# Patient Record
Sex: Male | Born: 1940 | Race: White | Hispanic: No | Marital: Married | Smoking: Never smoker
Health system: Southern US, Community
[De-identification: ages and names within clinical notes are randomized; demographics above are authoritative.]

## PROBLEM LIST (undated history)

## (undated) DIAGNOSIS — C4490 Unspecified malignant neoplasm of skin, unspecified: Secondary | ICD-10-CM

## (undated) DIAGNOSIS — E7800 Pure hypercholesterolemia, unspecified: Secondary | ICD-10-CM

## (undated) DIAGNOSIS — C189 Malignant neoplasm of colon, unspecified: Secondary | ICD-10-CM

## (undated) DIAGNOSIS — R195 Other fecal abnormalities: Secondary | ICD-10-CM

## (undated) DIAGNOSIS — I10 Essential (primary) hypertension: Secondary | ICD-10-CM

## (undated) DIAGNOSIS — E119 Type 2 diabetes mellitus without complications: Secondary | ICD-10-CM

## (undated) DIAGNOSIS — K219 Gastro-esophageal reflux disease without esophagitis: Secondary | ICD-10-CM

## (undated) HISTORY — PX: CARDIAC SURGERY: SHX584

## (undated) HISTORY — DX: Type 2 diabetes mellitus without complications: E11.9

## (undated) HISTORY — PX: CATARACT EXTRACTION: SUR2

## (undated) HISTORY — PX: TONSILLECTOMY: SUR1361

## (undated) HISTORY — PX: OTHER SURGICAL HISTORY: SHX169

## (undated) HISTORY — PX: SKIN CANCER EXCISION: SHX779

---

## 2000-11-06 HISTORY — PX: CORONARY ARTERY BYPASS GRAFT: SHX141

## 2011-05-28 ENCOUNTER — Emergency Department (HOSPITAL_COMMUNITY)
Admission: EM | Admit: 2011-05-28 | Discharge: 2011-05-28 | Disposition: A | Discharge status: Home / Self Care | Payer: Medicare Other | Attending: Emergency Medicine | Admitting: Emergency Medicine

## 2011-05-28 ENCOUNTER — Encounter: Payer: Self-pay | Admitting: *Deleted

## 2011-05-28 DIAGNOSIS — K219 Gastro-esophageal reflux disease without esophagitis: Secondary | ICD-10-CM | POA: Insufficient documentation

## 2011-05-28 DIAGNOSIS — I10 Essential (primary) hypertension: Secondary | ICD-10-CM | POA: Insufficient documentation

## 2011-05-28 DIAGNOSIS — N390 Urinary tract infection, site not specified: Secondary | ICD-10-CM | POA: Insufficient documentation

## 2011-05-28 DIAGNOSIS — Z85828 Personal history of other malignant neoplasm of skin: Secondary | ICD-10-CM | POA: Insufficient documentation

## 2011-05-28 DIAGNOSIS — Z794 Long term (current) use of insulin: Secondary | ICD-10-CM | POA: Insufficient documentation

## 2011-05-28 DIAGNOSIS — Z7982 Long term (current) use of aspirin: Secondary | ICD-10-CM | POA: Insufficient documentation

## 2011-05-28 DIAGNOSIS — C4490 Unspecified malignant neoplasm of skin, unspecified: Secondary | ICD-10-CM | POA: Insufficient documentation

## 2011-05-28 DIAGNOSIS — E7800 Pure hypercholesterolemia, unspecified: Secondary | ICD-10-CM | POA: Insufficient documentation

## 2011-05-28 DIAGNOSIS — E119 Type 2 diabetes mellitus without complications: Secondary | ICD-10-CM | POA: Insufficient documentation

## 2011-05-28 HISTORY — DX: Essential (primary) hypertension: I10

## 2011-05-28 HISTORY — DX: Pure hypercholesterolemia, unspecified: E78.00

## 2011-05-28 HISTORY — DX: Gastro-esophageal reflux disease without esophagitis: K21.9

## 2011-05-28 HISTORY — DX: Unspecified malignant neoplasm of skin, unspecified: C44.90

## 2011-05-28 LAB — URINALYSIS, ROUTINE W REFLEX MICROSCOPIC
Glucose, UA: 250 mg/dL — AB
Nitrite: POSITIVE — AB
Protein, ur: 100 mg/dL — AB
Urobilinogen, UA: 4 mg/dL — ABNORMAL HIGH (ref 0.0–1.0)

## 2011-05-28 LAB — GLUCOSE, CAPILLARY: Glucose-Capillary: 228 mg/dL — ABNORMAL HIGH (ref 70–99)

## 2011-05-28 LAB — URINE MICROSCOPIC-ADD ON

## 2011-05-28 MED ORDER — CIPROFLOXACIN HCL 250 MG PO TABS
500.0000 mg | ORAL_TABLET | Freq: Once | ORAL | Status: AC
  Administered 2011-05-28: 500 mg via ORAL
  Filled 2011-05-28: qty 2

## 2011-05-28 MED ORDER — CIPROFLOXACIN HCL 500 MG PO TABS: 500.0000 mg | ORAL_TABLET | Freq: Two times a day (BID) | ORAL | Status: AC

## 2011-05-28 NOTE — ED Notes (Signed)
Urinary frequency with lower abd pain that started yesterday afternoon.  Denies burning with urination/v/n.

## 2011-05-28 NOTE — ED Provider Notes (Signed)
History     Chief Complaint  Patient presents with  . Urinary Frequency   HPI Comments: Vision presents with gradual onset of urinary frequency over 3 days. This has been consistent over 3 days, gradually worsening, associated with mild suprapubic tenderness yesterday. Nothing makes this better or worse. He has no associated flank pain, back pain, nausea, fevers. He does have history of prior urine infections in the past. He has no symptoms at this time.  Patient is a 70 y.o. male presenting with frequency. The history is provided by the patient and the spouse.  Urinary Frequency    Past Medical History  Diagnosis Date  . Hypertension   . High cholesterol   . Diabetes mellitus   . Acid reflux   . Skin cancer     to nose    Past Surgical History  Procedure Date  . Cardiac surgery   . Tonsillectomy     No family history on file.  History  Substance Use Topics  . Smoking status: Never Smoker   . Smokeless tobacco: Not on file  . Alcohol Use: No      Review of Systems  Constitutional: Negative for fever and chills.  Genitourinary: Positive for frequency. Negative for dysuria, hematuria, flank pain, discharge, penile swelling, scrotal swelling, penile pain and testicular pain.    Physical Exam  BP 136/52  Pulse 73  Temp(Src) 99 F (37.2 C) (Oral)  Resp 20  Ht 5\' 10"  (1.778 m)  Wt 198 lb (89.812 kg)  BMI 28.41 kg/m2  SpO2 99%  Physical Exam  Constitutional: He appears well-developed and well-nourished. No distress.  HENT:  Head: Normocephalic and atraumatic.  Mouth/Throat: Oropharynx is clear and moist.  Eyes: Conjunctivae are normal. No scleral icterus.  Cardiovascular: Normal rate, regular rhythm and normal heart sounds.   Pulmonary/Chest: Effort normal and breath sounds normal. No respiratory distress. He has no wheezes. He has no rales.  Abdominal: Soft. There is no tenderness. There is no CVA tenderness.  Musculoskeletal: He exhibits no edema and no  tenderness.  Skin: No rash noted. He is not diaphoretic.    ED Course  Procedures  MDM Urinalysis reviewed and shows signs of urinary infection with hematuria, white blood cells, many bacteria. Antibiotics given in the emergency department. Patient appears nontoxic, taking oral medications without vomiting, normal vital signs.  Results for orders placed during the hospital encounter of 05/28/11  GLUCOSE, CAPILLARY      Component Value Range   Glucose-Capillary 228 (*) 70 - 99 (mg/dL)  URINALYSIS, ROUTINE W REFLEX MICROSCOPIC      Component Value Range   Color, Urine ORANGE (*) YELLOW    Appearance HAZY (*) CLEAR    Specific Gravity, Urine >1.030 (*) 1.005 - 1.030    pH 5.0  5.0 - 8.0    Glucose, UA 250 (*) NEGATIVE (mg/dL)   Hgb urine dipstick LARGE (*) NEGATIVE    Bilirubin Urine SMALL (*) NEGATIVE    Ketones, ur TRACE (*) NEGATIVE (mg/dL)   Protein, ur 956 (*) NEGATIVE (mg/dL)   Urobilinogen, UA 4.0 (*) 0.0 - 1.0 (mg/dL)   Nitrite POSITIVE (*) NEGATIVE    Leukocytes, UA SMALL (*) NEGATIVE   URINE MICROSCOPIC-ADD ON      Component Value Range   WBC, UA 21-50  <3 (WBC/hpf)   RBC / HPF TOO NUMEROUS TO COUNT  <3 (RBC/hpf)   Bacteria, UA MANY (*) RARE           Oris Drone  Hyacinth Meeker, MD 05/28/11 [phone]

## 2011-05-29 NOTE — ED Notes (Signed)
Misty Stanley, Pharmacist at [facility] in [place] called to question cipro prescription, states that there was not signature on prescription, spoke with emergency room edp  Dr. Estell Harpin who advised to fill prescription for cipro as written

## 2011-06-03 ENCOUNTER — Encounter (HOSPITAL_COMMUNITY): Payer: Self-pay | Admitting: Emergency Medicine

## 2011-06-03 ENCOUNTER — Emergency Department (HOSPITAL_COMMUNITY): Payer: Medicare Other

## 2011-06-03 ENCOUNTER — Emergency Department (HOSPITAL_COMMUNITY)
Admission: EM | Admit: 2011-06-03 | Discharge: 2011-06-03 | Disposition: A | Discharge status: Home / Self Care | Payer: Medicare Other | Attending: Emergency Medicine | Admitting: Emergency Medicine

## 2011-06-03 DIAGNOSIS — R7309 Other abnormal glucose: Secondary | ICD-10-CM | POA: Insufficient documentation

## 2011-06-03 DIAGNOSIS — N390 Urinary tract infection, site not specified: Secondary | ICD-10-CM

## 2011-06-03 DIAGNOSIS — R1909 Other intra-abdominal and pelvic swelling, mass and lump: Secondary | ICD-10-CM | POA: Insufficient documentation

## 2011-06-03 LAB — URINALYSIS, ROUTINE W REFLEX MICROSCOPIC
Glucose, UA: >1000 mg/dL — AB
Leukocytes, UA: NEGATIVE
Specific Gravity, Urine: 1.025 (ref 1.005–1.030)
pH: 5.5 (ref 5.0–8.0)

## 2011-06-03 LAB — URINE MICROSCOPIC-ADD ON

## 2011-06-03 MED ORDER — HYDROCODONE-ACETAMINOPHEN 5-325 MG PO TABS: 1.0000 | ORAL_TABLET | Freq: Four times a day (QID) | ORAL | Status: AC | PRN

## 2011-06-03 MED ORDER — HYDROCODONE-ACETAMINOPHEN 5-325 MG PO TABS
20.0000 | ORAL_TABLET | Freq: Four times a day (QID) | ORAL | Status: AC | PRN
Start: 2011-06-03 — End: 2011-06-13

## 2011-06-03 MED ORDER — CEFTRIAXONE SODIUM 1 G IJ SOLR
1.0000 g | Freq: Once | INTRAMUSCULAR | Status: AC
  Administered 2011-06-03: 1 g via INTRAMUSCULAR
  Filled 2011-06-03: qty 1

## 2011-06-03 NOTE — ED Provider Notes (Signed)
Scribed for Dr. Estell Harpin, the patient was seen in room 17. This chart was scribed by Hillery Hunter. This patient's care was started at 18:46 (in room with patient).    History     Chief Complaint  Patient presents with  . Groin Swelling  . Urinary Tract Infection  . Hyperglycemia   Patient is a 70 y.o. male presenting with urinary tract infection. The history is provided by the patient.  Urinary Tract Infection Chronicity: patient was seen for these symptoms six days aog and given Cipro at that time without improvement in symptoms. The current episode started more than 1 week ago. The problem occurs constantly. The problem has not changed since onset.Associated symptoms include abdominal pain (right groin pain). Pertinent negatives include no chest pain, no headaches and no shortness of breath.   Patient complains of symptoms he associates with a UTI for which he was seen and diagnosed here six days ago. He states that the symptoms are unchanged and which include right groin pain that is intermittent and associated with certain body positions. He denies dysuria and has been taking the Cipro for which he was prescribed as directed. Patient states he saw his PCP Dr. Catalina Pizza yesterday but that his symptoms have worsened today so he decided to come back in to be seen today at the ER.  Past Medical History  Diagnosis Date  . Hypertension   . High cholesterol   . Diabetes mellitus   . Acid reflux   . Skin cancer     to nose    Past Surgical History  Procedure Date  . Cardiac surgery   . Tonsillectomy   . Skin cancer excision     Family History  Problem Relation Age of Onset  . Stroke Mother   . Heart failure Father   . Heart failure Brother     History  Substance Use Topics  . Smoking status: Never Smoker   . Smokeless tobacco: Never Used  . Alcohol Use: No      Review of Systems  Constitutional: Negative for fatigue.  HENT: Negative for congestion, sinus  pressure and ear discharge.   Eyes: Negative for discharge.  Respiratory: Negative for cough and shortness of breath.   Cardiovascular: Negative for chest pain.  Gastrointestinal: Positive for abdominal pain (right groin pain). Negative for diarrhea.  Genitourinary: Positive for testicular pain (right groin). Negative for dysuria (denies pain with urination), frequency, hematuria, penile swelling, difficulty urinating and genital sores.  Musculoskeletal: Negative for back pain.  Skin: Negative for rash.  Neurological: Negative for seizures and headaches.  Hematological: Negative.   Psychiatric/Behavioral: Negative for hallucinations.  All other systems reviewed and are negative.    Physical Exam  BP 130/60  Pulse 96  Temp(Src) 99.1 F (37.3 C) (Oral)  Resp 18  Ht 5\' 10"  (1.778 m)  Wt 193 lb (87.544 kg)  BMI 27.69 kg/m2  SpO2 96%  Physical Exam  Constitutional: He is oriented to person, place, and time. He appears well-developed. No distress.  HENT:  Head: Normocephalic and atraumatic.  Eyes: Conjunctivae and EOM are normal. No scleral icterus.  Neck: Neck supple. No thyromegaly present.  Cardiovascular: Normal rate and regular rhythm.  Exam reveals no gallop and no friction rub.   No murmur heard. Pulmonary/Chest: No stridor. He has no wheezes. He has no rales. He exhibits no tenderness.  Abdominal: He exhibits no distension. There is tenderness (described in GU). There is no rebound.  Genitourinary: Penis normal. Right  testis shows swelling (possible hernia appreciated) and tenderness (right groin). Right testis shows no mass. Uncircumcised.  Musculoskeletal: Normal range of motion. He exhibits no edema.  Lymphadenopathy:    He has no cervical adenopathy.  Neurological: He is oriented to person, place, and time. Coordination normal.  Skin: No rash noted. No erythema.  Psychiatric: He has a normal mood and affect. His behavior is normal.    OTHER DATA REVIEWED: Nursing  notes, vital signs, and past medical records reviewed including prior visit 6 days ago.   DIAGNOSTIC STUDIES: Oxygen Saturation is 99% on RA, normal by my interpretation.     LABS / RADIOLOGY:  Results for orders placed during the hospital encounter of 06/03/11  GLUCOSE, CAPILLARY      Component Value Range   Glucose-Capillary 270 (*) 70 - 99 (mg/dL)   Comment 1 Documented in Chart     Comment 2 Notify RN    URINALYSIS, ROUTINE W REFLEX MICROSCOPIC      Component Value Range   Color, Urine YELLOW  YELLOW    Appearance HAZY (*) CLEAR    Specific Gravity, Urine 1.025  1.005 - 1.030    pH 5.5  5.0 - 8.0    Glucose, UA >1000 (*) NEGATIVE (mg/dL)   Hgb urine dipstick SMALL (*) NEGATIVE    Bilirubin Urine NEGATIVE  NEGATIVE    Ketones, ur NEGATIVE  NEGATIVE (mg/dL)   Protein, ur 30 (*) NEGATIVE (mg/dL)   Urobilinogen, UA 0.2  0.0 - 1.0 (mg/dL)   Nitrite NEGATIVE  NEGATIVE    Leukocytes, UA NEGATIVE  NEGATIVE   URINE MICROSCOPIC-ADD ON      Component Value Range   WBC, UA 21-50  <3 (WBC/hpf)   RBC / HPF 7-10  <3 (RBC/hpf)   Bacteria, UA MANY (*) RARE      ED COURSE / COORDINATION OF CARE: 18:53. Ordered CT abdomen/pelvis without contrast, urinalysis, fingerstick glucose 21:19. Discussed test findings with patient and spouse at bedside at this time. Discussed plan of care including Rx and recommended f/u.   MDM: Differential Diagnosis: Inguinal Hernia, UTI   IMPRESSION: Diagnoses that have been ruled out:  Diagnoses that are still under consideration:  Final diagnoses:  Urinary tract infection     PLAN: Discharge The patient is to return the emergency department if there is any worsening of symptoms. I have reviewed the discharge instructions with the patient and family.   CONDITION ON DISCHARGE: Stable   MEDICATIONS GIVEN IN THE E.D.  Medications  insulin aspart (NOVOLOG FLEXPEN) 100 UNIT/ML injection (not administered)  cefTRIAXone (ROCEPHIN) injection 1 g  (not administered)     DISCHARGE MEDICATIONS: New Prescriptions   No medications on file    Pt with continued groin pain and uti symptoms  Procedures none    I personally performed the services described in this documentation, which was scribed in my presence. The recorded information has been reviewed and considered. Benny Lennert, MD    Benny Lennert, MD 06/03/11 [phone]

## 2011-06-03 NOTE — ED Notes (Signed)
Pt c/o UTI, diagnosed here last Sat. Urinary urgency. No blood in urine as far as pt has noticed. R Testicular swelling. BS at last check 408 ([phone]).

## 2011-06-06 LAB — URINE CULTURE

## 2011-06-08 NOTE — ED Notes (Signed)
Chart sent to EDP office for review of urine culture 

## 2011-06-09 NOTE — ED Notes (Signed)
Keflex 500 mg PO, QID x 7 days, called into [phone] [facility] in [place].

## 2011-06-15 NOTE — ED Notes (Signed)
Pt states doing well and is already taking a red pill called keflex. Instructed to f/u with his MD or return to ED as needed.

## 2011-10-01 ENCOUNTER — Emergency Department (HOSPITAL_COMMUNITY)
Admission: EM | Admit: 2011-10-01 | Discharge: 2011-10-01 | Disposition: A | Discharge status: Home / Self Care | Payer: Medicare Other | Attending: Emergency Medicine | Admitting: Emergency Medicine

## 2011-10-01 ENCOUNTER — Emergency Department (HOSPITAL_COMMUNITY): Payer: Medicare Other

## 2011-10-01 ENCOUNTER — Encounter (HOSPITAL_COMMUNITY): Payer: Self-pay | Admitting: *Deleted

## 2011-10-01 DIAGNOSIS — R350 Frequency of micturition: Secondary | ICD-10-CM | POA: Insufficient documentation

## 2011-10-01 DIAGNOSIS — N5089 Other specified disorders of the male genital organs: Secondary | ICD-10-CM | POA: Insufficient documentation

## 2011-10-01 DIAGNOSIS — Z794 Long term (current) use of insulin: Secondary | ICD-10-CM | POA: Insufficient documentation

## 2011-10-01 DIAGNOSIS — I10 Essential (primary) hypertension: Secondary | ICD-10-CM | POA: Insufficient documentation

## 2011-10-01 DIAGNOSIS — N451 Epididymitis: Secondary | ICD-10-CM

## 2011-10-01 DIAGNOSIS — Z79899 Other long term (current) drug therapy: Secondary | ICD-10-CM | POA: Insufficient documentation

## 2011-10-01 DIAGNOSIS — E7800 Pure hypercholesterolemia, unspecified: Secondary | ICD-10-CM | POA: Insufficient documentation

## 2011-10-01 DIAGNOSIS — E119 Type 2 diabetes mellitus without complications: Secondary | ICD-10-CM | POA: Insufficient documentation

## 2011-10-01 LAB — URINALYSIS, ROUTINE W REFLEX MICROSCOPIC
Bilirubin Urine: NEGATIVE
Glucose, UA: 100 mg/dL — AB
Leukocytes, UA: NEGATIVE
Nitrite: NEGATIVE
Protein, ur: 30 mg/dL — AB
Specific Gravity, Urine: >1.03 — ABNORMAL HIGH (ref 1.005–1.030)
Urobilinogen, UA: 0.2 mg/dL (ref 0.0–1.0)
pH: 5.5 (ref 5.0–8.0)

## 2011-10-01 LAB — KETONES, URINE

## 2011-10-01 LAB — CBC
HCT: 35.6 % — ABNORMAL LOW (ref 39.0–52.0)
MCV: 84.4 fL (ref 78.0–100.0)
Platelets: 115 10*3/uL — ABNORMAL LOW (ref 150–400)
RBC: 4.22 MIL/uL (ref 4.22–5.81)
RDW: 14.3 % (ref 11.5–15.5)
WBC: 6.3 10*3/uL (ref 4.0–10.5)

## 2011-10-01 LAB — BASIC METABOLIC PANEL
CO2: 27 mEq/L (ref 19–32)
Chloride: 97 mEq/L (ref 96–112)
Glucose, Bld: 178 mg/dL — ABNORMAL HIGH (ref 70–99)
Sodium: 132 mEq/L — ABNORMAL LOW (ref 135–145)

## 2011-10-01 LAB — DIFFERENTIAL
Basophils Absolute: 0 10*3/uL (ref 0.0–0.1)
Eosinophils Relative: 0 % (ref 0–5)
Lymphocytes Relative: 11 % — ABNORMAL LOW (ref 12–46)
Lymphs Abs: 0.7 10*3/uL (ref 0.7–4.0)
Neutro Abs: 4.9 10*3/uL (ref 1.7–7.7)

## 2011-10-01 LAB — GLUCOSE, CAPILLARY: Glucose-Capillary: 171 mg/dL — ABNORMAL HIGH (ref 70–99)

## 2011-10-01 LAB — URINE MICROSCOPIC-ADD ON

## 2011-10-01 MED ORDER — LIDOCAINE HCL (PF) 1 % IJ SOLN
2.0000 mL | Freq: Once | INTRAMUSCULAR | Status: AC
  Administered 2011-10-01: 2 mL via INTRADERMAL
  Filled 2011-10-01 (×2): qty 5

## 2011-10-01 MED ORDER — CIPROFLOXACIN HCL 500 MG PO TABS: 500.0000 mg | ORAL_TABLET | Freq: Two times a day (BID) | ORAL | Status: AC

## 2011-10-01 MED ORDER — CEFTRIAXONE SODIUM 1 G IJ SOLR
1.0000 g | Freq: Once | INTRAMUSCULAR | Status: AC
  Administered 2011-10-01: 1 g via INTRAMUSCULAR
  Filled 2011-10-01: qty 10

## 2011-10-01 MED ORDER — CIPROFLOXACIN HCL 250 MG PO TABS
500.0000 mg | ORAL_TABLET | Freq: Once | ORAL | Status: AC
  Administered 2011-10-01: 500 mg via ORAL
  Filled 2011-10-01: qty 2

## 2011-10-01 NOTE — ED Provider Notes (Signed)
I have personally performed and participated in all the services and procedures documented herein. I have reviewed the findings with the patient. pt with testicular pain, mild swelling, no obv infectious process, will get cath and U/S to r/o mass, infection, torsion.  No abd tenderness, no obv hernia.  Afebrile, vitals signs are not worrisome.    Gavin Pound. Oletta Lamas, MD 10/01/11 1217

## 2011-10-01 NOTE — ED Notes (Signed)
Chills, urinary retention, and left testicle swelling that started yesterday.  Generalized abd pain that started this morning.  Denies n/v/d.

## 2011-10-01 NOTE — ED Notes (Signed)
Pt voided 50 cc prior to bladder being scanned . Scanner only showed 11 cc left in bladder. edpa aware

## 2011-10-01 NOTE — ED Provider Notes (Signed)
History     CSN: [id number] Arrival date & time: 10/01/2011 10:04 AM   First MD Initiated Contact with Patient 10/01/11 [phone]      Chief Complaint  Patient presents with  . Groin Swelling  . Urinary Frequency    (Consider location/radiation/quality/duration/timing/severity/associated sxs/prior treatment) HPI Comments: Has had the urge to urinate since yest but only small amounts come out.  No prior history of retention.  Not taking any med prescribed or OTC meds.  No h/o BPH.  Patient is a 70 y.o. male presenting with frequency. The history is provided by the patient. No language interpreter was used.  Urinary Frequency This is a new problem. The current episode started yesterday. The problem occurs constantly. The problem has been unchanged. Pertinent negatives include no abdominal pain, chills, coughing, fatigue, fever, myalgias, nausea, vomiting or weakness. The symptoms are aggravated by nothing. He has tried nothing for the symptoms.    Past Medical History  Diagnosis Date  . Hypertension   . High cholesterol   . Diabetes mellitus   . Acid reflux   . Skin cancer     to nose    Past Surgical History  Procedure Date  . Cardiac surgery   . Tonsillectomy   . Skin cancer excision     Family History  Problem Relation Age of Onset  . Stroke Mother   . Heart failure Father   . Heart failure Brother     History  Substance Use Topics  . Smoking status: Never Smoker   . Smokeless tobacco: Never Used  . Alcohol Use: No      Review of Systems  Constitutional: Negative for fever, chills and fatigue.  Respiratory: Negative for cough.   Gastrointestinal: Negative for nausea, vomiting and abdominal pain.  Genitourinary: Positive for frequency. Negative for dysuria, urgency, hematuria, flank pain, scrotal swelling and testicular pain.  Musculoskeletal: Negative for myalgias.  Neurological: Negative for weakness.    Allergies  Colace; Neosporin; and Percocet  Home  Medications   Current Outpatient Rx  Name Route Sig Dispense Refill  . ASPIRIN 81 MG PO TABS Oral Take 81 mg by mouth daily.      Marland Kitchen DICLOFENAC SODIUM 75 MG PO TBEC Oral Take 75 mg by mouth 2 (two) times daily.      . INSULIN ASPART 100 UNIT/ML [place] SOLN Subcutaneous Inject 5 Units into the skin daily as needed. FOR SUGAR LEVELS ABOVE <250     . INSULIN DETEMIR 100 UNIT/ML [place] SOLN Subcutaneous Inject 36 Units into the skin 2 (two) times daily.      Marland Kitchen METOPROLOL TARTRATE 50 MG PO TABS Oral Take 50 mg by mouth daily.     Marland Kitchen OMEPRAZOLE 20 MG PO CPDR Oral Take 20 mg by mouth at bedtime.     Marland Kitchen PIOGLITAZONE HCL-METFORMIN HCL 15-500 MG PO TABS Oral Take 1 tablet by mouth 2 (two) times daily with a meal.      . SIMVASTATIN 40 MG PO TABS Oral Take 40 mg by mouth at bedtime.       BP 138/58  Pulse 72  Temp(Src) 99.7 F (37.6 C) (Oral)  Resp 18  Ht 5\' 10"  (1.778 m)  Wt 187 lb (84.823 kg)  BMI 26.83 kg/m2  SpO2 99%  Physical Exam  Nursing note and vitals reviewed. Constitutional: He is oriented to person, place, and time. He appears well-developed and well-nourished.  HENT:  Head: Normocephalic and atraumatic.  Eyes: EOM are normal.  Neck: Normal range  of motion.  Cardiovascular: Normal rate, regular rhythm, normal heart sounds and intact distal pulses.   Pulmonary/Chest: Effort normal and breath sounds normal. No respiratory distress.  Abdominal: Soft. He exhibits no distension. There is no tenderness. Hernia confirmed negative in the right inguinal area and confirmed negative in the left inguinal area.  Genitourinary: Rectum normal, prostate normal and penis normal. Rectal exam shows no external hemorrhoid, no fissure, no mass, no tenderness and anal tone normal. Prostate is not enlarged and not tender. Right testis shows no mass, no swelling and no tenderness. Right testis is descended. Left testis shows swelling and tenderness. Left testis shows no mass. Left testis is descended. Uncircumcised.  No phimosis, paraphimosis, penile erythema or penile tenderness. No discharge found.       L scrotum looks and feels fuller than right..  L testicle is tender to palpation.  "it feels like i've sat on it".  Musculoskeletal: Normal range of motion.  Lymphadenopathy:       Right: No inguinal adenopathy present.       Left: No inguinal adenopathy present.  Neurological: He is alert and oriented to person, place, and time.  Skin: Skin is warm and dry.  Psychiatric: He has a normal mood and affect. Judgment normal.    ED Course  Procedures (including critical care time)  Labs Reviewed  GLUCOSE, CAPILLARY - Abnormal; Notable for the following:    Glucose-Capillary 171 (*)    All other components within normal limits  CBC  DIFFERENTIAL  BASIC METABOLIC PANEL  KETONES, URINE   No results found.   No diagnosis found.    MDM          Worthy Rancher, PA 10/01/11 1047

## 2011-10-01 NOTE — ED Notes (Signed)
Pt return from x-ray. To return for another x-ray

## 2011-10-03 LAB — URINE CULTURE: Culture  Setup Time: 201211252044

## 2011-10-04 NOTE — ED Notes (Signed)
+   urine Patient treated with cipro-sensitive to same-chart appended per protocol MD. 

## 2011-10-16 ENCOUNTER — Encounter (HOSPITAL_COMMUNITY): Payer: Self-pay | Admitting: Pharmacy Technician

## 2011-10-18 ENCOUNTER — Encounter (HOSPITAL_COMMUNITY)
Admission: RE | Admit: 2011-10-18 | Discharge: 2011-10-18 | Disposition: A | Discharge status: Home / Self Care | Payer: Medicare Other | Source: Ambulatory Visit | Attending: Urology | Admitting: Urology

## 2011-10-18 ENCOUNTER — Other Ambulatory Visit: Payer: Self-pay

## 2011-10-18 ENCOUNTER — Encounter (HOSPITAL_COMMUNITY): Payer: Self-pay

## 2011-10-18 LAB — BASIC METABOLIC PANEL
BUN: 20 mg/dL (ref 6–23)
CO2: 27 mEq/L (ref 19–32)
Calcium: 9.4 mg/dL (ref 8.4–10.5)
GFR calc non Af Amer: 58 mL/min — ABNORMAL LOW (ref >90)
Glucose, Bld: 188 mg/dL — ABNORMAL HIGH (ref 70–99)
Potassium: 4.2 mEq/L (ref 3.5–5.1)
Sodium: 139 mEq/L (ref 135–145)

## 2011-10-18 LAB — CBC
HCT: 36.5 % — ABNORMAL LOW (ref 39.0–52.0)
Hemoglobin: 12 g/dL — ABNORMAL LOW (ref 13.0–17.0)
MCH: 27.8 pg (ref 26.0–34.0)
MCHC: 32.9 g/dL (ref 30.0–36.0)
RBC: 4.31 MIL/uL (ref 4.22–5.81)

## 2011-10-18 LAB — SURGICAL PCR SCREEN: MRSA, PCR: NEGATIVE

## 2011-10-18 NOTE — Patient Instructions (Addendum)
19 Carlos Wolf  10/18/2011   Your procedure is scheduled on:   10/24/2011  Report to [facility] at   955  AM.  Call this number if you have problems the morning of surgery: [phone]   Remember:   Do not eat food:After Midnight.  May have clear liquids:until Midnight .  Clear liquids include soda, tea, black coffee, apple or grape juice, broth.  Take these medicines the morning of surgery with A SIP OF WATER: metoprolol,omeprazole. Take 1/2 Insulin dosage night before surgery.   Do not wear jewelry, make-up or nail polish.  Do not wear lotions, powders, or perfumes. You may wear deodorant.  Do not shave 48 hours prior to surgery.  Do not bring valuables to the hospital.  Contacts, dentures or bridgework may not be worn into surgery.  Leave suitcase in the car. After surgery it may be brought to your room.  For patients admitted to the hospital, checkout time is 11:00 AM the day of discharge.   Patients discharged the day of surgery will not be allowed to drive home.  Name and phone number of your driver: family  Special Instructions: CHG Shower Use Special Wash: 1/2 bottle night before surgery and 1/2 bottle morning of surgery.   Please read over the following fact sheets that you were given: Pain Booklet, MRSA Information and Surgical Site Infection Prevention Male Circumcision Male circumcision is a surgery to remove the foreskin of the penis or to cut the foreskin so the opening is larger. This is usually an elective procedure. Elective means the surgery is done when it is the best time for you. It may be done more urgently for medical reasons. One reason to have surgery right away is when the foreskin is so tight that it cannot be retracted. This is called phimosis. Other reasons include infection of the area under the foreskin or head of the penis (glans). When only the foreskin is cut, it is called a dorsal (on top of the foreskin) incision. This procedure leaves the entire foreskin  but makes the end of the foreskin looser so it can be pulled back over the head of the penis.  BEFORE THE PROCEDURE   Do not take aspirin or blood thinners for a week prior to surgery, or as your caregiver suggests.   Do not eat or drink anything after midnight the night before surgery, or as instructed.   Let your caregiver know if you develop a cold or other infection before surgery.   You should be present 1 hour before the procedure, or as directed.   Before the procedure, you will be washed and given a local anesthetic to make sure you feel no pain.  LET YOUR CAREGIVERS KNOW ABOUT:  Allergies.   Medications taken including herbs, eye drops, over the counter medications and creams.   Use of steroids (by mouth or creams).   Previous problems with anesthetics or Novocaine.   History of blood clots (thrombophlebitis).   History of bleeding or blood problems.   Previous surgery.   Other health problems.  RISKS AND COMPLICATIONS  The most common complications include:  Bleeding.   Infection.   Pain.   Urethral injury. The urethra is the opening on the end of the penis that carries your urine out.   Breaking open of the surgical wound from an unwanted erection after surgery can occur.  HOME CARE INSTRUCTIONS   After your circumcision, you may have some pain or discomfort for several days.  If pain medications were given by your caregiver, take them as instructed.   Do not take aspirin or nonsteroidal anti-inflammatory medications such as Advil and Motrin as these can increase the risk of bleeding.   Your caregiver may or may not have put a bandage on your penis. This bandage should stay on for 48 hours (2 days) or as instructed. Usually, the bandage falls off after days. If it does not, you can soak it off with warm, soapy water. No bandage is needed following this unless your caregiver tells you otherwise.   Do not get your wound wet for 2 days or as instructed.    After 2 days, you may take a shower or sponge bath. Clean your penis with warm soapy water. After you shower, gently pat your incision dry with a towel. Do not rub it. Clean around the foreskin regularly. For comfort, you may take warm water sitz baths 2-3 times daily for 20 minutes to soothe the affected area.   Antibiotics, anti-fungal, or cortisone creams or ointments may be used as directed.   All sexual activity should be avoided until your caregiver says it is OK.   Your caregiver may provide you with an ampule of amyl nitrate. This can be inhaled (breathed in) as directed to stop an erection that occurs during the recovery period. This can damage the surgical incision.   Serious infections may require medications which kill germs (antibiotics).   Take time off work or school as directed by your caregiver.   If you do heavy lifting or if your job keeps you seated and you are not able to move around freely for long periods, a week off may be necessary.   Avoid fast-moving or contact sports, cycling and swimming until your circumcision has fully healed. This may take 4-6 weeks.   Call your caregiver for problems which are not getting better in 2-3 days or which seem to be getting worse.  SEEK MEDICAL CARE IF YOU:  Have a fever of 102 F (38.9 C) or more.   Have redness or swelling around your wound.   Have yellow or green drainage coming from your wound.   Have any bleeding that does not stop when you put mild pressure on it.  SEEK IMMEDIATE MEDICAL CARE IF:   You are unable to urinate.   You have pain when passing urine.   You have increasing pain not controlled with medications.  Document Released: 11/12/2007 Document Revised: 07/05/2011 Document Reviewed: 11/12/2007 [facility] Patient Information 2012 [place], [place].PATIENT INSTRUCTIONS POST-ANESTHESIA  IMMEDIATELY FOLLOWING SURGERY:  Do not drive or operate machinery for the first twenty four hours after surgery.  Do  not make any important decisions for twenty four hours after surgery or while taking narcotic pain medications or sedatives.  If you develop intractable nausea and vomiting or a severe headache please notify your doctor immediately.  FOLLOW-UP:  Please make an appointment with your surgeon as instructed. You do not need to follow up with anesthesia unless specifically instructed to do so.  WOUND CARE INSTRUCTIONS (if applicable):  Keep a dry clean dressing on the anesthesia/puncture wound site if there is drainage.  Once the wound has quit draining you may leave it open to air.  Generally you should leave the bandage intact for twenty four hours unless there is drainage.  If the epidural site drains for more than 36-48 hours please call the anesthesia department.  QUESTIONS?:  Please feel free to call your physician or  the hospital operator if you have any questions, and they will be happy to assist you.     [facility] Anesthesia Department [address] [place] [phone]

## 2011-10-24 ENCOUNTER — Other Ambulatory Visit: Payer: Self-pay | Admitting: Urology

## 2011-10-24 ENCOUNTER — Ambulatory Visit (HOSPITAL_COMMUNITY)
Admission: RE | Admit: 2011-10-24 | Discharge: 2011-10-24 | Disposition: A | Discharge status: Home / Self Care | Payer: Medicare Other | Source: Ambulatory Visit | Attending: Urology | Admitting: Urology

## 2011-10-24 ENCOUNTER — Encounter (HOSPITAL_COMMUNITY): Payer: Self-pay | Admitting: *Deleted

## 2011-10-24 ENCOUNTER — Encounter (HOSPITAL_COMMUNITY): Payer: Self-pay | Admitting: Anesthesiology

## 2011-10-24 ENCOUNTER — Encounter (HOSPITAL_COMMUNITY)
Admission: RE | Disposition: A | Discharge status: Home / Self Care | Payer: Self-pay | Source: Ambulatory Visit | Attending: Urology

## 2011-10-24 ENCOUNTER — Ambulatory Visit (HOSPITAL_COMMUNITY): Payer: Medicare Other | Admitting: Anesthesiology

## 2011-10-24 DIAGNOSIS — E119 Type 2 diabetes mellitus without complications: Secondary | ICD-10-CM | POA: Insufficient documentation

## 2011-10-24 DIAGNOSIS — Z01810 Encounter for preprocedural cardiovascular examination: Secondary | ICD-10-CM | POA: Insufficient documentation

## 2011-10-24 DIAGNOSIS — Z79899 Other long term (current) drug therapy: Secondary | ICD-10-CM | POA: Insufficient documentation

## 2011-10-24 DIAGNOSIS — I10 Essential (primary) hypertension: Secondary | ICD-10-CM | POA: Insufficient documentation

## 2011-10-24 DIAGNOSIS — Z01812 Encounter for preprocedural laboratory examination: Secondary | ICD-10-CM | POA: Insufficient documentation

## 2011-10-24 DIAGNOSIS — N400 Enlarged prostate without lower urinary tract symptoms: Secondary | ICD-10-CM | POA: Insufficient documentation

## 2011-10-24 DIAGNOSIS — N471 Phimosis: Secondary | ICD-10-CM | POA: Insufficient documentation

## 2011-10-24 DIAGNOSIS — Z794 Long term (current) use of insulin: Secondary | ICD-10-CM | POA: Insufficient documentation

## 2011-10-24 HISTORY — PX: CYSTOSCOPY: SHX5120

## 2011-10-24 HISTORY — PX: CIRCUMCISION: SHX1350

## 2011-10-24 SURGERY — CIRCUMCISION, ADULT
Anesthesia: Spinal | Site: Penis | Wound class: Clean Contaminated

## 2011-10-24 MED ORDER — MIDAZOLAM HCL 5 MG/5ML IJ SOLN
INTRAMUSCULAR | Status: DC | PRN
  Administered 2011-10-24: 2 mg via INTRAVENOUS

## 2011-10-24 MED ORDER — MIDAZOLAM HCL 2 MG/2ML IJ SOLN
INTRAMUSCULAR | Status: AC
  Administered 2011-10-24: 2 mg via INTRAVENOUS
  Filled 2011-10-24: qty 2

## 2011-10-24 MED ORDER — PROPOFOL 10 MG/ML IV EMUL
INTRAVENOUS | Status: DC | PRN
  Administered 2011-10-24: 35 ug/kg/min via INTRAVENOUS

## 2011-10-24 MED ORDER — MIDAZOLAM HCL 2 MG/2ML IJ SOLN
1.0000 mg | INTRAMUSCULAR | Status: DC | PRN
  Administered 2011-10-24: 2 mg via INTRAVENOUS

## 2011-10-24 MED ORDER — FENTANYL CITRATE 0.05 MG/ML IJ SOLN
INTRAMUSCULAR | Status: DC | PRN
  Administered 2011-10-24: 50 ug via INTRAVENOUS
  Administered 2011-10-24: 20 ug via INTRATHECAL

## 2011-10-24 MED ORDER — ONDANSETRON HCL 4 MG/2ML IJ SOLN: 4.0000 mg | Freq: Once | INTRAMUSCULAR | Status: DC | PRN

## 2011-10-24 MED ORDER — FENTANYL CITRATE 0.05 MG/ML IJ SOLN: 25.0000 ug | INTRAMUSCULAR | Status: DC | PRN

## 2011-10-24 MED ORDER — BUPIVACAINE HCL 0.75 % IJ SOLN
INTRAMUSCULAR | Status: DC | PRN
  Administered 2011-10-24: 13 mg via INTRATHECAL

## 2011-10-24 MED ORDER — MIDAZOLAM HCL 2 MG/2ML IJ SOLN
INTRAMUSCULAR | Status: AC
  Filled 2011-10-24: qty 2

## 2011-10-24 MED ORDER — MEPERIDINE HCL 50 MG PO TABS: 50.0000 mg | ORAL_TABLET | ORAL | Status: AC | PRN

## 2011-10-24 MED ORDER — LACTATED RINGERS IV SOLN
INTRAVENOUS | Status: DC
  Administered 2011-10-24: 10:00:00 via INTRAVENOUS

## 2011-10-24 MED ORDER — BUPIVACAINE HCL (PF) 0.5 % IJ SOLN
INTRAMUSCULAR | Status: AC
  Filled 2011-10-24: qty 30

## 2011-10-24 MED ORDER — FENTANYL CITRATE 0.05 MG/ML IJ SOLN
INTRAMUSCULAR | Status: AC
  Filled 2011-10-24: qty 2

## 2011-10-24 MED ORDER — DEXTROSE 50 % IV SOLN: 25.0000 mL | Freq: Once | INTRAVENOUS | Status: DC

## 2011-10-24 MED ORDER — BUPIVACAINE IN DEXTROSE 0.75-8.25 % IT SOLN
INTRATHECAL | Status: AC
  Filled 2011-10-24: qty 2

## 2011-10-24 MED ORDER — PROPOFOL 10 MG/ML IV EMUL
INTRAVENOUS | Status: AC
  Filled 2011-10-24: qty 20

## 2011-10-24 MED ORDER — DEXTROSE 50 % IV SOLN
INTRAVENOUS | Status: AC
  Administered 2011-10-24: 25 mL via INTRAVENOUS
  Filled 2011-10-24: qty 50

## 2011-10-24 MED ORDER — SODIUM CHLORIDE 0.9 % IR SOLN
Status: DC | PRN
  Administered 2011-10-24: 1000 mL
  Administered 2011-10-24: 3000 mL

## 2011-10-24 SURGICAL SUPPLY — 40 items
BAG DRAIN URO TABLE W/ADPT NS (DRAPE) IMPLANT
BAG HAMPER (MISCELLANEOUS) ×4 IMPLANT
BANDAGE CONFORM 2  STR LF (GAUZE/BANDAGES/DRESSINGS) ×4 IMPLANT
BANDAGE CONFORM 2X5YD N/S (GAUZE/BANDAGES/DRESSINGS) ×4 IMPLANT
BLADE SURG 15 STRL LF DISP TIS (BLADE) ×3 IMPLANT
BLADE SURG 15 STRL SS (BLADE) ×1
CATH FOLEY 2WAY SLVR  5CC 18FR (CATHETERS)
CATH FOLEY 2WAY SLVR 5CC 18FR (CATHETERS) IMPLANT
CLOTH BEACON ORANGE TIMEOUT ST (SAFETY) ×4 IMPLANT
COVER LIGHT HANDLE STERIS (MISCELLANEOUS) ×8 IMPLANT
DECANTER SPIKE VIAL GLASS SM (MISCELLANEOUS) ×4 IMPLANT
ELECT NEEDLE TIP 2.8 STRL (NEEDLE) ×4 IMPLANT
FORMALIN 10 PREFIL 120ML (MISCELLANEOUS) ×4 IMPLANT
GAUZE PETROLATUM 1 X8 (GAUZE/BANDAGES/DRESSINGS) ×4 IMPLANT
GAUZE VASELINE 1X8 (GAUZE/BANDAGES/DRESSINGS) ×4 IMPLANT
GLOVE BIO SURGEON STRL SZ7 (GLOVE) ×4 IMPLANT
GLOVE ECLIPSE 6.5 STRL STRAW (GLOVE) ×4 IMPLANT
GLOVE INDICATOR 7.0 STRL GRN (GLOVE) ×4 IMPLANT
GOWN STRL REIN XL XLG (GOWN DISPOSABLE) ×8 IMPLANT
IV NS IRRIG 3000ML ARTHROMATIC (IV SOLUTION) ×4 IMPLANT
KIT ROOM TURNOVER AP CYSTO (KITS) ×4 IMPLANT
MANIFOLD NEPTUNE II (INSTRUMENTS) ×4 IMPLANT
NEEDLE HYPO 25X1 1.5 SAFETY (NEEDLE) ×4 IMPLANT
NS IRRIG 1000ML POUR BTL (IV SOLUTION) ×4 IMPLANT
PACK CYSTO (CUSTOM PROCEDURE TRAY) IMPLANT
PACK MINOR (CUSTOM PROCEDURE TRAY) ×4 IMPLANT
PAD ARMBOARD 7.5X6 YLW CONV (MISCELLANEOUS) ×4 IMPLANT
SET BASIN LINEN APH (SET/KITS/TRAYS/PACK) ×4 IMPLANT
SOL PREP PROV IODINE SCRUB 4OZ (MISCELLANEOUS) IMPLANT
SUT CHROMIC 3 0 SH 27 (SUTURE) ×12 IMPLANT
SUT CHROMIC BN 1/2CIR 4-0 27IN (SUTURE) ×4 IMPLANT
SUT VICRYL AB 3 0 TIES (SUTURE) ×4 IMPLANT
SYR CONTROL 10ML LL (SYRINGE) ×4 IMPLANT
SYRINGE IRR TOOMEY STRL 70CC (SYRINGE) IMPLANT
TAPE SURG TRANSPORE 1 IN (GAUZE/BANDAGES/DRESSINGS) ×3 IMPLANT
TAPE SURGICAL TRANSPORE 1 IN (GAUZE/BANDAGES/DRESSINGS) ×1
TOWEL OR 17X26 4PK STRL BLUE (TOWEL DISPOSABLE) ×4 IMPLANT
TRAY FOLEY CATH 14FR (SET/KITS/TRAYS/PACK) ×4 IMPLANT
VALVE DISPOSABLE (MISCELLANEOUS) ×4 IMPLANT
WATER STERILE IRR 1000ML POUR (IV SOLUTION) ×4 IMPLANT

## 2011-10-24 NOTE — Brief Op Note (Signed)
10/24/2011  12:10 PM  PATIENT:  Carlos Wolf  70 y.o. male  PRE-OPERATIVE DIAGNOSIS:  Phimosis, Benign Prostatic Hypertrophy  POST-OPERATIVE DIAGNOSIS:  Severe Phimosis  PROCEDURE:  Procedure(s): CIRCUMCISION ADULT CYSTOSCOPY FLEXIBLE  SURGEON:  Surgeon(s): Ky Barban  PHYSICIAN ASSISTANT:   ASSISTANTS: none   ANESTHESIA:   spinal  EBL:  Total I/O In: 900 [I.V.:900] Out: 20 [Blood:20]  BLOOD ADMINISTERED:none  DRAINS: Urinary Catheter (Foley)   LOCAL MEDICATIONS USED:  NONE  SPECIMEN:  Source of Specimen:  penile skin  DISPOSITION OF SPECIMEN:  PATHOLOGY  COUNTS:  YES  TOURNIQUET:  * No tourniquets in log *  DICTATION: .Other Dictation: Dictation Number dictation [id number]  PLAN OF CARE: Discharge to home after PACU  PATIENT DISPOSITION:  PACU - hemodynamically stable.   Delay start of Pharmacological VTE agent (>24hrs) due to surgical blood loss or risk of bleeding:  {YES/

## 2011-10-24 NOTE — Progress Notes (Signed)
The pt was examined and there is no change in original h&p.

## 2011-10-24 NOTE — Anesthesia Preprocedure Evaluation (Signed)
Anesthesia Evaluation  Patient identified by MRN, date of birth, ID band Patient awake    Reviewed: Allergy & Precautions, H&P , NPO status , Patient's Chart, lab work & pertinent test results, reviewed documented beta blocker date and time   History of Anesthesia Complications Negative for: history of anesthetic complications  Airway Mallampati: II      Dental  (+) Teeth Intact   Pulmonary neg pulmonary ROS,  clear to auscultation        Cardiovascular hypertension, + CAD and + CABG Regular Normal    Neuro/Psych    GI/Hepatic GERD-  Medicated and Controlled,  Endo/Other  Diabetes mellitus-, Well Controlled, Type 2, Oral Hypoglycemic Agents and Insulin Dependent  Renal/GU      Musculoskeletal   Abdominal   Peds  Hematology   Anesthesia Other Findings   Reproductive/Obstetrics                           Anesthesia Physical Anesthesia Plan  ASA: III  Anesthesia Plan: Spinal   Post-op Pain Management:    Induction:   Airway Management Planned: Nasal Cannula  Additional Equipment:   Intra-op Plan:   Post-operative Plan:   Informed Consent: I have reviewed the patients History and Physical, chart, labs and discussed the procedure including the risks, benefits and alternatives for the proposed anesthesia with the patient or authorized representative who has indicated his/her understanding and acceptance.     Plan Discussed with:   Anesthesia Plan Comments:         Anesthesia Quick Evaluation

## 2011-10-24 NOTE — Transfer of Care (Signed)
Immediate Anesthesia Transfer of Care Note  Patient: Carlos Wolf  Procedure(s) Performed:  CIRCUMCISION ADULT - with repair of glans penis; CYSTOSCOPY FLEXIBLE  Patient Location: PACU  Anesthesia Type: Spinal  Level of Consciousness: awake, alert  and oriented  Airway & Oxygen Therapy: Patient Spontanous Breathing and Patient connected to face mask oxygen  Post-op Assessment: Report given to PACU RN  Post vital signs: Reviewed and stable  Complications: No apparent anesthesia complications

## 2011-10-24 NOTE — H&P (Signed)
Carlos Wolf                ACCOUNT NO.:  [id number]  MEDICAL RECORD NO.:  [id number]  LOCATION:                                 FACILITY:  PHYSICIAN:  Ky Barban, M.D.DATE OF BIRTH:  08-Mar-1941  DATE OF ADMISSION: DATE OF DISCHARGE:  LH                             HISTORY & PHYSICAL   [name] is a gentleman who was in the office on October 16, 2011, with lot of problem with voiding.  He is 70 years old, was found to have severe phimosis.  So, I have advised him to undergo circumcision and will do a cystoscopy at that time.  My feeling is that his phimosis is causing the difficulty voiding.  He is having recurrent episodes of painful swelling of his left testicle that could be the result of this phimosis also, but it was doing better with antibiotic on that day when I seen him.  So, he is coming tomorrow to undergo circumcision and cystoscopy under anesthesia as outpatient.  He denies any fever, chills, or gross hematuria.  He says his swelling on the testicle is going on and off for long time.  It goes away by taking antibiotics.  PAST MEDICAL HISTORY:  In 2003, he had open heart surgery.  He is not having any problems since then.  He denies any chest pain.  He denies having any other surgery.  He has insulin-dependent diabetes.  Also has hypertension, high cholesterol, and reflux.  He also has skin cancers on the nose.  FAMILY HISTORY:  No history of prostate cancer.  PERSONAL HISTORY:  He is married, lives with his wife, never smoked, drinks socially.  REVIEW OF SYSTEMS:  Unremarkable.  Denies any chest pain, orthopnea, PND, nausea, vomiting.  MEDICATION:  He is taking insulin, metoprolol, omeprazole, simvastatin, and aspirin.  PHYSICAL EXAMINATION:  GENERAL:  Well-nourished and well-developed male, not in acute distress, fully conscious, alert, oriented. VITAL SIGNS:  Blood pressure 141/68, temperature is 97.8. CENTRAL NERVOUS SYSTEM:  No gross  neurological deficit. HEAD, NECK, EYE ENT:  Negative. CHEST:  Symmetrical. HEART:  Regular sinus rhythm.  No murmur. ABDOMEN:  Soft, flat.  Liver, spleen, and kidneys are not palpable.  No CVA tenderness. GENITOURINARY:  External genitalia is uncircumcised, severe phimosis. His right epididymis is slightly tender.  Otherwise, testicles are normal.  IMPRESSION:  Recurrent left epididymo-orchitis, severe phimosis, rule out benign prostatic hypertrophy.  PLAN:  Cystoscopy and circumcision under anesthesia as outpatient.     Ky Barban, M.D.     MIJ/MEDQ  D:  10/23/2011  T:  10/24/2011  Job:  [id number]

## 2011-10-24 NOTE — Anesthesia Postprocedure Evaluation (Signed)
  Anesthesia Post-op Note  Patient: Carlos Wolf  Procedure(s) Performed:  CIRCUMCISION ADULT - with repair of glans penis; CYSTOSCOPY FLEXIBLE  Patient Location: PACU  Anesthesia Type: Spinal  Level of Consciousness: awake, alert  and oriented  Airway and Oxygen Therapy: Patient Spontanous Breathing and Patient connected to face mask oxygen  Post-op Pain: none  Post-op Assessment: Post-op Vital signs reviewed, Patient's Cardiovascular Status Stable, Respiratory Function Stable and No signs of Nausea or vomiting  Post-op Vital Signs: Reviewed and stable  Complications: No apparent anesthesia complications T12

## 2011-10-24 NOTE — Anesthesia Procedure Notes (Addendum)
Spinal  Patient location during procedure: OR Start time: 10/24/2011 10:50 AM Staffing CRNA/Resident: Glynn Octave Preanesthetic Checklist Completed: patient identified, site marked, surgical consent, pre-op evaluation, timeout performed, IV checked, risks and benefits discussed and monitors and equipment checked Spinal Block Patient position: left lateral decubitus Prep: Betadine Patient monitoring: heart rate, cardiac monitor, continuous pulse ox and blood pressure Approach: midline Location: L4-5 Injection technique: single-shot Needle Needle type: Spinocan  Needle gauge: 22 G Needle length: 9 cm Assessment Sensory level: T4 Additional Notes Tray lot [id number],  Exp.09/2012

## 2011-10-25 NOTE — Op Note (Signed)
NAMEBLAYKE, CORDREY                ACCOUNT NO.:  [id number]  MEDICAL RECORD NO.:  [id number]  LOCATION:  [facility]                          FACILITY:  [facility]  PHYSICIAN:  Ky Barban, M.D.DATE OF BIRTH:  1941-07-14  DATE OF PROCEDURE:  10/24/2011 DATE OF DISCHARGE:  10/24/2011                              OPERATIVE REPORT   PREOPERATIVE DIAGNOSIS:  Severe paraphimosis, difficulty to void.  POSTOPERATIVE DIAGNOSES:  Severe paraphimosis and moderate benign prostatic hypertrophy.  PROCEDURE:  Circumcision, repair of glans penis, and cystoscopy.  ANESTHESIA:  Spinal.  PROCEDURE:  The patient under spinal anesthesia, in supine position, usual prep and drape.  With some difficulty, I was able to find his meatal opening.  A dorsal slit was made, but the prepuce completely adherent with his glans penis.  With blunt and sharp dissection, the prepuce was separated from the glans penis and then a circumferential incision was made in the penile skin and it was done at the level of the corona and this sleeve of the skin was so isolated was simply excised. The redundant tissue was excised.  Bleeders were coagulated.  Then, circumcision incision was closed.  I have to put 1 side of the stitch through the penile shaft because there is no mucosa on the distal end. Circumcision incision was completely closed in usual fashion, then the glans penis.  The remaining part of the mucosa, which was adherent to the glans penis, I tried to shave it off.  In some area, the glans penis has to be cut, but it was closed with interrupted sutures of 4-0 chromic and some areas were simply fulgurated.  At the end, I introduced flexible cystoscope into the urethra.  I then inspected, looks normal. Prostatic urethra shows moderate enlargement of the prostate with minimum obstruction of the bladder neck.  The bladder is 2+ trabeculated.  No tumor, stone, foreign body or inflammation. Cystoscope was removed and I  left #14 Foley catheter in for drainage. The patient left the operating room in satisfactory condition.     Ky Barban, M.D.     MIJ/MEDQ  D:  10/24/2011  T:  10/25/2011  Job:  [id number]

## 2011-10-26 LAB — GLUCOSE, CAPILLARY: Glucose-Capillary: 97 mg/dL (ref 70–99)

## 2011-10-27 ENCOUNTER — Encounter (HOSPITAL_COMMUNITY): Payer: Self-pay | Admitting: Urology

## 2012-11-21 ENCOUNTER — Encounter (INDEPENDENT_AMBULATORY_CARE_PROVIDER_SITE_OTHER): Payer: Self-pay | Admitting: *Deleted

## 2012-11-28 ENCOUNTER — Other Ambulatory Visit (INDEPENDENT_AMBULATORY_CARE_PROVIDER_SITE_OTHER): Payer: Self-pay | Admitting: *Deleted

## 2012-11-28 ENCOUNTER — Ambulatory Visit (INDEPENDENT_AMBULATORY_CARE_PROVIDER_SITE_OTHER): Payer: MEDICARE | Admitting: Internal Medicine

## 2012-11-28 ENCOUNTER — Encounter (INDEPENDENT_AMBULATORY_CARE_PROVIDER_SITE_OTHER): Payer: Self-pay | Admitting: Internal Medicine

## 2012-11-28 ENCOUNTER — Telehealth (INDEPENDENT_AMBULATORY_CARE_PROVIDER_SITE_OTHER): Payer: Self-pay | Admitting: *Deleted

## 2012-11-28 VITALS — BP 134/52 | HR 56 | Temp 97.8°F | Ht 71.0 in | Wt 191.8 lb

## 2012-11-28 DIAGNOSIS — R195 Other fecal abnormalities: Secondary | ICD-10-CM

## 2012-11-28 DIAGNOSIS — K921 Melena: Secondary | ICD-10-CM

## 2012-11-28 DIAGNOSIS — Z1211 Encounter for screening for malignant neoplasm of colon: Secondary | ICD-10-CM

## 2012-11-28 DIAGNOSIS — K625 Hemorrhage of anus and rectum: Secondary | ICD-10-CM

## 2012-11-28 MED ORDER — PEG-KCL-NACL-NASULF-NA ASC-C 100 G PO SOLR: 1.0000 | Freq: Once | ORAL | Status: DC

## 2012-11-28 NOTE — Telephone Encounter (Signed)
Patient needs movi prep 

## 2012-11-28 NOTE — Progress Notes (Signed)
Subjective:     Patient ID: Carlos Wolf, male   DOB: 05-03-1941, 73 y.o.   MRN: [id number]  HPIReferred to our office for rectal bleeding. He tells me he had blood in his stool x 2 and then resolved. His stools were loose during that time. No further episodes of loose stools. He was not on any antibiotics.  Stools were black when he saw the blood.  Appetite is good. No weight loss. No dysphagia. No abdominal pain.  ASA 81mg  stopped when he had the rectal bleeding.  No other NSAIDs. 11/05/2012 H and H 12.9 and 38.6, MV 86.7, Platelet ct 174.    Review of Systems see hpi Current Outpatient Prescriptions  Medication Sig Dispense Refill  . insulin detemir (LEVEMIR) 100 UNIT/ML injection Inject 36 Units into the skin 2 (two) times daily.       . metoprolol (TOPROL-XL) 50 MG 24 hr tablet Take 50 mg by mouth at bedtime.        Marland Kitchen omeprazole (PRILOSEC) 20 MG capsule Take 20 mg by mouth at bedtime.       . pioglitazone-metformin (ACTOPLUS MET) 15-500 MG per tablet Take 1 tablet by mouth 2 (two) times daily.       . simvastatin (ZOCOR) 40 MG tablet Take 40 mg by mouth at bedtime.        Past Medical History  Diagnosis Date  . Hypertension   . High cholesterol   . Diabetes mellitus   . Acid reflux   . Skin cancer     to nose   Past Surgical History  Procedure Date  . Cardiac surgery   . Tonsillectomy   . Skin cancer excision     nose  . Coronary artery bypass graft 2002    cabg -5 vessels  . Circumcision 10/24/2011    Procedure: CIRCUMCISION ADULT;  Surgeon: Ky Barban;  Location: [facility];  Service: Urology;  Laterality: N/A;  with repair of glans penis  . Cystoscopy 10/24/2011    Procedure: CYSTOSCOPY FLEXIBLE;  Surgeon: Ky Barban;  Location: [facility];  Service: Urology;;   Allergies  Allergen Reactions  . Docusate Sodium Hives  . Neosporin (Neomycin-Bacitracin Zn-Polymyx) Itching  . Percocet (Oxycodone-Acetaminophen) Nausea And Vomiting       Objective:   Physical Exam Filed Vitals:   11/28/12 1010  BP: 134/52  Pulse: 56  Temp: 97.8 F (36.6 C)  Height: 5\' 11"  (1.803 m)  Weight: 191 lb 12.8 oz (87 kg)   Alert and oriented. Skin warm and dry. Oral mucosa is moist.   . Sclera anicteric, conjunctivae is pink. Thyroid not enlarged. No cervical lymphadenopathy. Lungs clear. Heart regular rate and rhythm.  Abdomen is soft. Bowel sounds are positive. No hepatomegaly. No abdominal masses felt. No tenderness.  No edema to lower extremities.   Stool brown and guaiac positive.     Assessment:   Hx of rectal bleeding and melena. PUD needs to be ruled out. Never undergone a colonoscopy. Colonic neoplasm needs to be ruled out. Colonic AVM, ulcer, polyp needs to be ruled out    Plan:    EGD/Colonoscopy. The risks and benefits such as perforation, bleeding, and infection were reviewed with the patient and is agreeable.

## 2012-11-28 NOTE — Patient Instructions (Addendum)
EGd/Colonoscopy. The risks and benefits such as perforation, bleeding, and infection were reviewed with the patient and is agreeable.

## 2012-12-03 ENCOUNTER — Encounter (HOSPITAL_COMMUNITY): Payer: Self-pay | Admitting: Pharmacy Technician

## 2012-12-11 ENCOUNTER — Encounter (INDEPENDENT_AMBULATORY_CARE_PROVIDER_SITE_OTHER): Payer: Self-pay

## 2012-12-13 ENCOUNTER — Encounter (HOSPITAL_COMMUNITY)
Admission: RE | Disposition: A | Discharge status: Home / Self Care | Payer: Self-pay | Source: Ambulatory Visit | Attending: Internal Medicine

## 2012-12-13 ENCOUNTER — Encounter (HOSPITAL_COMMUNITY): Payer: Self-pay | Admitting: *Deleted

## 2012-12-13 ENCOUNTER — Ambulatory Visit (HOSPITAL_COMMUNITY)
Admission: RE | Admit: 2012-12-13 | Discharge: 2012-12-13 | Disposition: A | Discharge status: Home / Self Care | Payer: MEDICARE | Source: Ambulatory Visit | Attending: Internal Medicine | Admitting: Internal Medicine

## 2012-12-13 DIAGNOSIS — K921 Melena: Secondary | ICD-10-CM

## 2012-12-13 DIAGNOSIS — K228 Other specified diseases of esophagus: Secondary | ICD-10-CM

## 2012-12-13 DIAGNOSIS — Z538 Procedure and treatment not carried out for other reasons: Secondary | ICD-10-CM | POA: Insufficient documentation

## 2012-12-13 DIAGNOSIS — K5730 Diverticulosis of large intestine without perforation or abscess without bleeding: Secondary | ICD-10-CM

## 2012-12-13 DIAGNOSIS — C20 Malignant neoplasm of rectum: Secondary | ICD-10-CM | POA: Insufficient documentation

## 2012-12-13 DIAGNOSIS — R195 Other fecal abnormalities: Secondary | ICD-10-CM

## 2012-12-13 DIAGNOSIS — K2270 Barrett's esophagus without dysplasia: Secondary | ICD-10-CM | POA: Insufficient documentation

## 2012-12-13 DIAGNOSIS — D129 Benign neoplasm of anus and anal canal: Secondary | ICD-10-CM

## 2012-12-13 DIAGNOSIS — D128 Benign neoplasm of rectum: Secondary | ICD-10-CM

## 2012-12-13 DIAGNOSIS — K219 Gastro-esophageal reflux disease without esophagitis: Secondary | ICD-10-CM

## 2012-12-13 DIAGNOSIS — Z01812 Encounter for preprocedural laboratory examination: Secondary | ICD-10-CM | POA: Insufficient documentation

## 2012-12-13 DIAGNOSIS — D131 Benign neoplasm of stomach: Secondary | ICD-10-CM

## 2012-12-13 HISTORY — PX: FLEXIBLE SIGMOIDOSCOPY: SHX5431

## 2012-12-13 HISTORY — PX: ESOPHAGOGASTRODUODENOSCOPY: SHX5428

## 2012-12-13 LAB — GLUCOSE, CAPILLARY
Glucose-Capillary: 146 mg/dL — ABNORMAL HIGH (ref 70–99)
Glucose-Capillary: 158 mg/dL — ABNORMAL HIGH (ref 70–99)

## 2012-12-13 SURGERY — EGD (ESOPHAGOGASTRODUODENOSCOPY)
Anesthesia: Moderate Sedation

## 2012-12-13 MED ORDER — MIDAZOLAM HCL 5 MG/5ML IJ SOLN
INTRAMUSCULAR | Status: AC
  Filled 2012-12-13: qty 10

## 2012-12-13 MED ORDER — BUTAMBEN-TETRACAINE-BENZOCAINE 2-2-14 % EX AERO
INHALATION_SPRAY | CUTANEOUS | Status: DC | PRN
  Administered 2012-12-13: 2 via TOPICAL

## 2012-12-13 MED ORDER — SODIUM CHLORIDE 0.45 % IV SOLN
INTRAVENOUS | Status: DC
  Administered 2012-12-13: 1000 mL via INTRAVENOUS

## 2012-12-13 MED ORDER — MEPERIDINE HCL 50 MG/ML IJ SOLN
INTRAMUSCULAR | Status: AC
  Filled 2012-12-13: qty 1

## 2012-12-13 MED ORDER — MEPERIDINE HCL 50 MG/ML IJ SOLN
INTRAMUSCULAR | Status: DC | PRN
  Administered 2012-12-13 (×3): 25 mg via INTRAVENOUS

## 2012-12-13 MED ORDER — MIDAZOLAM HCL 2 MG/2ML IJ SOLN
INTRAMUSCULAR | Status: AC
  Filled 2012-12-13: qty 4

## 2012-12-13 MED ORDER — MIDAZOLAM HCL 5 MG/5ML IJ SOLN
INTRAMUSCULAR | Status: DC | PRN
  Administered 2012-12-13 (×6): 2 mg via INTRAVENOUS

## 2012-12-13 MED ORDER — STERILE WATER FOR IRRIGATION IR SOLN
Status: DC | PRN
  Administered 2012-12-13: 12:00:00

## 2012-12-13 NOTE — H&P (Addendum)
Carlos Wolf is an 72 y.o. male.   Chief Complaint: Patient is here for EGD and colonoscopy. HPI: Patient is 72 year old Caucasian male who experienced 3 days of melena 6 weeks ago. He was seen by Dr. Dwana Melena and his aspirin was discontinued. He has been on PPI chronically for GERD. He does not recall the he had abdominal pain nausea or vomiting. He seen in the office recently and noted to have heme positive stools. He has never undergone screening for CRC. Family history is negative for colorectal carcinoma. His appetite is normal. He did manage to lose about 15 pounds as recommended by Dr. Margo Aye. His hemoglobin was 12.9 on 11/05/2012.  Past Medical History  Diagnosis Date  . Hypertension   . High cholesterol   . Diabetes mellitus   . Acid reflux   . Skin cancer     to nose    Past Surgical History  Procedure Date  . Cardiac surgery   . Tonsillectomy   . Skin cancer excision     nose  . Coronary artery bypass graft 2002    cabg -5 vessels  . Circumcision 10/24/2011    Procedure: CIRCUMCISION ADULT;  Surgeon: Ky Barban;  Location: [facility];  Service: Urology;  Laterality: N/A;  with repair of glans penis  . Cystoscopy 10/24/2011    Procedure: CYSTOSCOPY FLEXIBLE;  Surgeon: Ky Barban;  Location: [facility];  Service: Urology;;    Family History  Problem Relation Age of Onset  . Stroke Mother   . Heart failure Father   . Heart failure Brother   . Anesthesia problems Neg Hx   . Hypotension Neg Hx   . Malignant hyperthermia Neg Hx   . Pseudochol deficiency Neg Hx    Social History:  reports that he has never smoked. He has never used smokeless tobacco. He reports that he does not drink alcohol or use illicit drugs.  Allergies:  Allergies  Allergen Reactions  . Docusate Sodium Hives  . Neosporin (Neomycin-Bacitracin Zn-Polymyx) Itching  . Percocet (Oxycodone-Acetaminophen) Nausea And Vomiting    Medications Prior to Admission  Medication Sig Dispense Refill   . insulin detemir (LEVEMIR) 100 UNIT/ML injection Inject 46 Units into the skin 2 (two) times daily.       . metFORMIN (GLUCOPHAGE) 500 MG tablet Take 500 mg by mouth Twice daily.      Marland Kitchen omeprazole (PRILOSEC) 20 MG capsule Take 20 mg by mouth at bedtime.       . peg 3350 powder (MOVIPREP) 100 G SOLR Take 1 kit (100 g total) by mouth once.  1 kit  0  . pioglitazone (ACTOS) 30 MG tablet Take 30 mg by mouth at bedtime.      . simvastatin (ZOCOR) 40 MG tablet Take 40 mg by mouth at bedtime.         Results for orders placed during the hospital encounter of 12/13/12 (from the past 48 hour(s))  GLUCOSE, CAPILLARY     Status: Abnormal   Collection Time   12/13/12 12:13 PM      Component Value Range Comment   Glucose-Capillary 146 (*) 70 - 99 mg/dL    No results found.  Review of Systems  Eyes: Negative for blurred vision.    Blood pressure 187/86, pulse 101, temperature 97.6 F (36.4 C), temperature source Oral, resp. rate 20, SpO2 100.00%. Physical Exam  Constitutional: He appears well-developed and well-nourished.  HENT:  Mouth/Throat: Oropharynx is clear and moist.  Vertical scar over mid forehead.  Eyes: Conjunctivae normal are normal. No scleral icterus.  Neck: No thyromegaly present.  Cardiovascular: Normal rate and regular rhythm.   No murmur heard. Respiratory: Effort normal and breath sounds normal.  GI: Soft. He exhibits no distension and no mass. There is no tenderness.  Musculoskeletal: He exhibits no edema.  Lymphadenopathy:    He has no cervical adenopathy.  Neurological: He is alert.  Skin: Skin is warm and dry.     Assessment/Plan History of melena. Heme positive stools. EGD and colonoscopy.  Evian Derringer U 12/13/2012, 12:24 PM

## 2012-12-13 NOTE — Progress Notes (Signed)
Poor prep unable to complete exam

## 2012-12-13 NOTE — Op Note (Signed)
EGD PROCEDURE REPORT  PATIENT:  Carlos Wolf  MR#:  [id number] Birthdate:  Oct 28, 1941, 72 y.o., male Endoscopist:  Dr. Malissa Hippo, MD Referred By:  Dr. Timothy Lasso hall, MD Procedure Date: 12/13/2012  Procedure:   EGD & Colonoscopy(incomplete secondary to poor prep).  Indications:  Patient is 72 year old Caucasian male who experienced three days of melena about 6 weeks ago. He now has heme positive stools. He is undergoing diagnostic EGD and colonoscopy.            Informed Consent:  The risks, benefits, alternatives & imponderables which include, but are not limited to, bleeding, infection, perforation, drug reaction and potential missed lesion have been reviewed.  The potential for biopsy, lesion removal, esophageal dilation, etc. have also been discussed.  Questions have been answered.  All parties agreeable.  Please see history & physical in medical record for more information.  Medications:  Demerol 75 mg IV Versed 12 mg IV Cetacaine spray topically for oropharyngeal anesthesia  EGD  Description of procedure:  The endoscope was introduced through the mouth and advanced to the second portion of the duodenum without difficulty or limitations. The mucosal surfaces were surveyed very carefully during advancement of the scope and upon withdrawal.  Findings:  Esophagus:  Mucosa of the proximal and middle segment was normal. Distally there was single patch of salmon-colored mucosa about 12 x 6 mm. It was biopsied for histology. GEJ:  40 cm Stomach:  Stomach was empty and distended very well with insufflation. Folds in the proximal stomach are normal. Multiple hyperplastic-appearing polyps noted at gastric body and fundus no more than 3-4 mm. There was 5-6 mm polyp at antrum which was ablated via cold biopsy. Pyloric channel was patent and angularis was unremarkable. Duodenum:  Normal bulbar and post bulbar mucosa.  Therapeutic/Diagnostic Maneuvers Performed:  See  above  COLONOSCOPY Description of procedure:   Procedure performed in endoscopy suite. Patient's vital signs and O2 sat were monitored during the procedure and remained stable. Patient was placed in left lateral position and rectal examination performed. Flat lesion noted to fairly high along the right anterolateral wall. Scola space rectum and advanced sigmoid colon. He had  very poor prep. Therefore examination could not be completed. Scope was not retroflexed in the rectum.   Findings:  Few diverticula at sigmoid colon. Flat rectal ulcerated lesion with everted margins about 4 cm in diameter.  Therapeutic/Diagnostic Maneuvers Performed:  Multiple biopsies taken from rectal lesion.  Complications:  None  Cecal Withdrawal Time:  NA minutes  Impression:  EGD findings. Single patch of salmon-colored mucosa at distal esophagus suspicious first short segment Barrett's. Biopsy taken. Multiple polyps at gastric body and fundus along with one at antrum. Antral polyp was ablated via cold biopsy. Colonoscopy findings. Examination Limited because of poor prep. Flat ulcerated lesion with everted margins noted at rectum also palpable on rectal exam. It is located along right anterolateral wall and suspicious for rectal carcinoma. Sigmoid diverticulosis.   Recommendations:  Standard instructions given. I would be contacting patient with biopsy results. Will reschedule him for colonoscopy next week following two day prep.  Amarachi Kotz U  12/13/2012 1:10 PM  CC: Dr. Dwana Melena, MD & Dr. Bonnetta Barry ref. provider found

## 2012-12-16 ENCOUNTER — Encounter (INDEPENDENT_AMBULATORY_CARE_PROVIDER_SITE_OTHER): Payer: Self-pay | Admitting: *Deleted

## 2012-12-16 ENCOUNTER — Telehealth (INDEPENDENT_AMBULATORY_CARE_PROVIDER_SITE_OTHER): Payer: Self-pay | Admitting: *Deleted

## 2012-12-16 ENCOUNTER — Other Ambulatory Visit (INDEPENDENT_AMBULATORY_CARE_PROVIDER_SITE_OTHER): Payer: Self-pay | Admitting: *Deleted

## 2012-12-16 DIAGNOSIS — C20 Malignant neoplasm of rectum: Secondary | ICD-10-CM

## 2012-12-16 MED ORDER — PEG 3350-KCL-NA BICARB-NACL 420 G PO SOLR: 4000.0000 mL | Freq: Once | ORAL | Status: DC

## 2012-12-16 NOTE — Telephone Encounter (Signed)
Patient needs trilyte 

## 2012-12-17 ENCOUNTER — Encounter (HOSPITAL_COMMUNITY): Payer: Self-pay | Admitting: Internal Medicine

## 2012-12-17 ENCOUNTER — Encounter (HOSPITAL_COMMUNITY): Payer: Self-pay | Admitting: Pharmacy Technician

## 2012-12-24 ENCOUNTER — Encounter (INDEPENDENT_AMBULATORY_CARE_PROVIDER_SITE_OTHER): Payer: Self-pay | Admitting: *Deleted

## 2012-12-26 MED ORDER — SODIUM CHLORIDE 0.45 % IV SOLN: INTRAVENOUS | Status: DC

## 2012-12-27 ENCOUNTER — Ambulatory Visit (HOSPITAL_COMMUNITY)
Admission: RE | Admit: 2012-12-27 | Discharge: 2012-12-27 | Disposition: A | Discharge status: Home / Self Care | Payer: MEDICARE | Source: Ambulatory Visit | Attending: Internal Medicine | Admitting: Internal Medicine

## 2012-12-27 ENCOUNTER — Encounter (HOSPITAL_COMMUNITY): Payer: Self-pay | Admitting: *Deleted

## 2012-12-27 ENCOUNTER — Encounter (HOSPITAL_COMMUNITY)
Admission: RE | Disposition: A | Discharge status: Home / Self Care | Payer: Self-pay | Source: Ambulatory Visit | Attending: Internal Medicine

## 2012-12-27 DIAGNOSIS — C20 Malignant neoplasm of rectum: Secondary | ICD-10-CM | POA: Insufficient documentation

## 2012-12-27 DIAGNOSIS — K5730 Diverticulosis of large intestine without perforation or abscess without bleeding: Secondary | ICD-10-CM | POA: Insufficient documentation

## 2012-12-27 DIAGNOSIS — C218 Malignant neoplasm of overlapping sites of rectum, anus and anal canal: Secondary | ICD-10-CM

## 2012-12-27 DIAGNOSIS — Q438 Other specified congenital malformations of intestine: Secondary | ICD-10-CM

## 2012-12-27 DIAGNOSIS — E119 Type 2 diabetes mellitus without complications: Secondary | ICD-10-CM | POA: Insufficient documentation

## 2012-12-27 DIAGNOSIS — K921 Melena: Secondary | ICD-10-CM | POA: Insufficient documentation

## 2012-12-27 DIAGNOSIS — I10 Essential (primary) hypertension: Secondary | ICD-10-CM | POA: Insufficient documentation

## 2012-12-27 HISTORY — PX: COLONOSCOPY: SHX5424

## 2012-12-27 LAB — GLUCOSE, CAPILLARY
Glucose-Capillary: 157 mg/dL — ABNORMAL HIGH (ref 70–99)
Glucose-Capillary: 172 mg/dL — ABNORMAL HIGH (ref 70–99)

## 2012-12-27 SURGERY — COLONOSCOPY
Anesthesia: Moderate Sedation

## 2012-12-27 MED ORDER — MIDAZOLAM HCL 5 MG/5ML IJ SOLN
INTRAMUSCULAR | Status: DC | PRN
  Administered 2012-12-27 (×5): 2 mg via INTRAVENOUS

## 2012-12-27 MED ORDER — MIDAZOLAM HCL 5 MG/5ML IJ SOLN
INTRAMUSCULAR | Status: AC
  Filled 2012-12-27: qty 10

## 2012-12-27 MED ORDER — MEPERIDINE HCL 50 MG/ML IJ SOLN
INTRAMUSCULAR | Status: AC
  Filled 2012-12-27: qty 1

## 2012-12-27 MED ORDER — SODIUM CHLORIDE 0.9 % IV SOLN
Freq: Once | INTRAVENOUS | Status: AC
  Administered 2012-12-27: 14:00:00 via INTRAVENOUS

## 2012-12-27 MED ORDER — POLYETHYLENE GLYCOL 3350 17 G PO PACK: 17.0000 g | PACK | Freq: Every day | ORAL | Status: DC

## 2012-12-27 MED ORDER — MAGNESIUM CITRATE PO SOLN
1.0000 | Freq: Once | ORAL | Status: AC
  Administered 2012-12-27: 1 via ORAL
  Filled 2012-12-27: qty 296

## 2012-12-27 MED ORDER — STERILE WATER FOR IRRIGATION IR SOLN
Status: DC | PRN
  Administered 2012-12-27: 16:00:00

## 2012-12-27 MED ORDER — MEPERIDINE HCL 50 MG/ML IJ SOLN
INTRAMUSCULAR | Status: DC | PRN
  Administered 2012-12-27 (×2): 25 mg via INTRAVENOUS

## 2012-12-27 NOTE — Op Note (Signed)
COLONOSCOPY PROCEDURE REPORT  PATIENT:  Carlos Wolf  MR#:  [id number] Birthdate:  [birth year], 72 y.o., male Endoscopist:  Dr. Malissa Hippo, MD Referred By:  Dr. Bonnetta Barry ref. provider found Procedure Date: 12/27/2012  Procedure:   Colonoscopy which was incomplete.  Indications: Patient is 72 year old Caucasian male who underwent colonoscopy 2 weeks ago because of history of GI bleed. 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 His prep was very poor and examination therefore could not be completed. He now returns for repeat colonoscopy. His prep was deemed to be unsatisfactory and he was given 10 ounces of mag citrate in day hospital.  Informed Consent:  The procedure and risks were reviewed with the patient and informed consent was obtained.  Medications:  Demerol 50 mg IV Versed 10 mg IV  Description of procedure:  After a digital rectal exam was performed, that colonoscope was advanced from the anus through the rectum and colon. Scope was advanced into the transverse colon with difficulty as prep in the sigmoid and descending colon was poor and there were multiple large diverticula.  Findings:  Poor prep. Incomplete exam to mid transverse colon. Multiple diverticula at sigmoid colon. Flat ulcerated lesion noted at proximal rectum with central ulceration. This lesion was also palpated on digital exam.    Therapeutic/Diagnostic Maneuvers Performed:  None  Complications:  None  Cecal Withdrawal Time:  NA  Impression:  Incomplete exam to the transverse colon. 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 Multiple diverticula at sigmoid colon.   Recommendations:  Standard instructions given. Will schedule patient for chest and abdominopelvic CT and arrange for him to have rectal EUS. He would also need to have his proximal colon examined at some point.  Tigerlily Christine U  12/27/2012 4:53 PM  CC: Dr. Dwana Melena, MD & Dr. Bonnetta Barry ref.  provider found

## 2012-12-27 NOTE — H&P (Signed)
Carlos Wolf is an 71 y.o. male.   Chief Complaint:  Patient is here for colonoscopy. HPI: Patient is 71-year-old Caucasian male who was recently evaluated for GI bleed and underwent EGD followed by colonoscopy. His colonoscopy was incomplete secondary to poor prep. He did have flat ulcerated lesion and rectum which turned out to be adenocarcinoma. He's on returning so that colonoscopy could be completed to make sure he does not have any more lesions. He does give history of intermittent hematochezia for 3-4 years. Family history is negative for colorectal carcinoma.  Past Medical History  Diagnosis Date  . Hypertension   . High cholesterol   . Diabetes mellitus   . Acid reflux   . Skin cancer     to nose    Past Surgical History  Procedure Laterality Date  . Cardiac surgery    . Tonsillectomy    . Skin cancer excision      nose  . 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 Circumcision  10/24/2011    Procedure: CIRCUMCISION ADULT;  Surgeon: Mohammad I Javaid;  Location: [facility];  Service: Urology;  Laterality: N/A;  with repair of glans penis  . Cystoscopy  10/24/2011    Procedure: CYSTOSCOPY FLEXIBLE;  Surgeon: Mohammad I Javaid;  Location: [facility];  Service: Urology;;  . Esophagogastroduodenoscopy N/A 12/13/2012    Procedure: ESOPHAGOGASTRODUODENOSCOPY (EGD);  Surgeon: [name] U Rehman, MD;  Location: [facility];  Service: Endoscopy;  Laterality: N/A;  . Flexible sigmoidoscopy N/A 12/13/2012    Procedure: FLEXIBLE SIGMOIDOSCOPY;  Surgeon: [name] U Rehman, MD;  Location: [facility];  Service: Endoscopy;  Laterality: N/A;    Family History  Problem Relation Age of Onset  . Stroke Mother   . Heart failure Father   . Heart failure Brother   . Anesthesia problems Neg Hx   . Hypotension Neg Hx   . Malignant hyperthermia Neg Hx   . Pseudochol deficiency Neg Hx   . Colon cancer Neg Hx   . Colon polyps Neg Hx    Social History:  reports that he has never smoked.  He has never used smokeless tobacco. He reports that he does not drink alcohol or use illicit drugs.  Allergies:  Allergies  Allergen Reactions  . Docusate Sodium Hives  . Neosporin (Neomycin-Bacitracin Zn-Polymyx) Itching  . Percocet (Oxycodone-Acetaminophen) Nausea And Vomiting    Medications Prior to Admission  Medication Sig Dispense Refill  . insulin detemir (LEVEMIR) 100 UNIT/ML injection Inject 46 Units into the skin 2 (two) times daily.       . metFORMIN (GLUCOPHAGE) 500 MG tablet Take 500 mg by mouth 2 (two) times daily.       . omeprazole (PRILOSEC) 20 MG capsule Take 20 mg by mouth at bedtime.       . pioglitazone (ACTOS) 30 MG tablet Take 30 mg by mouth at bedtime.      . simvastatin (ZOCOR) 40 MG tablet Take 40 mg by mouth at bedtime.         Results for orders placed during the hospital encounter of 12/27/12 (from the past 48 hour(s))  GLUCOSE, CAPILLARY     Status: Abnormal   Collection Time    12/27/12  1:19 PM      Result Value Range   Glucose-Capillary 157 (*) 70 - 99 mg/dL  GLUCOSE, CAPILLARY     Status: Abnormal   Collection Time    12/27/12  3:22 PM        Result Value Range   Glucose-Capillary 172 (*) 70 - 99 mg/dL   No results found.  ROS  Blood pressure 175/73, pulse 84, temperature 97.8 F (36.6 C), temperature source Oral, resp. rate 21, height 5' 10" (1.778 m), weight 191 lb (86.637 kg), SpO2 99.00%. Physical Exam  Constitutional: He appears well-developed and well-nourished.  HENT:  Mouth/Throat: Oropharynx is clear and moist.  Vertical scar at forehead.  Eyes: Conjunctivae are normal. No scleral icterus.  Neck: No thyromegaly present.  Cardiovascular: Normal rate, regular rhythm and normal heart sounds.   No murmur heard. Respiratory: Effort normal and breath sounds normal.  GI: Soft. He exhibits no distension and no mass. There is no tenderness.  Musculoskeletal: Edema: trace edema around ankles.  Lymphadenopathy:    He has no cervical  adenopathy.  Neurological: He is alert.  Skin: Skin is warm and dry.     Assessment/Plan Rectal carcinoma. Colonoscopy 2 weeks ago could not be completed due to  poor prep.  Colonoscopy to complete evaluation of lower GI tract.  REHMAN,[name] U 12/27/2012, 3:44 PM    

## 2012-12-30 ENCOUNTER — Encounter (HOSPITAL_COMMUNITY): Payer: Self-pay | Admitting: Internal Medicine

## 2012-12-30 ENCOUNTER — Other Ambulatory Visit (INDEPENDENT_AMBULATORY_CARE_PROVIDER_SITE_OTHER): Payer: Self-pay | Admitting: Internal Medicine

## 2012-12-30 ENCOUNTER — Telehealth (INDEPENDENT_AMBULATORY_CARE_PROVIDER_SITE_OTHER): Payer: Self-pay | Admitting: *Deleted

## 2012-12-30 DIAGNOSIS — Z0189 Encounter for other specified special examinations: Secondary | ICD-10-CM

## 2012-12-30 DIAGNOSIS — C20 Malignant neoplasm of rectum: Secondary | ICD-10-CM

## 2012-12-30 NOTE — Telephone Encounter (Signed)
Lab noted and has been faxed to [facility]

## 2012-12-30 NOTE — Telephone Encounter (Signed)
Patient to have Ct , lab need prior to test.

## 2012-12-30 NOTE — Telephone Encounter (Signed)
Patient sch'd for CT chest/abd/pelvis 01/02/13 and will need creatinine

## 2013-01-02 ENCOUNTER — Other Ambulatory Visit (HOSPITAL_COMMUNITY): Payer: MEDICARE

## 2013-01-02 ENCOUNTER — Ambulatory Visit (HOSPITAL_COMMUNITY)
Admission: RE | Admit: 2013-01-02 | Discharge: 2013-01-02 | Disposition: A | Discharge status: Home / Self Care | Payer: MEDICARE | Source: Ambulatory Visit | Attending: Internal Medicine | Admitting: Internal Medicine

## 2013-01-02 ENCOUNTER — Telehealth: Payer: Self-pay | Admitting: Gastroenterology

## 2013-01-02 DIAGNOSIS — K5730 Diverticulosis of large intestine without perforation or abscess without bleeding: Secondary | ICD-10-CM | POA: Insufficient documentation

## 2013-01-02 DIAGNOSIS — R599 Enlarged lymph nodes, unspecified: Secondary | ICD-10-CM | POA: Insufficient documentation

## 2013-01-02 DIAGNOSIS — C20 Malignant neoplasm of rectum: Secondary | ICD-10-CM | POA: Insufficient documentation

## 2013-01-02 MED ORDER — IOHEXOL 300 MG/ML  SOLN
100.0000 mL | Freq: Once | INTRAMUSCULAR | Status: AC | PRN
  Administered 2013-01-02: 100 mL via INTRAVENOUS

## 2013-01-03 ENCOUNTER — Other Ambulatory Visit: Payer: Self-pay | Admitting: *Deleted

## 2013-01-03 DIAGNOSIS — Z85048 Personal history of other malignant neoplasm of rectum, rectosigmoid junction, and anus: Secondary | ICD-10-CM

## 2013-01-03 NOTE — Telephone Encounter (Signed)
Carlos Wolf, He needs lower EUS, next week (lunch case if needed).  Moderate sedation is fine.  Should prep as full colonoscopy and actually he will need 2 day prep protocol.  I will plan to do full colonoscopy and also rectal EUS.  Dx:  Rectal cancer staging, 60 min.  [name], I noted that he has had poor prep on both of your colonoscopy attempts.  I will try to get a complete colonoscopy again after 2 day, extended prep and then also to perform rectal EUS.    Thanks

## 2013-01-03 NOTE — Telephone Encounter (Signed)
Pt is scheduled for Lower EUS on 01/10/13 at 11:30am, moderate sedation, Radial only, 60 minutes followed by a full COLONOSCOPY. Scheduled with Camelia Eng, booking # [phone]. Dr [facility] Sermons [facility] is closed this afternoon, but I spoke with pt's wife. Informed her of the appt, but someone will call her about his prep. Wife was unaware that a COLON would be done, but I explained pt had a poor prep before and Dr Karilyn Cota could not complete the exam. Informed her I did not know if Dr Queen Slough of[facility] could give instructions, but someone will call her Monday, 01/06/13; she stated understanding.

## 2013-01-03 NOTE — Telephone Encounter (Signed)
I would agree plans to do colonoscopy at the time of rectal EUS.

## 2013-01-06 ENCOUNTER — Encounter: Payer: Self-pay | Admitting: *Deleted

## 2013-01-06 MED ORDER — PEG-KCL-NACL-NASULF-NA ASC-C 100 G PO SOLR: 1.0000 | Freq: Once | ORAL | Status: DC

## 2013-01-06 NOTE — Telephone Encounter (Signed)
Phoned pt to ask if he can make a 01/16/13 appt and he stated he could informed him I will mail him instructions. Informed Dr Christella Hartigan.  Booking # [phone] with Wille Celeste

## 2013-01-06 NOTE — Telephone Encounter (Signed)
Mailed instructions with a letter and a voucher for [employer] Prep to pt.

## 2013-01-06 NOTE — Telephone Encounter (Signed)
Anne from Dr [facility] called to ask if I had faxed the prep for pt and I informed her I have been trying to get in touch with the pt to see where he prefers to go. Will call her when I know something. Left a message on wife's cell.

## 2013-01-06 NOTE — Telephone Encounter (Signed)
lmom for pt to call back. I have the prep instructions for his Lower EUS and COLON. I can fax it to the pt or he can go to Dr Pat[facility] Sermons [facility] to pick up the instructions and a voucher for the [employer] prep.

## 2013-01-09 ENCOUNTER — Encounter (HOSPITAL_COMMUNITY): Payer: Self-pay | Admitting: *Deleted

## 2013-01-09 ENCOUNTER — Encounter (HOSPITAL_COMMUNITY): Payer: Self-pay | Admitting: Pharmacy Technician

## 2013-01-09 NOTE — Pre-Procedure Instructions (Signed)
Your procedure is scheduled [id number], January 16, 2013 Report to [facility] Admitting [id number] Call this number if you have problems morning of your procedure:[phone]  Follow all bowel prep instructions per your doctor's orders.  Do not eat or drink anything after midnight the night before your procedure. You may brush your teeth, rinse out your mouth, but no water, no food, no chewing gum, no mints, no candies, no chewing tobacco.     Take these medicines the morning of your procedure with A SIP OF WATER:Metoprolol Asked to take 1/2 of Wednesday evening Levemir insulin per anesthesia orderd, No diabetic medication the morning of the procedure.  Please make arrangements for a responsible person to drive you home after the procedure. You cannot go home by cab/taxi. We recommend you have someone with you at home the first 24 hours after your procedure. Driver for procedure is wife Carlos Wolf [phone]  LEAVE ALL VALUABLES, JEWELRY, BILLFOLD AT HOME.  NO DENTURES, CONTACT LENSES ALLOWED IN THE ENDOSCOPY ROOM.   YOU MAY WEAR DEODORANT, PLEASE REMOVE ALL JEWELRY, WATCHES RINGS, BODY PIERCINGS AND LEAVE AT HOME.   WOMEN: NO MAKE-UP, LOTIONS PERFUMES

## 2013-01-10 ENCOUNTER — Encounter (HOSPITAL_COMMUNITY)
Admission: RE | Disposition: A | Discharge status: Home / Self Care | Payer: Self-pay | Source: Ambulatory Visit | Attending: Gastroenterology

## 2013-01-10 SURGERY — ESOPHAGEAL ENDOSCOPIC ULTRASOUND (EUS) RADIAL
Anesthesia: Moderate Sedation

## 2013-01-16 ENCOUNTER — Encounter (HOSPITAL_COMMUNITY): Payer: Self-pay | Admitting: Anesthesiology

## 2013-01-16 ENCOUNTER — Telehealth: Payer: Self-pay | Admitting: Gastroenterology

## 2013-01-16 ENCOUNTER — Encounter (HOSPITAL_COMMUNITY): Payer: Self-pay

## 2013-01-16 ENCOUNTER — Ambulatory Visit (HOSPITAL_COMMUNITY): Payer: Medicare (Managed Care) | Admitting: Anesthesiology

## 2013-01-16 ENCOUNTER — Encounter (HOSPITAL_COMMUNITY)
Admission: RE | Disposition: A | Discharge status: Home / Self Care | Payer: Self-pay | Source: Ambulatory Visit | Attending: Gastroenterology

## 2013-01-16 ENCOUNTER — Ambulatory Visit (HOSPITAL_COMMUNITY)
Admission: RE | Admit: 2013-01-16 | Discharge: 2013-01-16 | Disposition: A | Discharge status: Home / Self Care | Payer: Medicare (Managed Care) | Source: Ambulatory Visit | Attending: Gastroenterology | Admitting: Gastroenterology

## 2013-01-16 DIAGNOSIS — Z951 Presence of aortocoronary bypass graft: Secondary | ICD-10-CM | POA: Insufficient documentation

## 2013-01-16 DIAGNOSIS — I10 Essential (primary) hypertension: Secondary | ICD-10-CM | POA: Insufficient documentation

## 2013-01-16 DIAGNOSIS — Z85048 Personal history of other malignant neoplasm of rectum, rectosigmoid junction, and anus: Secondary | ICD-10-CM

## 2013-01-16 DIAGNOSIS — D128 Benign neoplasm of rectum: Secondary | ICD-10-CM | POA: Insufficient documentation

## 2013-01-16 DIAGNOSIS — Z79899 Other long term (current) drug therapy: Secondary | ICD-10-CM | POA: Insufficient documentation

## 2013-01-16 DIAGNOSIS — R609 Edema, unspecified: Secondary | ICD-10-CM | POA: Insufficient documentation

## 2013-01-16 DIAGNOSIS — K625 Hemorrhage of anus and rectum: Secondary | ICD-10-CM

## 2013-01-16 DIAGNOSIS — Z794 Long term (current) use of insulin: Secondary | ICD-10-CM | POA: Insufficient documentation

## 2013-01-16 DIAGNOSIS — E7800 Pure hypercholesterolemia, unspecified: Secondary | ICD-10-CM | POA: Insufficient documentation

## 2013-01-16 DIAGNOSIS — K219 Gastro-esophageal reflux disease without esophagitis: Secondary | ICD-10-CM | POA: Insufficient documentation

## 2013-01-16 DIAGNOSIS — C20 Malignant neoplasm of rectum: Secondary | ICD-10-CM

## 2013-01-16 DIAGNOSIS — D126 Benign neoplasm of colon, unspecified: Secondary | ICD-10-CM

## 2013-01-16 DIAGNOSIS — E119 Type 2 diabetes mellitus without complications: Secondary | ICD-10-CM | POA: Insufficient documentation

## 2013-01-16 HISTORY — PX: EUS: SHX5427

## 2013-01-16 HISTORY — PX: COLONOSCOPY: SHX5424

## 2013-01-16 HISTORY — DX: Other fecal abnormalities: R19.5

## 2013-01-16 SURGERY — ULTRASOUND, LOWER GI TRACT, ENDOSCOPIC
Anesthesia: Monitor Anesthesia Care

## 2013-01-16 MED ORDER — PROPOFOL INFUSION 10 MG/ML OPTIME
INTRAVENOUS | Status: DC | PRN
  Administered 2013-01-16: 140 ug/kg/min via INTRAVENOUS

## 2013-01-16 MED ORDER — LACTATED RINGERS IV SOLN
INTRAVENOUS | Status: DC | PRN
  Administered 2013-01-16: 09:00:00 via INTRAVENOUS

## 2013-01-16 MED ORDER — SODIUM CHLORIDE 0.9 % IV SOLN: INTRAVENOUS | Status: DC

## 2013-01-16 MED ORDER — SPOT INK MARKER SYRINGE KIT
PACK | SUBMUCOSAL | Status: DC | PRN
  Administered 2013-01-16: 1.5 mL via SUBMUCOSAL

## 2013-01-16 MED ORDER — PROPOFOL 10 MG/ML IV BOLUS
INTRAVENOUS | Status: DC | PRN
  Administered 2013-01-16: 40 mg via INTRAVENOUS

## 2013-01-16 MED ORDER — SPOT INK MARKER SYRINGE KIT
PACK | SUBMUCOSAL | Status: AC
  Filled 2013-01-16: qty 5

## 2013-01-16 NOTE — Transfer of Care (Signed)
Immediate Anesthesia Transfer of Care Note  Patient: Carlos Wolf  Procedure(s) Performed: Procedure(s): LOWER ENDOSCOPIC ULTRASOUND (EUS) radial only, 60 minutes, staging of rectal cancer, colonoscopy to follow (N/A) COLONOSCOPY (N/A)  Patient Location: PACU and Endoscopy Unit  Anesthesia Type:MAC  Level of Consciousness: awake, sedated and patient cooperative  Airway & Oxygen Therapy: Patient Spontanous Breathing and Patient connected to face mask oxygen  Post-op Assessment: Report given to PACU RN and Post -op Vital signs reviewed and stable  Post vital signs: Reviewed and stable  Complications: No apparent anesthesia complications

## 2013-01-16 NOTE — Anesthesia Postprocedure Evaluation (Signed)
Anesthesia Post Note  Patient: Carlos Wolf  Procedure(s) Performed: Procedure(s) (LRB): LOWER ENDOSCOPIC ULTRASOUND (EUS) radial only, 60 minutes, staging of rectal cancer, colonoscopy to follow (N/A) COLONOSCOPY (N/A)  Anesthesia type: MAC  Patient location: PACU  Post pain: Pain level controlled  Post assessment: Post-op Vital signs reviewed  Last Vitals:  Filed Vitals:   01/16/13 1026  BP: 94/43  Temp: 36.4 C  Resp: 17    Post vital signs: Reviewed  Level of consciousness: sedated  Complications: No apparent anesthesia complications

## 2013-01-16 NOTE — Op Note (Addendum)
[facility] [address] [place], [address]   COLONOSCOPY/EUS PROCEDURE REPORT PATIENT: Carlos Wolf, Carlos Wolf  MR#: [id number] BIRTHDATE: 08/29/1941 , 71  yrs. old GENDER: Male ENDOSCOPIST: Rachael Fee, MD REFERRED XB:JYNWGN Rehman, M.D. PROCEDURE DATE:  01/16/2013 PROCEDURE:   Colonoscopy with snare polypectomy, Submucosal injection, any substance, and Colonoscopy with EUS ASA CLASS:   Class III INDICATIONS:newly diagnosed rectal adenocarcinoma (Dr.  Karilyn Cota), no clear distant disease on CT scan; incomplete colon examination twice due to poor prep. MEDICATIONS: MAC sedation, administered by CRNA DESCRIPTION OF PROCEDURE:   After the risks benefits and alternatives of the procedure were thoroughly explained, informed consent was obtained.  A digital rectal exam revealed firm mass in anterior, right lateral rectum.   The Pentax Colonoscope [id number] endoscope was introduced through the anus and advanced to the cecum, which was identified by both the appendix and ileocecal valve. No adverse events experienced.   The quality of the prep was fair.  The instrument was then slowly withdrawn as the colon was fully examined.  Colonoscopic findings: Three polyps were found, removed and sent to pathology.  These were all sessile, all removed with cold snare, ranging in size from 4mm to 8mm, located in transverse, descending and rectum.  The previously described and biopsied rectal adenocarcinoma was clearly noted.  It was non-circumferential, 3cm across, ulcerated centrally, friable, the distal edge lies 7cm from anal verge.  The lateral sides of the lesion were labeled within [place] Ink submucosal injection.  Retroflexed views revealed no abnormalities. The time to cecum=10 minutes 00 seconds.  Withdrawal time=20 minutes 00 seconds. Radial EUS findings: 1. The lesion described above was hypoechoic, heterogeneous, clearly passed into and through the muscularis propria  layer of rectal wall (uT3) 2. There were no suspicious perirectal lymphnodes (uN0). COMPLICATIONS: There were no complications. ENDOSCOPIC IMPRESSION: 3cm diameter, uT3N0 non-cicumferential rectal adenocarcinoma with distal edge 7cm from anal verge. This was labeled with [place] Ink injection Three other polyps were found, removed on this examination to the cecum.  RECOMMENDATIONS: He will need referrals to medical, radiation oncology as well as general surgeon.  I will communicate these findings with Dr. Karilyn Cota eSigned:  Rachael Fee, MD 01/16/2013 10:41 AM Revised: 01/16/2013 10:41 AM

## 2013-01-16 NOTE — H&P (View-Only) (Signed)
Carlos Wolf is an 72 y.o. male.   Chief Complaint:  Patient is here for colonoscopy. HPI: Patient is 72 year old Caucasian male who was recently evaluated for GI bleed and underwent EGD followed by colonoscopy. His colonoscopy was incomplete secondary to poor prep. He did have flat ulcerated lesion and rectum which turned out to be adenocarcinoma. He's on returning so that colonoscopy could be completed to make sure he does not have any more lesions. He does give history of intermittent hematochezia for 3-4 years. Family history is negative for colorectal carcinoma.  Past Medical History  Diagnosis Date  . Hypertension   . High cholesterol   . Diabetes mellitus   . Acid reflux   . Skin cancer     to nose    Past Surgical History  Procedure Laterality Date  . Cardiac surgery    . Tonsillectomy    . Skin cancer excision      nose  . 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 Circumcision  10/24/2011    Procedure: CIRCUMCISION ADULT;  Surgeon: Ky Barban;  Location: [facility];  Service: Urology;  Laterality: N/A;  with repair of glans penis  . Cystoscopy  10/24/2011    Procedure: CYSTOSCOPY FLEXIBLE;  Surgeon: Ky Barban;  Location: [facility];  Service: Urology;;  . Esophagogastroduodenoscopy N/A 12/13/2012    Procedure: ESOPHAGOGASTRODUODENOSCOPY (EGD);  Surgeon: Malissa Hippo, MD;  [facility];  Service: Endoscopy;  Laterality: N/A;  . Flexible sigmoidoscopy N/A 12/13/2012    Procedure: FLEXIBLE SIGMOIDOSCOPY;  Surgeon: Malissa Hippo, MD;  [facility]: [facility];  Service: Endoscopy;  Laterality: N/A;    Family History  Problem Relation Age of Onset  . Stroke Mother   . Heart failure Father   . Heart failure Brother   . Anesthesia problems Neg Hx   . Hypotension Neg Hx   . Malignant hyperthermia Neg Hx   . Pseudochol deficiency Neg Hx   . Colon cancer Neg Hx   . Colon polyps Neg Hx    Social History:  reports that he has never smoked.  He has never used smokeless tobacco. He reports that he does not drink alcohol or use illicit drugs.  Allergies:  Allergies  Allergen Reactions  . Docusate Sodium Hives  . Neosporin (Neomycin-Bacitracin Zn-Polymyx) Itching  . Percocet (Oxycodone-Acetaminophen) Nausea And Vomiting    Medications Prior to Admission  Medication Sig Dispense Refill  . insulin detemir (LEVEMIR) 100 UNIT/ML injection Inject 46 Units into the skin 2 (two) times daily.       . metFORMIN (GLUCOPHAGE) 500 MG tablet Take 500 mg by mouth 2 (two) times daily.       Marland Kitchen omeprazole (PRILOSEC) 20 MG capsule Take 20 mg by mouth at bedtime.       . pioglitazone (ACTOS) 30 MG tablet Take 30 mg by mouth at bedtime.      . simvastatin (ZOCOR) 40 MG tablet Take 40 mg by mouth at bedtime.         Results for orders placed during the hospital encounter of 12/27/12 (from the past 48 hour(s))  GLUCOSE, CAPILLARY     Status: Abnormal   Collection Time    12/27/12  1:19 PM      Result Value Range   Glucose-Capillary 157 (*) 70 - 99 mg/dL  GLUCOSE, CAPILLARY     Status: Abnormal   Collection Time    12/27/12  3:22 PM  Result Value Range   Glucose-Capillary 172 (*) 70 - 99 mg/dL   No results found.  ROS  Blood pressure 175/73, pulse 84, temperature 97.8 F (36.6 C), temperature source Oral, resp. rate 21, height 5\' 10"  (1.778 m), weight 191 lb (86.637 kg), SpO2 99.00%. Physical Exam  Constitutional: He appears well-developed and well-nourished.  HENT:  Mouth/Throat: Oropharynx is clear and moist.  Vertical scar at forehead.  Eyes: Conjunctivae are normal. No scleral icterus.  Neck: No thyromegaly present.  Cardiovascular: Normal rate, regular rhythm and normal heart sounds.   No murmur heard. Respiratory: Effort normal and breath sounds normal.  GI: Soft. He exhibits no distension and no mass. There is no tenderness.  Musculoskeletal: Edema: trace edema around ankles.  Lymphadenopathy:    He has no cervical  adenopathy.  Neurological: He is alert.  Skin: Skin is warm and dry.     Assessment/Plan Rectal carcinoma. Colonoscopy 2 weeks ago could not be completed due to  poor prep.  Colonoscopy to complete evaluation of lower GI tract.  REHMAN,[name] U 12/27/2012, 3:44 PM

## 2013-01-16 NOTE — Anesthesia Preprocedure Evaluation (Signed)
Anesthesia Evaluation  Patient identified by MRN, date of birth, ID band Patient awake    Reviewed: Allergy & Precautions, H&P , NPO status , Patient's Chart, lab work & pertinent test results, reviewed documented beta blocker date and time   History of Anesthesia Complications Negative for: history of anesthetic complications  Airway Mallampati: II TM Distance: >3 FB Neck ROM: Full    Dental  (+) Teeth Intact, Poor Dentition, Dental Advisory Given, Loose and Chipped   Pulmonary neg pulmonary ROS,  breath sounds clear to auscultation        Cardiovascular hypertension, - angina+ CAD and + CABG Rhythm:Regular Rate:Normal     Neuro/Psych negative neurological ROS  negative psych ROS   GI/Hepatic Neg liver ROS, GERD-  Medicated and Controlled,  Endo/Other  negative endocrine ROSdiabetes, Well Controlled, Type 2, Insulin Dependent  Renal/GU negative Renal ROS  negative genitourinary   Musculoskeletal negative musculoskeletal ROS (+)   Abdominal   Peds  Hematology negative hematology ROS (+)   Anesthesia Other Findings   Reproductive/Obstetrics negative OB ROS                           Anesthesia Physical Anesthesia Plan  ASA: III  Anesthesia Plan: MAC   Post-op Pain Management:    Induction: Intravenous  Airway Management Planned: Simple Face Mask  Additional Equipment:   Intra-op Plan:   Post-operative Plan:   Informed Consent: I have reviewed the patients History and Physical, chart, labs and discussed the procedure including the risks, benefits and alternatives for the proposed anesthesia with the patient or authorized representative who has indicated his/her understanding and acceptance.   Dental advisory given  Plan Discussed with: CRNA  Anesthesia Plan Comments:         Anesthesia Quick Evaluation

## 2013-01-16 NOTE — Telephone Encounter (Signed)
Pt wife notified that the message was from several days ago and has already been taken care of

## 2013-01-16 NOTE — Interval H&P Note (Signed)
History and Physical Interval Note:  01/16/2013 8:44 AM  Sheran Spine  has presented today for surgery, with the diagnosis of History of rectal cancer [V10.06] Abnormal abdominal CT scan [793.6] Rectal bleeding [569.3]  The various methods of treatment have been discussed with the patient and family. After consideration of risks, benefits and other options for treatment, the patient has consented to  Procedure(s): LOWER ENDOSCOPIC ULTRASOUND (EUS) radial only, 60 minutes, staging of rectal cancer, colonoscopy to follow (N/A) COLONOSCOPY (N/A) as a surgical intervention .  The patient's history has been reviewed, patient examined, no change in status, stable for surgery.  I have reviewed the patient's chart and labs.  Questions were answered to the patient's satisfaction.     Rob Bunting

## 2013-01-16 NOTE — Preoperative (Signed)
Beta Blockers   Reason not to administer Beta Blockers:Not Applicable 

## 2013-01-17 ENCOUNTER — Encounter (HOSPITAL_COMMUNITY): Payer: Self-pay | Admitting: Gastroenterology

## 2013-01-24 ENCOUNTER — Encounter (HOSPITAL_COMMUNITY): Payer: MEDICARE | Attending: Oncology | Admitting: Oncology

## 2013-01-24 ENCOUNTER — Encounter (HOSPITAL_COMMUNITY): Payer: Self-pay | Admitting: Oncology

## 2013-01-24 VITALS — BP 182/67 | HR 60 | Temp 97.5°F | Resp 20 | Ht 68.5 in | Wt 189.1 lb

## 2013-01-24 DIAGNOSIS — R599 Enlarged lymph nodes, unspecified: Secondary | ICD-10-CM

## 2013-01-24 DIAGNOSIS — E7800 Pure hypercholesterolemia, unspecified: Secondary | ICD-10-CM | POA: Insufficient documentation

## 2013-01-24 DIAGNOSIS — E119 Type 2 diabetes mellitus without complications: Secondary | ICD-10-CM

## 2013-01-24 DIAGNOSIS — I2510 Atherosclerotic heart disease of native coronary artery without angina pectoris: Secondary | ICD-10-CM | POA: Insufficient documentation

## 2013-01-24 DIAGNOSIS — C20 Malignant neoplasm of rectum: Secondary | ICD-10-CM

## 2013-01-24 LAB — CBC WITH DIFFERENTIAL/PLATELET
Basophils Absolute: 0 10*3/uL (ref 0.0–0.1)
Eosinophils Absolute: 0.2 10*3/uL (ref 0.0–0.7)
Eosinophils Relative: 3 % (ref 0–5)
HCT: 35 % — ABNORMAL LOW (ref 39.0–52.0)
Lymphocytes Relative: 21 % (ref 12–46)
MCH: 28.5 pg (ref 26.0–34.0)
MCHC: 34.3 g/dL (ref 30.0–36.0)
MCV: 83.1 fL (ref 78.0–100.0)
Monocytes Absolute: 0.6 10*3/uL (ref 0.1–1.0)
Platelets: 183 10*3/uL (ref 150–400)
RDW: 13.9 % (ref 11.5–15.5)
WBC: 6.4 10*3/uL (ref 4.0–10.5)

## 2013-01-24 LAB — COMPREHENSIVE METABOLIC PANEL
AST: 11 U/L (ref 0–37)
CO2: 27 mEq/L (ref 19–32)
Calcium: 9.6 mg/dL (ref 8.4–10.5)
Creatinine, Ser: 1.21 mg/dL (ref 0.50–1.35)
GFR calc Af Amer: 67 mL/min — ABNORMAL LOW (ref >90)
GFR calc non Af Amer: 58 mL/min — ABNORMAL LOW (ref >90)
Sodium: 137 mEq/L (ref 135–145)
Total Protein: 6.9 g/dL (ref 6.0–8.3)

## 2013-01-24 NOTE — Progress Notes (Signed)
Diagnosis #1 T3 N0 rectal cancer, 7 cm above the anal verge, positive biopsy but no obvious positive nodes by ultrasound. This gives him as far stage II A. disease only of the rectum. CAT scan however reveals several mildly prominent lymph nodes in the perirectal area and pelvis that are slightly suspicious for metastatic disease  #2 diabetes mellitus x20+ years #3 mild excessive weight #4 right cataract operations with lens implant #5 coronary artery disease status post open heart surgery #6 hypercholesterolemia #7 skin cancer status post Mohs surgery at [facility] several years ago #8 GERD  Very pleasant gentleman who resisted a colonoscopy for many years. 2-3 years ago he noticed a little bit of intermittent blood in his stool that he attributed to hemorrhoids. In January however he developed significant blood in his stool on a regular basis which prompted him to discuss this with his primary care physician, who sent him to Dr. Karilyn Cota. He also saw Dr. Wendall Papa [facility] for EUS/colonoscopy as well. No other lesions were found.  He is referred here for consultation. He has no family history of colorectal cancer realistically. He has one brother with heart disease. His mother died of a stroke in his father died of a heart attack. He was 5 years old and his mother was 67 years old. She had multiple small strokes before a major 1 leading to her nursing home placement. He is not a smoker not a drinker. He worked for [employer] ([employer]) for 30+ years. Appetite is excellent. He has no weight loss. No fevers, no chills, no trouble swallowing presently. He does not follow a diabetic diet whatsoever. His sugars in the 140-180 range.  BP 182/67  Pulse 60  Temp(Src) 97.5 F (36.4 C) (Oral)  Resp 20  Ht 5' 8.5" (1.74 m)  Wt 189 lb 1.6 oz (85.775 kg)  BMI 28.33 kg/m2  SpO2 100%  He is in no acute distress. He has no lymphadenopathy palpable in any location including cervical, supraclavicular,  infraclavicular, axillary, epitrochlear, or inguinal areas. He has no thyromegaly. Lungs are clear to auscultation and percussion. Skin exam shows a large scar in the midline above his nose extending down into the nose into the superior portion of the for head consistent with his prior Mohs surgery with no obvious evidence for recurrent skin cancer. Otherwise his normal facial symmetry. Teeth are in good repair. The right pupil shows changes of prior cataract operations. Abdomen is soft and nontender without hepatosplenomegaly. Heart shows a regular rhythm and rate without murmur rub or gallop. Chest shows a midline sternal incision that is well-healed. He has no edema of his legs but he is absence of pulses in his feet. He is right handed. Bowel sounds are diminished but present. Skin exam otherwise shows no new cancer lesions that I can appreciate.  This gentleman presents in that he has at least a T3 lesion, biopsy-proven adenocarcinoma of the rectum. His CAT scan is somewhat worrisome but his EUS was negative. I think it's very important to see if we can find out whether he has positive nodes by PET scan criteria. If he does this would be stage III disease not stage II disease. He would be I will give him up front chemotherapy and radiation. He would then benefit from surgery of course but if his nodes were negative surgically, then we still might be concerned about giving him postoperative FOLFOX or XELOX therapy to be on the safe side. Certainly preoperative chemotherapy/radiation therapy can sterilize many  lymph nodes, and it is reassuring to have a negative rectal endoscopy but I would not want to give this diabetic postoperative oxaliplatinum-based therapy unless she absolutely needs it.  I will discuss his case with Dr. Karilyn Cota, get a consultation with Dr. Thersa Salt in the meantime and schedule a PET scan as soon as possible. It certainly easier on the individual Preoperative radiation/chemotherapy and  postoperative however pre-radiation surgical therapy would absolutely identify whether his lymph nodes are positive or negative.  We of course want to cure him if at all possible. I do think he needs much more attention paid to his diabetes. He is somewhat delinquent in the following of a diabetic regimen of food intake. The family is agreeable to a diabetic dietitian consultation which we will schedule. We will see him back right after the PET scan.

## 2013-01-24 NOTE — Patient Instructions (Addendum)
[facility] Discharge Instructions  RECOMMENDATIONS MADE BY THE CONSULTANT AND ANY TEST RESULTS WILL BE SENT TO YOUR REFERRING PHYSICIAN.  EXAM FINDINGS BY THE PHYSICIAN TODAY AND SIGNS OR SYMPTOMS TO REPORT TO CLINIC OR PRIMARY PHYSICIAN: Exam and discusson by MD.  Will make a referral for diabetes education regarding your diet.  Will do consult with Radiation Oncology at [facility] with Dr. Thersa Salt.  Need to do a PET scan to see what all is going on.  Plans are to give you Capecitabine in conjunction with radiation.  Will decide if you need chemotherapy after we get the PET scan.  MEDICATIONS PRESCRIBED:  none  INSTRUCTIONS GIVEN AND DISCUSSED: PET scan will be done at [facility] on March 28 at 7:30 am.  You will need to arrive at 7:15am.  Don't take your diabetes medication until after the scan is completed- nothing to eat or drink 6 hours prior to the scan.  Do eat or drink anything with sugar in it.  No gum or hard candy. We will call you with your appointment with Dr. Thersa Salt.  SPECIAL INSTRUCTIONS/FOLLOW-UP After the PET scan to see PA.  Thank you for choosing [facility] to provide your oncology and hematology care.  To afford each patient quality time with our providers, please arrive at least 15 minutes before your scheduled appointment time.  With your help, our goal is to use those 15 minutes to complete the necessary work-up to ensure our physicians have the information they need to help with your evaluation and healthcare recommendations.    Effective January 1st, 2014, we ask that you re-schedule your appointment with our physicians should you arrive 10 or more minutes late for your appointment.  We strive to give you quality time with our providers, and arriving late affects you and other patients whose appointments are after yours.    Again, thank you for choosing [facility].  Our hope is that these  requests will decrease the amount of time that you wait before being seen by our physicians.       _____________________________________________________________  Should you have questions after your visit to [name], please contact our office at [phone] between the hours of 8:30 a.m. and 5:00 p.m.  Voicemails left after 4:30 p.m. will not be returned until the following business day.  For prescription refill requests, have your pharmacy contact our office with your prescription refill request.

## 2013-01-28 ENCOUNTER — Encounter (HOSPITAL_COMMUNITY): Payer: Self-pay | Admitting: Dietician

## 2013-01-28 ENCOUNTER — Telehealth (HOSPITAL_COMMUNITY): Payer: Self-pay | Admitting: Dietician

## 2013-01-28 NOTE — Progress Notes (Signed)
Entered in error. Please disregard

## 2013-01-28 NOTE — Telephone Encounter (Signed)
Received referral via [facility] from [facility] (Dr. Mariel Sleet) for dx: diabetes.

## 2013-01-31 ENCOUNTER — Ambulatory Visit (HOSPITAL_COMMUNITY)
Admission: RE | Admit: 2013-01-31 | Discharge: 2013-01-31 | Disposition: A | Discharge status: Home / Self Care | Payer: MEDICARE | Source: Ambulatory Visit | Attending: Oncology | Admitting: Oncology

## 2013-01-31 ENCOUNTER — Encounter (HOSPITAL_COMMUNITY): Payer: Self-pay

## 2013-01-31 DIAGNOSIS — M8569 Other cyst of bone, multiple sites: Secondary | ICD-10-CM | POA: Insufficient documentation

## 2013-01-31 DIAGNOSIS — K5730 Diverticulosis of large intestine without perforation or abscess without bleeding: Secondary | ICD-10-CM | POA: Insufficient documentation

## 2013-01-31 DIAGNOSIS — R599 Enlarged lymph nodes, unspecified: Secondary | ICD-10-CM | POA: Insufficient documentation

## 2013-01-31 DIAGNOSIS — C20 Malignant neoplasm of rectum: Secondary | ICD-10-CM | POA: Insufficient documentation

## 2013-01-31 LAB — GLUCOSE, CAPILLARY: Glucose-Capillary: 67 mg/dL — ABNORMAL LOW (ref 70–99)

## 2013-01-31 MED ORDER — FLUDEOXYGLUCOSE F - 18 (FDG) INJECTION
21.0000 | Freq: Once | INTRAVENOUS | Status: AC | PRN
  Administered 2013-01-31: 21 via INTRAVENOUS

## 2013-02-03 NOTE — Telephone Encounter (Signed)
Pt did not respond to previous contact attempt (voicemail was left on pt's home phone on 01/28/13 at 1043). Sent letter to pt home via [place] [employer] in attempt to contact pt to schedule appointment.

## 2013-02-04 ENCOUNTER — Encounter (HOSPITAL_COMMUNITY): Payer: MEDICARE | Attending: Oncology | Admitting: Oncology

## 2013-02-04 ENCOUNTER — Telehealth (HOSPITAL_COMMUNITY): Payer: Self-pay | Admitting: *Deleted

## 2013-02-04 ENCOUNTER — Encounter (HOSPITAL_COMMUNITY): Payer: Self-pay | Admitting: Oncology

## 2013-02-04 DIAGNOSIS — E7800 Pure hypercholesterolemia, unspecified: Secondary | ICD-10-CM | POA: Insufficient documentation

## 2013-02-04 DIAGNOSIS — R599 Enlarged lymph nodes, unspecified: Secondary | ICD-10-CM

## 2013-02-04 DIAGNOSIS — E119 Type 2 diabetes mellitus without complications: Secondary | ICD-10-CM

## 2013-02-04 DIAGNOSIS — I2510 Atherosclerotic heart disease of native coronary artery without angina pectoris: Secondary | ICD-10-CM | POA: Insufficient documentation

## 2013-02-04 DIAGNOSIS — C20 Malignant neoplasm of rectum: Secondary | ICD-10-CM

## 2013-02-04 HISTORY — DX: Type 2 diabetes mellitus without complications: E11.9

## 2013-02-04 NOTE — Patient Instructions (Addendum)
[facility] Discharge Instructions  RECOMMENDATIONS MADE BY THE CONSULTANT AND ANY TEST RESULTS WILL BE SENT TO YOUR REFERRING PHYSICIAN.  MEDICATIONS PRESCRIBED:  None  INSTRUCTIONS GIVEN AND DISCUSSED: Change in treatment plan.  We recommend surgery first and depending on results from surgery, plan will be developed.   SPECIAL INSTRUCTIONS/FOLLOW-UP: Go to consultation appointment with Dr. Thersa Salt at [facility] in [place] for radiation consultation. Will refer you to Dr. Malvin Johns for surgery. If you do not hear back from Dr. Daisy Blossom office by tomorrow at noon (02/05/13) please call the [facility] so we can follow-up with his office.  Return in 4 weeks for follow-up [facility] phone number is [phone]  Thank you for choosing [facility] to provide your oncology and hematology care.  To afford each patient quality time with our providers, please arrive at least 15 minutes before your scheduled appointment time.  With your help, our goal is to use those 15 minutes to complete the necessary work-up to ensure our physicians have the information they need to help with your evaluation and healthcare recommendations.    Effective January 1st, 2014, we ask that you re-schedule your appointment with our physicians should you arrive 10 or more minutes late for your appointment.  We strive to give you quality time with our providers, and arriving late affects you and other patients whose appointments are after yours.    Again, thank you for choosing [facility].  Our hope is that these requests will decrease the amount of time that you wait before being seen by our physicians.       _____________________________________________________________  Should you have questions after your visit to [facility], please contact our office at [phone] between the hours of 8:30 a.m. and 5:00 p.m.   Voicemails left after 4:30 p.m. will not be returned until the following business day.  For prescription refill requests, have your pharmacy contact our office with your prescription refill request.

## 2013-02-04 NOTE — Telephone Encounter (Signed)
He has appointment with Dr. Malvin Johns on 02/05/13 at 3:00 and he has the Rad/Onc consult with Dr. Wynonia Hazard on 4/2 at 8:30AM.

## 2013-02-04 NOTE — Progress Notes (Signed)
Dwana Melena, MD [address] [place] [address]  Rectal cancer  CURRENT THERAPY: Work-up  INTERVAL HISTORY: [facility] 72 y.o. male returns for  regular  visit for followup of  T3N?M0 rectal cancer, 7 cm above the anal verge, positive biopsy but no obvious positive nodes by ultrasound, but PET scan shows hypermetabolic activity in the presacral and perirectal lymph nodes.   In light of this conflicting information, we have recommended that [name] undergo surgical resection upfront and then pursue radiation +/- chemotherapy pending results from surgery.   We discussed the reasoning behind this recommendation.  With a negative [place], but a positive PET scan for worrisome perirectal and presacral adenopathy, it would be wise to have surgery upfront followed by therapy following pending pathology results.   He has a few questions regarding surgery that I explained to the best of my ability.  I informed the patient that Dr. Malvin Johns would be able to explain the surgery in much more detail.   He does have an appointment with Dr. Thersa Salt tomorrow AM for radiation consultation.  We have asked him to keep this appointment as radiation may play a major role in his treatment plan after surgery.   Oncologically, [name] denies any complaints and ROS questioning is negative.   Past Medical History  Diagnosis Date  . Hypertension   . High cholesterol   . Diabetes mellitus   . Acid reflux   . Occult GI bleeding     per patient blood in rectum my only problem  . Skin cancer     to nose    has High cholesterol; Acid reflux; Skin cancer; Guaiac positive stools; Melena; Rectal cancer; and Benign neoplasm of colon on his problem list.     is allergic to docusate sodium; neosporin; and percocet.  [name] does not currently have medications on file.  Past Surgical History  Procedure Laterality Date  . Cardiac surgery    . Tonsillectomy    . Skin cancer excision      nose  . Coronary  artery bypass graft  2002    cabg -5 vessels  . Circumcision  10/24/2011    Procedure: CIRCUMCISION ADULT;  Surgeon: Ky Barban;  Location: [facility];  Service: Urology;  Laterality: N/A;  with repair of glans penis  . Cystoscopy  10/24/2011    Procedure: CYSTOSCOPY FLEXIBLE;  Surgeon: Ky Barban;  Location: [facility];  Service: Urology;;  . Esophagogastroduodenoscopy N/A 12/13/2012    Procedure: ESOPHAGOGASTRODUODENOSCOPY (EGD);  Surgeon: Malissa Hippo, MD;  Location: [facility];  Service: Endoscopy;  Laterality: N/A;  . Flexible sigmoidoscopy N/A 12/13/2012    Procedure: FLEXIBLE SIGMOIDOSCOPY;  Surgeon: Malissa Hippo, MD;  Location: [facility];  Service: Endoscopy;  Laterality: N/A;  . Colonoscopy N/A 12/27/2012    Procedure: COLONOSCOPY;  Surgeon: Malissa Hippo, MD;  Location: [facility];  Service: Endoscopy;  Laterality: N/A;  730-rescheduled to 200 Ann to notify pt  . Eus N/A 01/16/2013    Procedure: LOWER ENDOSCOPIC ULTRASOUND (EUS) radial only, 60 minutes, staging of rectal cancer, colonoscopy to follow;  Surgeon: Rachael Fee, MD;  Location: [facility];  Service: Endoscopy;  Laterality: N/A;  . Colonoscopy N/A 01/16/2013    Procedure: COLONOSCOPY;  Surgeon: Rachael Fee, MD;  Location: [facility];  Service: Endoscopy;  Laterality: N/A;  . Cataract extraction Right     Denies any headaches, dizziness, double vision, fevers, chills, night sweats, nausea, vomiting,  diarrhea, constipation, chest pain, heart palpitations, shortness of breath, blood in stool, black tarry stool, urinary pain, urinary burning, urinary frequency, hematuria.   PHYSICAL EXAMINATION  ECOG PERFORMANCE STATUS: 1 - Symptomatic but completely ambulatory  There were no vitals filed for this visit.  GENERAL:alert, no distress, well nourished, well developed, comfortable, cooperative and smiling SKIN: skin color, texture, turgor are normal, no rashes or significant lesions HEAD:  Normocephalic, No masses, lesions, tenderness or abnormalities EYES: normal, Conjunctiva are pink and non-injected EARS: External ears normal OROPHARYNX:mucous membranes are moist  NECK: supple, trachea midline LYMPH:  not examined BREAST:not examined LUNGS: not examined HEART: not examined ABDOMEN:not examined BACK: Back symmetric, no curvature. EXTREMITIES:no joint deformities, effusion, or inflammation, no skin discoloration, no cyanosis  NEURO: alert & oriented x 3 with fluent speech, no focal motor/sensory deficits, gait normal   LABORATORY DATA: CBC    Component Value Date/Time   WBC 6.4 01/24/2013 1630   RBC 4.21* 01/24/2013 1630   HGB 12.0* 01/24/2013 1630   HCT 35.0* 01/24/2013 1630   PLT 183 01/24/2013 1630   MCV 83.1 01/24/2013 1630   MCH 28.5 01/24/2013 1630   MCHC 34.3 01/24/2013 1630   RDW 13.9 01/24/2013 1630   LYMPHSABS 1.4 01/24/2013 1630   MONOABS 0.6 01/24/2013 1630   EOSABS 0.2 01/24/2013 1630   BASOSABS 0.0 01/24/2013 1630      Chemistry      Component Value Date/Time   NA 137 01/24/2013 1630   K 3.8 01/24/2013 1630   CL 97 01/24/2013 1630   CO2 27 01/24/2013 1630   BUN 22 01/24/2013 1630   CREATININE 1.21 01/24/2013 1630   CREATININE 1.24 12/30/2012 1506      Component Value Date/Time   CALCIUM 9.6 01/24/2013 1630   ALKPHOS 92 01/24/2013 1630   AST 11 01/24/2013 1630   ALT 10 01/24/2013 1630   BILITOT 0.7 01/24/2013 1630     Lab Results  Component Value Date   CEA 4.2 01/24/2013      RADIOGRAPHIC STUDIES:  02/04/2013  *RADIOLOGY REPORT*  Clinical Data: Initial treatment strategy for rectal cancer.  NUCLEAR MEDICINE PET SKULL BASE TO THIGH  Fasting Blood Glucose: 67  Technique: 21.0 mCi F-18 FDG was injected intravenously. CT data  was obtained and used for attenuation correction and anatomic  localization only. (This was not acquired as a diagnostic CT  examination.) Additional exam technical data entered on  technologist worksheet.  Comparison:  01/02/2013  Findings:  Neck: High activity along the right maxilla likely related to  dental implants/dental disease. Mild glottic activity is likely  physiologic. No findings worrisome for metastatic disease in the  neck.  Chest: No hypermetabolic mediastinal or hilar nodes. No  suspicious pulmonary nodules on the CT scan.  Abdomen/Pelvis: Physiologic activity in the cecum, ascending  colon, and sigmoid colon observed. Sigmoid diverticulosis  observed.  Circumferential abnormal high activity in the rectum is striking  and greater in intensity than in the remainder of the colon, highly  concerning for recurrent malignancy. Maximum standard uptake value  of 34.9. Hypermetabolic lymph nodes in the perirectal space noted,  with a 0.8 cm right perirectal node (image 206 of the CT data)  having maximum standard uptake value of 3.9. A presacral node  (image 195 of the CT data) with maximum short axis diameter of 0.7  cm has a maximum standard uptake value of 6.4.  Intact fat plane is observed between the rectum and the seminal  vesicles. No direct  bladder wall invasion. Low-level presacral  stranding noted.  High activity associated with subcutaneous edema along the anterior  abdominal wall is likely related to injections.  Skeleton: No focal hypermetabolic activity to suggest skeletal  metastasis. Degenerative subcortical cyst along the superior  acetabular rims.  IMPRESSION:  1. Recurrent rectal malignancy with prominent rectal  hypermetabolic activity and hypermetabolic perirectal and presacral  adenopathy. No hepatic metastatic lesion is observed.  Original Report Authenticated By: Gaylyn Rong, M.D.     ASSESSMENT:  1. T3N?M0 rectal cancer, 7 cm above the anal verge, positive biopsy but no obvious positive nodes by ultrasound, but PET scan shows hypermetabolic activity in the presacral and perirectal lymph nodes. 2.Dabetes mellitus x20+ years  3. Mild excessive weight  4.  Right cataract operations with lens implant  5. Coronary artery disease status post open heart surgery  6. Hypercholesterolemia  7. Skin cancer status post Mohs surgery at [facility] several years ago  8. GERD     PLAN:  1. I personally reviewed and went over radiographic studies with the patient. 2. Will refer the patient to Dr. Malvin Johns for surgical consultation.  Dr. Mariel Sleet will call Dr. Malvin Johns to discuss the patient's case.  3. I discussed the patient's case with Dr. Thersa Salt (Rad Onc). 4. Patient encouraged to maintain tomorrow AM appointment with Dr. Thersa Salt for consultation. 5. Return in 1 month for follow-up.    All questions were answered. The patient knows to call the clinic with any problems, questions or concerns. We can certainly see the patient much sooner if necessary.  The patient and plan discussed with Glenford Peers, MD and he is in agreement with the aforementioned.  Patient seen by Dr. Mariel Sleet as well.   Shuntae Herzig,[place]

## 2013-02-04 NOTE — Progress Notes (Signed)
Patient's condition was discussed with Dr. Mariel Sleet last week

## 2013-02-07 ENCOUNTER — Encounter: Payer: Self-pay | Admitting: *Deleted

## 2013-02-10 ENCOUNTER — Ambulatory Visit (INDEPENDENT_AMBULATORY_CARE_PROVIDER_SITE_OTHER): Payer: BC Managed Care – PPO | Admitting: Internal Medicine

## 2013-02-10 ENCOUNTER — Encounter: Payer: Self-pay | Admitting: Internal Medicine

## 2013-02-10 VITALS — BP 140/50 | HR 88 | Ht 68.0 in | Wt 191.0 lb

## 2013-02-10 DIAGNOSIS — Z008 Encounter for other general examination: Secondary | ICD-10-CM

## 2013-02-10 NOTE — Progress Notes (Signed)
HPI Patient is a 72 yo who is referred fro preop risk stratification. The patient has a history of CAD  He underwent CABG at [facility])  He is followed by Dr Rockne Menghini in [place] He was seen in Feb The patient notes one episode of chest pressure yesterday  Occurred while seated.  After meal  Lasted 30 sec. Otherwise he denies complaints. He remains fairly active. Allergies  Allergen Reactions  . Docusate Sodium Hives  . Neosporin (Neomycin-Bacitracin Zn-Polymyx) Itching  . Percocet (Oxycodone-Acetaminophen) Nausea And Vomiting    Current Outpatient Prescriptions  Medication Sig Dispense Refill  . aspirin EC 81 MG tablet Take 81 mg by mouth daily.      . insulin detemir (LEVEMIR) 100 UNIT/ML injection Inject 46 Units into the skin 2 (two) times daily.       . metFORMIN (GLUCOPHAGE) 500 MG tablet Take 500 mg by mouth daily with breakfast.       . metoprolol succinate (TOPROL-XL) 50 MG 24 hr tablet Take 50 mg by mouth daily before breakfast. Take with or immediately following a meal.      . omeprazole (PRILOSEC) 20 MG capsule Take 20 mg by mouth at bedtime.       . pioglitazone (ACTOS) 30 MG tablet Take 30 mg by mouth 2 (two) times daily.      . simvastatin (ZOCOR) 40 MG tablet Take 40 mg by mouth at bedtime.        No current facility-administered medications for this visit.    Past Medical History  Diagnosis Date  . Hypertension   . High cholesterol   . Diabetes mellitus   . Acid reflux   . Occult GI bleeding     per patient blood in rectum my only problem  . Skin cancer     to nose  . Diabetes mellitus 02/04/2013    Past Surgical History  Procedure Laterality Date  . Cardiac surgery    . Tonsillectomy    . Skin cancer excision      nose  . 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 Circumcision  10/24/2011    Procedure: CIRCUMCISION ADULT;  Surgeon: Ky Barban;  Location: [facility];  Service: Urology;  Laterality: N/A;  with repair of glans penis  .  Cystoscopy  10/24/2011    Procedure: CYSTOSCOPY FLEXIBLE;  Surgeon: Ky Barban;  Location: [facility];  Service: Urology;;  . Esophagogastroduodenoscopy N/A 12/13/2012    Procedure: ESOPHAGOGASTRODUODENOSCOPY (EGD);  Surgeon: Malissa Hippo, MD;  [facility]: [facility];  Service: Endoscopy;  Laterality: N/A;  . Flexible sigmoidoscopy N/A 12/13/2012    Procedure: FLEXIBLE SIGMOIDOSCOPY;  Surgeon: Malissa Hippo, MD;  [facility];  Service: Endoscopy;  Laterality: N/A;  . Colonoscopy N/A 12/27/2012    Procedure: COLONOSCOPY;  Surgeon: Malissa Hippo, MD;  [facility];  Service: Endoscopy;  Laterality: N/A;  730-rescheduled to 200 Ann to notify pt  . Eus N/A 01/16/2013    Procedure: LOWER ENDOSCOPIC ULTRASOUND (EUS) radial only, 60 minutes, staging of rectal cancer, colonoscopy to follow;  Surgeon: Rachael Fee, MD;  [facility];  Service: Endoscopy;  Laterality: N/A;  . Colonoscopy N/A 01/16/2013    Procedure: COLONOSCOPY;  Surgeon: Rachael Fee, MD;  [facility];  Service: Endoscopy;  Laterality: N/A;  . Cataract extraction Right     Family History  Problem Relation Age of Onset  . Stroke Mother   .  Heart failure Father   . Heart failure Brother   . Anesthesia problems Neg Hx   . Hypotension Neg Hx   . Malignant hyperthermia Neg Hx   . Pseudochol deficiency Neg Hx   . Colon cancer Neg Hx   . Colon polyps Neg Hx     History   Social History  . Marital Status: Married    Spouse Name: N/A    Number of Children: N/A  . Years of Education: N/A   Occupational History  . Not on file.   Social History Main Topics  . Smoking status: Never Smoker   . Smokeless tobacco: Never Used  . Alcohol Use: No  . Drug Use: No  . Sexually Active: Yes   Other Topics Concern  . Not on file   Social History Narrative  . No narrative on file    Review of Systems:  All systems reviewed.  They are negative to the above problem except as  previously stated.  Vital Signs: BP 140/50  Pulse 88  Ht 5\' 8"  (1.727 m)  Wt 191 lb 0.6 oz (86.655 kg)  BMI 29.05 kg/m2  Physical Exam Patient is in NAd HEENT:  Normocephalic, atraumatic. EOMI, PERRLA.  Neck: JVP is normal.  No bruits.  Lungs: clear to auscultation. No rales no wheezes.  Heart: Regular rate and rhythm. Normal S1, S2. No S3.   No significant murmurs. PMI not displaced.  Abdomen:  Supple, nontender. Normal bowel sounds. No masses. No hepatomegaly.  Extremities:   Good distal pulses throughout. No lower extremity edema.  Musculoskeletal :moving all extremities.  Neuro:   alert and oriented x3.  CN II-XII grossly intact.  EKG  SR with ventricular bigeminy (LBB morphology) 88 bpm.   Assessment and Plan:  1.  CAD  The patient had one episode of CP that was atypical.  He is in bigeminy now thougjh I would recom checking electrolytes, CBC; a cardiolite to r/o ischemia and a holter monitor.  Keep on current regimen  2.  HTN  Adequate control  2.  HL  Continue statin.

## 2013-02-10 NOTE — Patient Instructions (Addendum)
Need to schedule 24 hr holter monitor   Need to schedule [employer]    Lab work today ( bmet,cbc,tsh )

## 2013-02-10 NOTE — Telephone Encounter (Signed)
Pt has not responded to previous contact attempts. Referral filed.  

## 2013-02-11 ENCOUNTER — Ambulatory Visit (HOSPITAL_COMMUNITY)
Admission: RE | Admit: 2013-02-11 | Discharge: 2013-02-11 | Disposition: A | Discharge status: Home / Self Care | Payer: MEDICARE | Source: Ambulatory Visit | Attending: Internal Medicine | Admitting: Internal Medicine

## 2013-02-11 ENCOUNTER — Encounter (HOSPITAL_COMMUNITY): Payer: Self-pay | Admitting: Cardiology

## 2013-02-11 ENCOUNTER — Encounter (HOSPITAL_COMMUNITY)
Admission: RE | Admit: 2013-02-11 | Discharge: 2013-02-11 | Disposition: A | Discharge status: Home / Self Care | Payer: MEDICARE | Source: Ambulatory Visit | Attending: Internal Medicine | Admitting: Internal Medicine

## 2013-02-11 ENCOUNTER — Other Ambulatory Visit: Payer: Self-pay | Admitting: Internal Medicine

## 2013-02-11 DIAGNOSIS — Z01818 Encounter for other preprocedural examination: Secondary | ICD-10-CM

## 2013-02-11 DIAGNOSIS — Z951 Presence of aortocoronary bypass graft: Secondary | ICD-10-CM | POA: Insufficient documentation

## 2013-02-11 DIAGNOSIS — I2510 Atherosclerotic heart disease of native coronary artery without angina pectoris: Secondary | ICD-10-CM | POA: Insufficient documentation

## 2013-02-11 DIAGNOSIS — I10 Essential (primary) hypertension: Secondary | ICD-10-CM | POA: Insufficient documentation

## 2013-02-11 DIAGNOSIS — Z01810 Encounter for preprocedural cardiovascular examination: Secondary | ICD-10-CM | POA: Insufficient documentation

## 2013-02-11 DIAGNOSIS — Z794 Long term (current) use of insulin: Secondary | ICD-10-CM | POA: Insufficient documentation

## 2013-02-11 DIAGNOSIS — Z008 Encounter for other general examination: Secondary | ICD-10-CM

## 2013-02-11 DIAGNOSIS — R079 Chest pain, unspecified: Secondary | ICD-10-CM

## 2013-02-11 DIAGNOSIS — R0789 Other chest pain: Secondary | ICD-10-CM | POA: Insufficient documentation

## 2013-02-11 DIAGNOSIS — E119 Type 2 diabetes mellitus without complications: Secondary | ICD-10-CM | POA: Insufficient documentation

## 2013-02-11 LAB — CBC WITH DIFFERENTIAL/PLATELET
Basophils Absolute: 0 10*3/uL (ref 0.0–0.1)
Eosinophils Absolute: 0.1 10*3/uL (ref 0.0–0.7)
Eosinophils Relative: 1 % (ref 0–5)
Lymphocytes Relative: 15 % (ref 12–46)
MCH: 27.6 pg (ref 26.0–34.0)
MCV: 83.7 fL (ref 78.0–100.0)
Platelets: 201 10*3/uL (ref 150–400)
RDW: 14.7 % (ref 11.5–15.5)
WBC: 9 10*3/uL (ref 4.0–10.5)

## 2013-02-11 LAB — BASIC METABOLIC PANEL
BUN: 27 mg/dL — ABNORMAL HIGH (ref 6–23)
Creat: 1.52 mg/dL — ABNORMAL HIGH (ref 0.50–1.35)
Glucose, Bld: 240 mg/dL — ABNORMAL HIGH (ref 70–99)
Potassium: 5.5 mEq/L — ABNORMAL HIGH (ref 3.5–5.3)

## 2013-02-11 MED ORDER — TECHNETIUM TC 99M SESTAMIBI - CARDIOLITE
30.0000 | Freq: Once | INTRAVENOUS | Status: AC | PRN
  Administered 2013-02-11: 10:00:00 30 via INTRAVENOUS

## 2013-02-11 MED ORDER — REGADENOSON 0.4 MG/5ML IV SOLN
INTRAVENOUS | Status: AC
  Administered 2013-02-11: 0.4 mg via INTRAVENOUS
  Filled 2013-02-11: qty 5

## 2013-02-11 MED ORDER — TECHNETIUM TC 99M SESTAMIBI - CARDIOLITE
10.0000 | Freq: Once | INTRAVENOUS | Status: AC | PRN
  Administered 2013-02-11: 09:00:00 10 via INTRAVENOUS

## 2013-02-11 MED ORDER — SODIUM CHLORIDE 0.9 % IJ SOLN
INTRAMUSCULAR | Status: AC
  Administered 2013-02-11: 10 mL via INTRAVENOUS
  Filled 2013-02-11: qty 10

## 2013-02-11 NOTE — Progress Notes (Signed)
*  PRELIMINARY RESULTS* Echocardiogram 24H Holter monitor has been performed.  Conrad Conover 02/11/2013, 12:49 PM

## 2013-02-11 NOTE — Progress Notes (Signed)
Stress Lab Nurses Notes - [facility]  [facility] 02/11/2013 Reason for doing test: CAD and Surgical Clearance Type of test: [name] Nurse performing test: [name], RN Nuclear Medicine Tech: [name] Echo Tech: Not Applicable MD performing test: [name] & [name] NP Family MD: [name] Test explained and consent signed: yes IV started: 22g jelco, Saline lock flushed, No redness or edema and Saline lock started in radiology Symptoms: Lightheaded Treatment/Intervention: None Reason test stopped: protocol completed After recovery IV was: Discontinued via X-ray tech and No redness or edema Patient to return to Nuc. Med at : 11:00 Patient discharged: Home Patient's Condition upon discharge was: stable Comments: During test BP 153/72 & HR 104.  Recovery BP 135/72 & HR 90.  Symptoms resolved in recovery. [name] T

## 2013-02-14 ENCOUNTER — Other Ambulatory Visit: Payer: Self-pay | Admitting: *Deleted

## 2013-02-14 ENCOUNTER — Encounter: Payer: Self-pay | Admitting: *Deleted

## 2013-02-14 DIAGNOSIS — E875 Hyperkalemia: Secondary | ICD-10-CM

## 2013-02-14 DIAGNOSIS — R7989 Other specified abnormal findings of blood chemistry: Secondary | ICD-10-CM

## 2013-02-17 ENCOUNTER — Encounter: Payer: Self-pay | Admitting: *Deleted

## 2013-02-17 ENCOUNTER — Telehealth: Payer: Self-pay | Admitting: Internal Medicine

## 2013-02-17 DIAGNOSIS — I498 Other specified cardiac arrhythmias: Secondary | ICD-10-CM

## 2013-02-17 NOTE — Telephone Encounter (Signed)
CALLING TO CHECK STATUS OF SURGICAL CLEARANCE

## 2013-02-17 NOTE — Telephone Encounter (Signed)
Please advise 

## 2013-02-17 NOTE — Telephone Encounter (Signed)
Results of Echo called to patient.  Will send clearance letter to Dr Malvin Johns.

## 2013-02-18 NOTE — Procedures (Signed)
[name], Carlos Wolf                ACCOUNT NO.:  [id number]  MEDICAL RECORD NO.:  [id number]  LOCATION:  RAD                           FACILITY:  [facility]  PHYSICIAN:  Gerrit Friends. Dietrich Pates, MD, FACCDATE OF BIRTH:  1941-06-21  DATE OF PROCEDURE:  02/17/2013 DATE OF DISCHARGE:  02/11/2013                               HOLTER MONITOR   REFERRING PHYSICIAN:  Pricilla Riffle, MD, [facility]  CLINICAL DATA:  A 72 year old gentleman with palpitations.  1. Continuous electrocardiographic recording was maintained for 23     hours and 51 minute during which the predominant rhythm was normal     sinus, with modest sinus bradycardia to a minimal rate of 50 bpm     and modest sinus tachycardia to a peak heart rate of 110 BPM. 2. Fairly frequent PVCs occurred with an average rate of 120 per hour     and intermittent episodes of ventricular bigeminy. 3. Rare supraventricular ectopics were also noted at an average rate     of 10 per hour. 4. No significant ST-segment elevation or depression was identified. 5. A complete diary of activity was written, but no symptoms were     reported.  IMPRESSION:  Continuous electrocardiographic recording demonstrating no significant arrhythmias other than monomorphic ventricular ectopic complexes, frequent at times.     Gerrit Friends. Dietrich Pates, MD, [facility]     RMR/MEDQ  D:  02/17/2013  T:  02/18/2013  Job:  [id number]

## 2013-02-19 ENCOUNTER — Encounter: Payer: Self-pay | Admitting: *Deleted

## 2013-02-19 LAB — BASIC METABOLIC PANEL
Calcium: 9.6 mg/dL (ref 8.4–10.5)
Creat: 1.28 mg/dL (ref 0.50–1.35)
Sodium: 139 mEq/L (ref 135–145)

## 2013-02-19 LAB — T3, FREE: T3, Free: 2.6 pg/mL (ref 2.3–4.2)

## 2013-02-20 ENCOUNTER — Telehealth: Payer: Self-pay | Admitting: *Deleted

## 2013-02-20 NOTE — Telephone Encounter (Signed)
Noted pt certified mail receipt ID [phone] per pt unable to be reached to advise lab results and repeat testing, noted pt signed and did return to lab for repeat

## 2013-02-25 ENCOUNTER — Telehealth: Payer: Self-pay | Admitting: Cardiology

## 2013-02-25 NOTE — Telephone Encounter (Signed)
Message copied by Burnice Logan on Tue Feb 25, 2013  3:01 PM ------      Message from: Dietrich Pates V      Created: Tue Feb 25, 2013 11:48 AM       BMET normal, Thyroid function tests are normal. ------

## 2013-02-25 NOTE — Telephone Encounter (Signed)
Pt informed of normal lab results

## 2013-03-07 ENCOUNTER — Ambulatory Visit (HOSPITAL_COMMUNITY): Payer: MEDICARE | Admitting: Oncology

## 2013-06-11 ENCOUNTER — Other Ambulatory Visit: Payer: Self-pay

## 2013-07-14 ENCOUNTER — Emergency Department (HOSPITAL_COMMUNITY): Payer: BC Managed Care – PPO

## 2013-07-14 ENCOUNTER — Encounter (HOSPITAL_COMMUNITY): Payer: Self-pay

## 2013-07-14 ENCOUNTER — Emergency Department (HOSPITAL_COMMUNITY)
Admission: EM | Admit: 2013-07-14 | Discharge: 2013-07-14 | Disposition: A | Discharge status: Home / Self Care | Payer: BC Managed Care – PPO | Attending: Emergency Medicine | Admitting: Emergency Medicine

## 2013-07-14 DIAGNOSIS — Z85828 Personal history of other malignant neoplasm of skin: Secondary | ICD-10-CM | POA: Insufficient documentation

## 2013-07-14 DIAGNOSIS — E860 Dehydration: Secondary | ICD-10-CM | POA: Insufficient documentation

## 2013-07-14 DIAGNOSIS — R197 Diarrhea, unspecified: Secondary | ICD-10-CM | POA: Insufficient documentation

## 2013-07-14 DIAGNOSIS — I10 Essential (primary) hypertension: Secondary | ICD-10-CM | POA: Insufficient documentation

## 2013-07-14 DIAGNOSIS — E872 Acidosis, unspecified: Secondary | ICD-10-CM | POA: Insufficient documentation

## 2013-07-14 DIAGNOSIS — E7800 Pure hypercholesterolemia, unspecified: Secondary | ICD-10-CM | POA: Insufficient documentation

## 2013-07-14 DIAGNOSIS — R5381 Other malaise: Secondary | ICD-10-CM | POA: Insufficient documentation

## 2013-07-14 DIAGNOSIS — E119 Type 2 diabetes mellitus without complications: Secondary | ICD-10-CM | POA: Insufficient documentation

## 2013-07-14 DIAGNOSIS — Z79899 Other long term (current) drug therapy: Secondary | ICD-10-CM | POA: Insufficient documentation

## 2013-07-14 DIAGNOSIS — Z794 Long term (current) use of insulin: Secondary | ICD-10-CM | POA: Insufficient documentation

## 2013-07-14 DIAGNOSIS — K219 Gastro-esophageal reflux disease without esophagitis: Secondary | ICD-10-CM | POA: Insufficient documentation

## 2013-07-14 LAB — HEPATIC FUNCTION PANEL
ALT: 10 U/L (ref 0–53)
AST: 14 U/L (ref 0–37)
Bilirubin, Direct: 0.1 mg/dL (ref 0.0–0.3)
Indirect Bilirubin: 0.6 mg/dL (ref 0.3–0.9)
Total Protein: 6.7 g/dL (ref 6.0–8.3)

## 2013-07-14 LAB — BASIC METABOLIC PANEL
CO2: 21 mEq/L (ref 19–32)
Calcium: 9.6 mg/dL (ref 8.4–10.5)
Chloride: 95 mEq/L — ABNORMAL LOW (ref 96–112)
Creatinine, Ser: 1.19 mg/dL (ref 0.50–1.35)
GFR calc Af Amer: 68 mL/min — ABNORMAL LOW (ref >90)
Sodium: 134 mEq/L — ABNORMAL LOW (ref 135–145)

## 2013-07-14 LAB — CBC
Platelets: 340 10*3/uL (ref 150–400)
RBC: 3.23 MIL/uL — ABNORMAL LOW (ref 4.22–5.81)
RDW: 13.6 % (ref 11.5–15.5)
WBC: 9 10*3/uL (ref 4.0–10.5)

## 2013-07-14 LAB — URINE MICROSCOPIC-ADD ON

## 2013-07-14 LAB — LACTIC ACID, PLASMA: Lactic Acid, Venous: 3 mmol/L — ABNORMAL HIGH (ref 0.5–2.2)

## 2013-07-14 LAB — URINALYSIS, ROUTINE W REFLEX MICROSCOPIC
Bilirubin Urine: NEGATIVE
Nitrite: NEGATIVE
Specific Gravity, Urine: 1.02 (ref 1.005–1.030)
Urobilinogen, UA: 0.2 mg/dL (ref 0.0–1.0)
pH: 5.5 (ref 5.0–8.0)

## 2013-07-14 LAB — TROPONIN I: Troponin I: <0.3 ng/mL (ref <0.30)

## 2013-07-14 MED ORDER — FENTANYL CITRATE 0.05 MG/ML IJ SOLN
50.0000 ug | Freq: Once | INTRAMUSCULAR | Status: AC
  Administered 2013-07-14: 50 ug via INTRAVENOUS
  Filled 2013-07-14: qty 2

## 2013-07-14 MED ORDER — SODIUM CHLORIDE 0.9 % IV BOLUS (SEPSIS)
500.0000 mL | Freq: Once | INTRAVENOUS | Status: AC
  Administered 2013-07-14: 500 mL via INTRAVENOUS

## 2013-07-14 MED ORDER — ALPRAZOLAM 0.5 MG PO TABS
0.2500 mg | ORAL_TABLET | Freq: Once | ORAL | Status: AC
  Administered 2013-07-14: 18:00:00 via ORAL
  Filled 2013-07-14: qty 1

## 2013-07-14 MED ORDER — SODIUM CHLORIDE 0.9 % IV BOLUS (SEPSIS)
1000.0000 mL | Freq: Once | INTRAVENOUS | Status: AC
  Administered 2013-07-14: 1000 mL via INTRAVENOUS

## 2013-07-14 NOTE — ED Provider Notes (Signed)
CSN: [id number]     Arrival date & time 07/14/13  1350 History  This chart was scribed for Dagmar Hait, MD by Bennett Scrape, ED Scribe and Valera Castle, ED Scribe. This patient was seen in room APA15/APA15 and the patient's care was started at 2:05 PM.   Chief Complaint  Patient presents with  . GI Problem    Patient is a 72 y.o. male presenting with GI illness. The history is provided by the patient. No language interpreter was used.  GI Problem This is a new problem. The problem occurs constantly. The problem has been gradually worsening. Pertinent negatives include no chest pain, no abdominal pain and no shortness of breath. Nothing aggravates the symptoms. Nothing relieves the symptoms. He has tried nothing for the symptoms.    HPI Comments: EARLE TROIANO is a 72 y.o. male who presents to the Emergency Department complaining that his colostomy "doesn't look right" after being evaluated by his home health nurse today. He reports he noticed a brown tint to the stoma area this morning and home health nurse also reported concern over a decreased appetite for the past weak. His family states that he has been eating potato soup and drinking some flavored water but has not been on any nutritional booster drinks.  Family reports that the pt had a stoma surgery on colon CA done at [facility] on August 25th, 2014 and was discharged 6 days ago. He was on chemo prior to surgery but is no longer receiving any chem or radiation currently. He had a node that was positive for CA, which was removed. He may be started on chemo again when all node results come back that were taken during the surgery. He had Lidoderm patches for pain after the surgery that broke his skin out, but he is currently on oxycodone for pain. He reports experiencing diarrhea described as watery with a large amount of gas within the past week as well. He denies having any pain in the surgery area. He denies fever, weakness, SOB, CP,  abdominal pain, and any other associated symptoms. He has a h/o CAD with 5 prior stent placements but denies having a h/o of MI or CHF.  Rosalyn Charters, Colon-Rectal Surgeon at [facility], was his Careers adviser. .  Past Medical History  Diagnosis Date  . Hypertension   . High cholesterol   . Diabetes mellitus   . Acid reflux   . Occult GI bleeding     per patient blood in rectum my only problem  . Skin cancer     to nose  . Diabetes mellitus 02/04/2013   Past Surgical History  Procedure Laterality Date  . Cardiac surgery    . Tonsillectomy    . Skin cancer excision      nose  . 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 Circumcision  10/24/2011    Procedure: CIRCUMCISION ADULT;  Surgeon: Ky Barban;  Location: [facility];  Service: Urology;  Laterality: N/A;  with repair of glans penis  . Cystoscopy  10/24/2011    Procedure: CYSTOSCOPY FLEXIBLE;  Surgeon: Ky Barban;  Location: [facility];  Service: Urology;;  . Esophagogastroduodenoscopy N/A 12/13/2012    Procedure: ESOPHAGOGASTRODUODENOSCOPY (EGD);  Surgeon: Malissa Hippo, MD;  Location: [facility];  Service: Endoscopy;  Laterality: N/A;  . Flexible sigmoidoscopy N/A 12/13/2012    Procedure: FLEXIBLE SIGMOIDOSCOPY;  Surgeon: Malissa Hippo, MD;  Location: [facility];  Service:  Endoscopy;  Laterality: N/A;  . Colonoscopy N/A 12/27/2012    Procedure: COLONOSCOPY;  Surgeon: Malissa Hippo, MD;  Location: [facility];  Service: Endoscopy;  Laterality: N/A;  730-rescheduled to 200 Ann to notify pt  . Eus N/A 01/16/2013    Procedure: LOWER ENDOSCOPIC ULTRASOUND (EUS) radial only, 60 minutes, staging of rectal cancer, colonoscopy to follow;  Surgeon: Rachael Fee, MD;  Location: [facility];  Service: Endoscopy;  Laterality: N/A;  . Colonoscopy N/A 01/16/2013    Procedure: COLONOSCOPY;  Surgeon: Rachael Fee, MD;  Location: [facility];  Service: Endoscopy;  Laterality: N/A;  . Cataract extraction Right     Family History  Problem Relation Age of Onset  . Stroke Mother   . Heart failure Father   . Heart failure Brother   . Anesthesia problems Neg Hx   . Hypotension Neg Hx   . Malignant hyperthermia Neg Hx   . Pseudochol deficiency Neg Hx   . Colon cancer Neg Hx   . Colon polyps Neg Hx    History  Substance Use Topics  . Smoking status: Never Smoker   . Smokeless tobacco: Never Used  . Alcohol Use: No    Review of Systems  Constitutional: Positive for appetite change (Decreased appetite over the past week. ).  Respiratory: Negative for shortness of breath.   Cardiovascular: Negative for chest pain.  Gastrointestinal: Negative for abdominal pain.  Skin: Positive for wound (possible surgical site infection).  Neurological: Negative for weakness.  All other systems reviewed and are negative.    Allergies  Docusate sodium; Neosporin; Percocet; and Adhesive  Home Medications   Current Outpatient Rx  Name  Route  Sig  Dispense  Refill  . enoxaparin (LOVENOX) 40 MG/0.4ML injection   Subcutaneous   Inject 40 mg into the skin daily.         . insulin detemir (LEVEMIR) 100 UNIT/ML injection   Subcutaneous   Inject 12 Units into the skin 2 (two) times daily.          . insulin lispro (HUMALOG) 100 UNIT/ML injection   Subcutaneous   Inject 5 Units into the skin 3 (three) times daily as needed for high blood sugar.         . metoprolol succinate (TOPROL-XL) 50 MG 24 hr tablet   Oral   Take 50 mg by mouth daily before breakfast. Take with or immediately following a meal.         . omeprazole (PRILOSEC) 20 MG capsule   Oral   Take 20 mg by mouth at bedtime.          Marland Kitchen oxyCODONE (OXY IR/ROXICODONE) 5 MG immediate release tablet   Oral   Take 5 mg by mouth every 4 (four) hours as needed for pain.         . simethicone (MYLICON) 80 MG chewable tablet   Oral   Chew 80 mg by mouth every 6 (six) hours as needed for flatulence.         . simvastatin (ZOCOR) 40  MG tablet   Oral   Take 40 mg by mouth at bedtime.           Triage Vitals: BP 162/67  Pulse 107  Temp(Src) 98.1 F (36.7 C) (Oral)  Resp 18  SpO2 100%  Physical Exam  Nursing note and vitals reviewed. Constitutional: He is oriented to person, place, and time. He appears well-developed and well-nourished. No distress.  HENT:  Head: Normocephalic  and atraumatic.  Eyes: EOM are normal.  Neck: Neck supple. No tracheal deviation present.  Cardiovascular: Normal rate and regular rhythm.   Pulmonary/Chest: Effort normal and breath sounds normal. No respiratory distress.  Abdominal: Soft. There is no tenderness.  Midline incision closed with staples, well-appearing. Stoma with granulation tissue around the medial edge. No erythema or drainage. No tenderness.  Musculoskeletal: Normal range of motion.  Neurological: He is alert and oriented to person, place, and time. No cranial nerve deficit. He exhibits abnormal muscle tone. Coordination normal.  Skin: Skin is warm and dry.  Psychiatric: He has a normal mood and affect. His behavior is normal.    ED Course  Procedures (including critical care time)  DIAGNOSTIC STUDIES: Oxygen Saturation is 100% on room air, normal by my interpretation.    COORDINATION OF CARE: 2:12 PM-Discussed treatment plan which includes CXR, CBC panel, CMP, UA with pt at bedside and pt agreed to plan. If admission is needed, family wants the pt to be admitted to [facility]. Informed the pt and pt's family of possible clinical suspicions.    Labs Review Labs Reviewed  CBC - Abnormal; Notable for the following:    RBC 3.23 (*)    Hemoglobin 9.5 (*)    HCT 27.9 (*)    All other components within normal limits  BASIC METABOLIC PANEL - Abnormal; Notable for the following:    Sodium 134 (*)    Chloride 95 (*)    Glucose, Bld 214 (*)    GFR calc non Af Amer 59 (*)    GFR calc Af Amer 68 (*)    All other components within normal limits  LACTIC ACID, PLASMA -  Abnormal; Notable for the following:    Lactic Acid, Venous 3.0 (*)    All other components within normal limits  HEPATIC FUNCTION PANEL - Abnormal; Notable for the following:    Albumin 2.8 (*)    All other components within normal limits  URINALYSIS, ROUTINE W REFLEX MICROSCOPIC - Abnormal; Notable for the following:    Hgb urine dipstick TRACE (*)    Ketones, ur 40 (*)    Protein, ur TRACE (*)    All other components within normal limits  URINE MICROSCOPIC-ADD ON - Abnormal; Notable for the following:    Bacteria, UA MANY (*)    Casts GRANULAR CAST (*)    All other components within normal limits  TROPONIN I  LACTIC ACID, PLASMA   Imaging Review Dg Chest 2 View  07/14/2013   *RADIOLOGY REPORT*  Clinical Data: Fatigue  CHEST - 2 VIEW  Comparison: None.  Findings: The cardiac shadow is within normal limits.  Postsurgical changes are noted.  The lungs are clear bilaterally.  IMPRESSION: No acute abnormality noted.   Original Report Authenticated By: Alcide Clever, M.D.     Date: 07/14/2013  Rate: 104  Rhythm: sinus tachycardia  QRS Axis: normal  Intervals: normal  ST/T Wave abnormalities: normal  Conduction Disutrbances:none  Narrative Interpretation:   Old EKG Reviewed: unchanged   MDM   1. Dehydration   2. Malaise   3. Lactic acidosis    72 year old male presents with general malaise. Recent colectomy for rectal cancer at [facility]. He has been home for about a week and a half since then. He reports general malaise, anorexia over the past 3-4 days. Denies any fevers, chest pain, shortness of breath, abdominal pain, nausea, vomiting, diarrhea. Wife and daughter state anorexia with decreased oral intake. Patient states he  just feels lousy, however cannot fully describe any other symptoms. Family is also worried about the appearance of the stoma. He is not currently on any chemotherapy, last chemotherapy was 3 months ago. No current radiation therapy. Patient's  vitals showed mild sinus tachycardia with sinus rhythm on the monitor normal O2 sats on room air. Patient has a normal appearing midline incision with no erythema or sign of cellulitis. No abdominal pain. He still has some granulation tissue around the medial, however no gross purulent discharge or erythema. No stomal tenderness. We'll start with labs and gentle hydration.  Labs show elevated lactic acid. UA with bacteria, but negative nitrite and leukocytes. Will hold on antibiotics with negative nitrites, leukocytes, and 0-2 WBCs. I spoke with his surgeon, Dr. Luciano Cutter, at [facility] who felt reassured and will f/u with phone calls within the next few days. I spoke with our hospitalists, who recommended IV hydration and repeating a lactate since the remainder of his labs look normal. After 2 L NS, lactate cleared, patient tolerating PO, family very comfortable with discharge and pleased with his improvement. Instructed to f/u with PCP if they cannot f/u with [facility].   I have reviewed all labs and imaging and considered them in my medical decision making.   I personally performed the services described in this documentation, which was scribed in my presence. The recorded information has been reviewed and is accurate.     Dagmar Hait, MD 07/14/13 [phone]

## 2013-07-14 NOTE — ED Notes (Signed)
Called [employer]. Will beeped surgeon on call for Dr. Luciano Cutter

## 2013-07-14 NOTE — ED Notes (Signed)
Lab here to draw repeat Lactic Acid as ordered

## 2013-07-14 NOTE — ED Notes (Signed)
Drank 8 oz Glucerna Home made Vanilla shake and ate two saltine crackers.

## 2013-07-14 NOTE — ED Notes (Signed)
Pt reports that his colostomy bad "doesn't look right".  Pt reports noticing a "brown tint" to the stoma area.  Pt also reports decreased appetite for the past week but denies any weakness.

## 2013-07-14 NOTE — ED Notes (Signed)
2 staff assist to a standing position to attempt to use a urinal. Pt had an unsteady gait while standing. Pt unable to urinate  once standing. Pt returned to bed. MD at bedside.

## 2013-09-11 ENCOUNTER — Other Ambulatory Visit: Payer: Self-pay

## 2014-01-15 IMAGING — NM NM MYOCAR SINGLE W/SPECT W/WALL MOTION & EF
1 series · 6 of 6 positions shown · non-contrast
Comparison: none

Ordering Physician: ISMAR GAUSE

[place] Physician: [REDACTED]al Data: 71-year-old male with multivessel CAD status post
CABG at due in 0110.  He is referred as part of a preoperative
assessment can he has had atypical chest pain symptoms.
NUCLEAR MEDICINE STRESS MYOVIEW STUDY WITH SPECT AND LEFT
VENTRICULAR EJECTION FRACTION
Radionuclide Data: One-day rest/stress protocol performed with
[DATE] mCi of Oc-FFm Myoview.
Stress Data: Lexiscan bolus was given in standard fashion.  Heart
rate increased from 89 beats per minute up to 104 beats per minute,
blood pressure increased from 112/87 up to 173/76.  The patient
tolerated infusion well.  No diagnostic ST-segment abnormalities
were noted.  Frequent PVCs were noted, some in pattern of bigeminy.
No sustained arrhythmias.
EKG: Baseline ECG shows normal sinus rhythm with PVCs.
Scintigraphic Data: Analysis of the raw perfusion data finds mild
diaphragmatic attenuation.
Tomographic views obtained using the short axis, vertical long
axis, and horizontal long axis planes.  No significant, reversible
perfusion defects are noted to indicate ischemia.
Gated imaging reveals an EDV of 73, ESV of 32, T I D ratio 0.92,
and LVEF of 56% with normal wall motion.

[cr cardiac tc low dose · 6.41mm/px · 6 of 64 frames shown]
[frame 6/64]
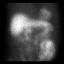
[frame 16/64]
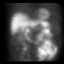
[frame 27/64]
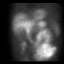
[frame 38/64]
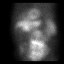
[frame 48/64]
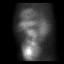
[frame 59/64]
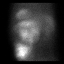

[6 of 6 positions shown; findings below may reference images not displayed]

IMPRESSION: Low risk Lexiscan Myoview.  No diagnostic ST-segment abnormalities
were noted.  Frequent PVCs were seen, some in pattern of bigeminy,
however there were no sustained arrhythmias.  Perfusion imaging
shows mild diaphragmatic attenuation without ischemia.  LVEF 56%.

## 2015-01-11 ENCOUNTER — Encounter (HOSPITAL_COMMUNITY): Payer: Self-pay | Admitting: Emergency Medicine

## 2015-01-11 ENCOUNTER — Other Ambulatory Visit: Payer: Self-pay

## 2015-01-11 ENCOUNTER — Emergency Department (HOSPITAL_COMMUNITY)
Admission: EM | Admit: 2015-01-11 | Discharge: 2015-01-11 | Disposition: A | Discharge status: Home / Self Care | Payer: Medicare Other | Attending: Emergency Medicine | Admitting: Emergency Medicine

## 2015-01-11 DIAGNOSIS — Z79899 Other long term (current) drug therapy: Secondary | ICD-10-CM | POA: Diagnosis not present

## 2015-01-11 DIAGNOSIS — R531 Weakness: Secondary | ICD-10-CM | POA: Diagnosis present

## 2015-01-11 DIAGNOSIS — Z85828 Personal history of other malignant neoplasm of skin: Secondary | ICD-10-CM | POA: Diagnosis not present

## 2015-01-11 DIAGNOSIS — M6281 Muscle weakness (generalized): Secondary | ICD-10-CM | POA: Insufficient documentation

## 2015-01-11 DIAGNOSIS — R001 Bradycardia, unspecified: Secondary | ICD-10-CM | POA: Insufficient documentation

## 2015-01-11 DIAGNOSIS — E785 Hyperlipidemia, unspecified: Secondary | ICD-10-CM | POA: Insufficient documentation

## 2015-01-11 DIAGNOSIS — Z951 Presence of aortocoronary bypass graft: Secondary | ICD-10-CM | POA: Insufficient documentation

## 2015-01-11 DIAGNOSIS — E119 Type 2 diabetes mellitus without complications: Secondary | ICD-10-CM | POA: Insufficient documentation

## 2015-01-11 DIAGNOSIS — Z9889 Other specified postprocedural states: Secondary | ICD-10-CM | POA: Diagnosis not present

## 2015-01-11 DIAGNOSIS — I10 Essential (primary) hypertension: Secondary | ICD-10-CM | POA: Insufficient documentation

## 2015-01-11 DIAGNOSIS — Z794 Long term (current) use of insulin: Secondary | ICD-10-CM | POA: Insufficient documentation

## 2015-01-11 LAB — CBG MONITORING, ED: Glucose-Capillary: 226 mg/dL — ABNORMAL HIGH (ref 70–99)

## 2015-01-11 LAB — BASIC METABOLIC PANEL
Anion gap: 9 (ref 5–15)
BUN: 16 mg/dL (ref 6–23)
CO2: 29 mmol/L (ref 19–32)
Calcium: 9.5 mg/dL (ref 8.4–10.5)
Chloride: 100 mmol/L (ref 96–112)
Creatinine, Ser: 1.36 mg/dL — ABNORMAL HIGH (ref 0.50–1.35)
GFR calc Af Amer: 58 mL/min — ABNORMAL LOW (ref >90)
GFR calc non Af Amer: 50 mL/min — ABNORMAL LOW (ref >90)
Glucose, Bld: 299 mg/dL — ABNORMAL HIGH (ref 70–99)
Potassium: 3.8 mmol/L (ref 3.5–5.1)
Sodium: 138 mmol/L (ref 135–145)

## 2015-01-11 LAB — URINALYSIS, ROUTINE W REFLEX MICROSCOPIC
Bilirubin Urine: NEGATIVE
Glucose, UA: 500 mg/dL — AB
Hgb urine dipstick: NEGATIVE
Ketones, ur: NEGATIVE mg/dL
Leukocytes, UA: NEGATIVE
Nitrite: NEGATIVE
Protein, ur: 30 mg/dL — AB
Specific Gravity, Urine: 1.025 (ref 1.005–1.030)
Urobilinogen, UA: 0.2 mg/dL (ref 0.0–1.0)
pH: 6 (ref 5.0–8.0)

## 2015-01-11 LAB — URINE MICROSCOPIC-ADD ON

## 2015-01-11 LAB — CBC
HCT: 36.3 % — ABNORMAL LOW (ref 39.0–52.0)
Hemoglobin: 12.1 g/dL — ABNORMAL LOW (ref 13.0–17.0)
MCH: 29.2 pg (ref 26.0–34.0)
MCHC: 33.3 g/dL (ref 30.0–36.0)
MCV: 87.7 fL (ref 78.0–100.0)
Platelets: 169 10*3/uL (ref 150–400)
RBC: 4.14 MIL/uL — ABNORMAL LOW (ref 4.22–5.81)
RDW: 14.2 % (ref 11.5–15.5)
WBC: 8.1 10*3/uL (ref 4.0–10.5)

## 2015-01-11 MED ORDER — SODIUM CHLORIDE 0.9 % IV BOLUS (SEPSIS)
1000.0000 mL | Freq: Once | INTRAVENOUS | Status: AC
  Administered 2015-01-11: 1000 mL via INTRAVENOUS

## 2015-01-11 NOTE — Discharge Instructions (Signed)
Fatigue °Fatigue is a feeling of tiredness, lack of energy, lack of motivation, or feeling tired all the time. Having enough rest, good nutrition, and reducing stress will normally reduce fatigue. Consult your caregiver if it persists. The nature of your fatigue will help your caregiver to find out its cause. The treatment is based on the cause.  °CAUSES  °There are many causes for fatigue. Most of the time, fatigue can be traced to one or more of your habits or routines. Most causes fit into one or more of three general areas. They are: °Lifestyle problems °· Sleep disturbances. °· Overwork. °· Physical exertion. °· Unhealthy habits. °· Poor eating habits or eating disorders. °· Alcohol and/or drug use . °· Lack of proper nutrition (malnutrition). °Psychological problems °· Stress and/or anxiety problems. °· Depression. °· Grief. °· Boredom. °Medical Problems or Conditions °· Anemia. °· Pregnancy. °· Thyroid gland problems. °· Recovery from major surgery. °· Continuous pain. °· Emphysema or asthma that is not well controlled °· Allergic conditions. °· Diabetes. °· Infections (such as mononucleosis). °· Obesity. °· Sleep disorders, such as sleep apnea. °· Heart failure or other heart-related problems. °· Cancer. °· Kidney disease. °· Liver disease. °· Effects of certain medicines such as antihistamines, cough and cold remedies, prescription pain medicines, heart and blood pressure medicines, drugs used for treatment of cancer, and some antidepressants. °SYMPTOMS  °The symptoms of fatigue include:  °· Lack of energy. °· Lack of drive (motivation). °· Drowsiness. °· Feeling of indifference to the surroundings. °DIAGNOSIS  °The details of how you feel help guide your caregiver in finding out what is causing the fatigue. You will be asked about your present and past health condition. It is important to review all medicines that you take, including prescription and non-prescription items. A thorough exam will be done.  You will be questioned about your feelings, habits, and normal lifestyle. Your caregiver may suggest blood tests, urine tests, or other tests to look for common medical causes of fatigue.  °TREATMENT  °Fatigue is treated by correcting the underlying cause. For example, if you have continuous pain or depression, treating these causes will improve how you feel. Similarly, adjusting the dose of certain medicines will help in reducing fatigue.  °HOME CARE INSTRUCTIONS  °· Try to get the required amount of good sleep every night. °· Eat a healthy and nutritious diet, and drink enough water throughout the day. °· Practice ways of relaxing (including yoga or meditation). °· Exercise regularly. °· Make plans to change situations that cause stress. Act on those plans so that stresses decrease over time. Keep your work and personal routine reasonable. °· Avoid street drugs and minimize use of alcohol. °· Start taking a daily multivitamin after consulting your caregiver. °SEEK MEDICAL CARE IF:  °· You have persistent tiredness, which cannot be accounted for. °· You have fever. °· You have unintentional weight loss. °· You have headaches. °· You have disturbed sleep throughout the night. °· You are feeling sad. °· You have constipation. °· You have dry skin. °· You have gained weight. °· You are taking any new or different medicines that you suspect are causing fatigue. °· You are unable to sleep at night. °· You develop any unusual swelling of your legs or other parts of your body. °SEEK IMMEDIATE MEDICAL CARE IF:  °· You are feeling confused. °· Your vision is blurred. °· You feel faint or pass out. °· You develop severe headache. °· You develop severe abdominal, pelvic, or   back pain. °· You develop chest pain, shortness of breath, or an irregular or fast heartbeat. °· You are unable to pass a normal amount of urine. °· You develop abnormal bleeding such as bleeding from the rectum or you vomit blood. °· You have thoughts  about harming yourself or committing suicide. °· You are worried that you might harm someone else. °MAKE SURE YOU:  °· Understand these instructions. °· Will watch your condition. °· Will get help right away if you are not doing well or get worse. °Document Released: 08/20/2007 Document Revised: 01/15/2012 Document Reviewed: 02/24/2014 °ExitCare® Patient Information ©2015 [employer]. This information is not intended to replace advice given to you by your health care provider. Make sure you discuss any questions you have with your health care provider. °Weakness °Weakness is a lack of strength. It may be felt all over the body (generalized) or in one specific part of the body (focal). Some causes of weakness can be serious. You may need further medical evaluation, especially if you are elderly or you have a history of immunosuppression (such as chemotherapy or HIV), kidney disease, heart disease, or diabetes. °CAUSES  °Weakness can be caused by many different things, including: °· Infection. °· Physical exhaustion. °· Internal bleeding or other blood loss that results in a lack of red blood cells (anemia). °· Dehydration. This cause is more common in elderly people. °· Side effects or electrolyte abnormalities from medicines, such as pain medicines or sedatives. °· Emotional distress, anxiety, or depression. °· Circulation problems, especially severe peripheral arterial disease. °· Heart disease, such as rapid atrial fibrillation, bradycardia, or heart failure. °· Nervous system disorders, such as Guillain-Barré syndrome, multiple sclerosis, or stroke. °DIAGNOSIS  °To find the cause of your weakness, your caregiver will take your history and perform a physical exam. Lab tests or X-rays may also be ordered, if needed. °TREATMENT  °Treatment of weakness depends on the cause of your symptoms and can vary greatly. °HOME CARE INSTRUCTIONS  °· Rest as needed. °· Eat a well-balanced diet. °· Try to get some exercise  every day. °· Only take over-the-counter or prescription medicines as directed by your caregiver. °SEEK MEDICAL CARE IF:  °· Your weakness seems to be getting worse or spreads to other parts of your body. °· You develop new aches or pains. °SEEK IMMEDIATE MEDICAL CARE IF:  °· You cannot perform your normal daily activities, such as getting dressed and feeding yourself. °· You cannot walk up and down stairs, or you feel exhausted when you do so. °· You have shortness of breath or chest pain. °· You have difficulty moving parts of your body. °· You have weakness in only one area of the body or on only one side of the body. °· You have a fever. °· You have trouble speaking or swallowing. °· You cannot control your bladder or bowel movements. °· You have black or bloody vomit or stools. °MAKE SURE YOU: °· Understand these instructions. °· Will watch your condition. °· Will get help right away if you are not doing well or get worse. °Document Released: 10/23/2005 Document Revised: 04/23/2012 Document Reviewed: 12/22/2011 °ExitCare® Patient Information ©2015 [employer]. This information is not intended to replace advice given to you by your health care provider. Make sure you discuss any questions you have with your health care provider. ° °

## 2015-01-11 NOTE — ED Notes (Signed)
MD at bedside. 

## 2015-01-11 NOTE — ED Notes (Signed)
Pt. Unable to provide urine sample at this time. Urinal at bedside.

## 2015-01-11 NOTE — ED Notes (Signed)
Pt ambulating around nurses station without difficulty or complaint. Pt.states he feels much better.

## 2015-01-11 NOTE — ED Notes (Signed)
Pt reports was standing in the kitchen this am when he began to feel weak. Pt reports sit down in the floor until wife came home and brought pt here. Pt alert and oriented. nad noted.

## 2015-01-11 NOTE — ED Notes (Signed)
Patient and family verbalize understanding of discharge instructions, home care and follow up care. Patient out of department at this time with spouse

## 2015-01-11 NOTE — ED Provider Notes (Signed)
CSN: [id number]     Arrival date & time 01/11/15  1033 History  This chart was scribed for Carlos Manifold, MD by Chester Holstein, ED Scribe. This patient was seen in room APA14/APA14 and the patient's care was started at 2:57 PM.    Chief Complaint  Patient presents with  . Weakness      Patient is a 74 y.o. male presenting with weakness. The history is provided by the patient and the spouse. No language interpreter was used.  Weakness Pertinent negatives include no shortness of breath.    HPI Comments: Carlos Wolf is a 74 y.o. male who presents to the Emergency Department complaining of weakness and fatigue with onset 3 days ago. Pt had a colonoscopy, with normal results done 3 days ago at [facility] and was then seen in ED for syncopal episode just after. Pt wife states today he felt unwell and sat down on floor. Pt denies any pain, fever, chills, SOB, nausea, and confusion. Pt A&O x4.   Past Medical History  Diagnosis Date  . Hypertension   . High cholesterol   . Diabetes mellitus   . Acid reflux   . Occult GI bleeding     per patient blood in rectum my only problem  . Skin cancer     to nose  . Diabetes mellitus 02/04/2013   Past Surgical History  Procedure Laterality Date  . Cardiac surgery    . Tonsillectomy    . Skin cancer excision      nose  . 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 Circumcision  10/24/2011    Procedure: CIRCUMCISION ADULT;  Surgeon: Marissa Nestle;  Location: [facility];  Service: Urology;  Laterality: N/A;  with repair of glans penis  . Cystoscopy  10/24/2011    Procedure: CYSTOSCOPY FLEXIBLE;  Surgeon: Marissa Nestle;  Location: [facility];  Service: Urology;;  . Esophagogastroduodenoscopy N/A 12/13/2012    Procedure: ESOPHAGOGASTRODUODENOSCOPY (EGD);  Surgeon: Rogene Houston, MD;  Location: [facility];  Service: Endoscopy;  Laterality: N/A;  . Flexible sigmoidoscopy N/A 12/13/2012    Procedure: FLEXIBLE SIGMOIDOSCOPY;  Surgeon: Rogene Houston, MD;  Location: [facility];  Service: Endoscopy;  Laterality: N/A;  . Colonoscopy N/A 12/27/2012    Procedure: COLONOSCOPY;  Surgeon: Rogene Houston, MD;  [facility]: [facility];  Service: Endoscopy;  Laterality: N/A;  730-rescheduled to 200 Ann to notify pt  . Eus N/A 01/16/2013    Procedure: LOWER ENDOSCOPIC ULTRASOUND (EUS) radial only, 60 minutes, staging of rectal cancer, colonoscopy to follow;  Surgeon: Milus Banister, MD;  Location: [facility];  Service: Endoscopy;  Laterality: N/A;  . Colonoscopy N/A 01/16/2013    Procedure: COLONOSCOPY;  Surgeon: Milus Banister, MD;  Location: [facility];  Service: Endoscopy;  Laterality: N/A;  . Cataract extraction Right    Family History  Problem Relation Age of Onset  . Stroke Mother   . Heart failure Father   . Heart failure Brother   . Anesthesia problems Neg Hx   . Hypotension Neg Hx   . Malignant hyperthermia Neg Hx   . Pseudochol deficiency Neg Hx   . Colon cancer Neg Hx   . Colon polyps Neg Hx    History  Substance Use Topics  . Smoking status: Never Smoker   . Smokeless tobacco: Never Used  . Alcohol Use: No    Review of Systems  Constitutional: Positive for fatigue.  Negative for fever and chills.  Respiratory: Negative for shortness of breath.   Gastrointestinal: Negative for nausea.  Neurological: Positive for weakness.  Psychiatric/Behavioral: Negative for confusion.  All other systems reviewed and are negative.     Allergies  Docusate sodium; Neosporin; Percocet; and Adhesive  Home Medications   Prior to Admission medications   Medication Sig Start Date End Date Taking? Authorizing Provider  enoxaparin (LOVENOX) 40 MG/0.4ML injection Inject 40 mg into the skin daily.    Historical Provider, MD  insulin detemir (LEVEMIR) 100 UNIT/ML injection Inject 12 Units into the skin 2 (two) times daily.     Historical Provider, MD  insulin lispro (HUMALOG) 100 UNIT/ML injection Inject 5 Units into the skin 3  (three) times daily as needed for high blood sugar.    Historical Provider, MD  metoprolol succinate (TOPROL-XL) 50 MG 24 hr tablet Take 50 mg by mouth daily before breakfast. Take with or immediately following a meal.    Historical Provider, MD  omeprazole (PRILOSEC) 20 MG capsule Take 20 mg by mouth at bedtime.     Historical Provider, MD  oxyCODONE (OXY IR/ROXICODONE) 5 MG immediate release tablet Take 5 mg by mouth every 4 (four) hours as needed for pain.    Historical Provider, MD  simethicone (MYLICON) 80 MG chewable tablet Chew 80 mg by mouth every 6 (six) hours as needed for flatulence.    Historical Provider, MD  simvastatin (ZOCOR) 40 MG tablet Take 40 mg by mouth at bedtime.     Historical Provider, MD   BP 158/60 mmHg  Pulse 60  Temp(Src) 97.9 F (36.6 C) (Oral)  Resp 18  Ht 5\' 10"  (1.778 m)  Wt 170 lb (77.111 kg)  BMI 24.39 kg/m2  SpO2 100% Physical Exam  Constitutional: He is oriented to person, place, and time. He appears well-developed and well-nourished. No distress.  HENT:  Head: Normocephalic and atraumatic.  Eyes: Conjunctivae are normal. Right eye exhibits no discharge. Left eye exhibits no discharge.  Neck: Neck supple.  Cardiovascular: Regular rhythm and normal heart sounds.  Bradycardia present.  Exam reveals no gallop and no friction rub.   No murmur heard. Pulmonary/Chest: Effort normal and breath sounds normal. No respiratory distress.  Abdominal: Soft. He exhibits no distension. There is no tenderness.  Musculoskeletal: He exhibits no edema or tenderness.  Neurological: He is alert and oriented to person, place, and time.  Skin: Skin is warm and dry.  Psychiatric: He has a normal mood and affect. His behavior is normal. Thought content normal.  Nursing note and vitals reviewed.   ED Course  Procedures (including critical care time) DIAGNOSTIC STUDIES: Oxygen Saturation is 100% on room air, normal by my interpretation.    COORDINATION OF CARE: 3:04 PM  Discussed treatment plan with patient at beside, the patient agrees with the plan and has no further questions at this time.   Labs Review Labs Reviewed  CBC - Abnormal; Notable for the following:    RBC 4.14 (*)    Hemoglobin 12.1 (*)    HCT 36.3 (*)    All other components within normal limits  BASIC METABOLIC PANEL - Abnormal; Notable for the following:    Glucose, Bld 299 (*)    Creatinine, Ser 1.36 (*)    GFR calc non Af Amer 50 (*)    GFR calc Af Amer 58 (*)    All other components within normal limits  CBG MONITORING, ED - Abnormal; Notable for the following:  Glucose-Capillary 226 (*)    All other components within normal limits  URINALYSIS, ROUTINE W REFLEX MICROSCOPIC    Imaging Review No results found.   EKG Interpretation   Date/Time:  Monday January 11 2015 10:45:25 EST Ventricular Rate:  98 PR Interval:  192 QRS Duration: 84 QT Interval:  356 QTC Calculation: 454 R Axis:   -27 Text Interpretation:  Sinus rhythm with frequent Premature ventricular  complexes Cannot rule out Anterior infarct , age undetermined Abnormal ECG  ED PHYSICIAN INTERPRETATION AVAILABLE IN [facility] [place] Confirmed by  TEST, Record ([id number]) on 01/13/2015 7:16:10 AM      MDM   Final diagnoses:  Generalized weakness   89 male with generalized weakness after recent colonoscopy. Workup fairly unremarkable. Neurological exam is nonfocal. He was given 2 L IV fluids. He was ambulated around the emergency room without any complaints. He states that he feels better. Wife reports that he seems more alert. I feel is stable for discharge at this time.  I personally preformed the services scribed in my presence. The recorded information has been reviewed is accurate. Carlos Manifold, MD.      Carlos Manifold, MD 01/18/15 1007

## 2015-01-13 ENCOUNTER — Observation Stay (HOSPITAL_COMMUNITY)
Admission: EM | Admit: 2015-01-13 | Discharge: 2015-01-13 | Disposition: A | Discharge status: Home / Self Care | Payer: Medicare Other | Attending: Pulmonary Disease | Admitting: Pulmonary Disease

## 2015-01-13 ENCOUNTER — Emergency Department (HOSPITAL_COMMUNITY): Payer: Medicare Other

## 2015-01-13 ENCOUNTER — Encounter (HOSPITAL_COMMUNITY): Payer: Self-pay

## 2015-01-13 DIAGNOSIS — Y929 Unspecified place or not applicable: Secondary | ICD-10-CM | POA: Diagnosis not present

## 2015-01-13 DIAGNOSIS — Z794 Long term (current) use of insulin: Secondary | ICD-10-CM | POA: Diagnosis not present

## 2015-01-13 DIAGNOSIS — R5381 Other malaise: Secondary | ICD-10-CM

## 2015-01-13 DIAGNOSIS — Z85828 Personal history of other malignant neoplasm of skin: Secondary | ICD-10-CM | POA: Insufficient documentation

## 2015-01-13 DIAGNOSIS — Z9181 History of falling: Secondary | ICD-10-CM | POA: Diagnosis present

## 2015-01-13 DIAGNOSIS — W1830XA Fall on same level, unspecified, initial encounter: Secondary | ICD-10-CM | POA: Diagnosis not present

## 2015-01-13 DIAGNOSIS — R296 Repeated falls: Secondary | ICD-10-CM

## 2015-01-13 DIAGNOSIS — R51 Headache: Secondary | ICD-10-CM | POA: Diagnosis not present

## 2015-01-13 DIAGNOSIS — I10 Essential (primary) hypertension: Secondary | ICD-10-CM | POA: Diagnosis not present

## 2015-01-13 DIAGNOSIS — S0081XA Abrasion of other part of head, initial encounter: Secondary | ICD-10-CM | POA: Diagnosis not present

## 2015-01-13 DIAGNOSIS — E785 Hyperlipidemia, unspecified: Secondary | ICD-10-CM

## 2015-01-13 DIAGNOSIS — Z79899 Other long term (current) drug therapy: Secondary | ICD-10-CM | POA: Insufficient documentation

## 2015-01-13 DIAGNOSIS — R531 Weakness: Secondary | ICD-10-CM

## 2015-01-13 DIAGNOSIS — E119 Type 2 diabetes mellitus without complications: Secondary | ICD-10-CM | POA: Diagnosis not present

## 2015-01-13 DIAGNOSIS — K219 Gastro-esophageal reflux disease without esophagitis: Secondary | ICD-10-CM | POA: Insufficient documentation

## 2015-01-13 DIAGNOSIS — Z85038 Personal history of other malignant neoplasm of large intestine: Secondary | ICD-10-CM | POA: Insufficient documentation

## 2015-01-13 DIAGNOSIS — W19XXXA Unspecified fall, initial encounter: Secondary | ICD-10-CM

## 2015-01-13 HISTORY — DX: Malignant neoplasm of colon, unspecified: C18.9

## 2015-01-13 LAB — BASIC METABOLIC PANEL
ANION GAP: 9 (ref 5–15)
BUN: 16 mg/dL (ref 6–23)
CO2: 28 mmol/L (ref 19–32)
Calcium: 9.2 mg/dL (ref 8.4–10.5)
Chloride: 102 mmol/L (ref 96–112)
Creatinine, Ser: 1.24 mg/dL (ref 0.50–1.35)
GFR, EST AFRICAN AMERICAN: 64 mL/min — AB (ref >90)
GFR, EST NON AFRICAN AMERICAN: 56 mL/min — AB (ref >90)
Glucose, Bld: 228 mg/dL — ABNORMAL HIGH (ref 70–99)
POTASSIUM: 3.7 mmol/L (ref 3.5–5.1)
Sodium: 139 mmol/L (ref 135–145)

## 2015-01-13 LAB — CBC WITH DIFFERENTIAL/PLATELET
BASOS ABS: 0 10*3/uL (ref 0.0–0.1)
BASOS PCT: 0 % (ref 0–1)
EOS ABS: 0.2 10*3/uL (ref 0.0–0.7)
Eosinophils Relative: 3 % (ref 0–5)
HEMATOCRIT: 32.5 % — AB (ref 39.0–52.0)
Hemoglobin: 10.5 g/dL — ABNORMAL LOW (ref 13.0–17.0)
Lymphocytes Relative: 9 % — ABNORMAL LOW (ref 12–46)
Lymphs Abs: 0.5 10*3/uL — ABNORMAL LOW (ref 0.7–4.0)
MCH: 28.8 pg (ref 26.0–34.0)
MCHC: 32.3 g/dL (ref 30.0–36.0)
MCV: 89 fL (ref 78.0–100.0)
Monocytes Absolute: 0.4 10*3/uL (ref 0.1–1.0)
Monocytes Relative: 8 % (ref 3–12)
NEUTROS ABS: 4.1 10*3/uL (ref 1.7–7.7)
NEUTROS PCT: 80 % — AB (ref 43–77)
PLATELETS: 131 10*3/uL — AB (ref 150–400)
RBC: 3.65 MIL/uL — ABNORMAL LOW (ref 4.22–5.81)
RDW: 14.2 % (ref 11.5–15.5)
WBC: 5.2 10*3/uL (ref 4.0–10.5)

## 2015-01-13 LAB — TROPONIN I

## 2015-01-13 MED ORDER — SODIUM CHLORIDE 0.9 % IV BOLUS (SEPSIS)
1000.0000 mL | Freq: Once | INTRAVENOUS | Status: AC
  Administered 2015-01-13: 1000 mL via INTRAVENOUS

## 2015-01-13 MED ORDER — TETANUS-DIPHTH-ACELL PERTUSSIS 5-2.5-18.5 LF-MCG/0.5 IM SUSP
0.5000 mL | Freq: Once | INTRAMUSCULAR | Status: AC
  Administered 2015-01-13: 0.5 mL via INTRAMUSCULAR
  Filled 2015-01-13: qty 0.5

## 2015-01-13 NOTE — Discharge Instructions (Signed)
Recheck with your primary care physician tomorrow as scheduled.

## 2015-01-13 NOTE — ED Provider Notes (Addendum)
CSN: [id number]     Arrival date & time 01/13/15  1129 History  This chart was scribed for Carlos Ferguson, MD by Einar Pheasant, ED Scribe. This patient was seen in room APA07/APA07 and the patient's care was started at 12:16 PM.    Chief Complaint  Patient presents with  . Fall   Patient is a 74 y.o. male presenting with fall. The history is provided by the patient (the pt complains of a fall).  Fall This is a new problem. The current episode started 1 to 2 hours ago. The problem occurs rarely. The problem has not changed since onset.Pertinent negatives include no chest pain, no abdominal pain, no headaches and no shortness of breath. Nothing aggravates the symptoms. Nothing relieves the symptoms. He has tried nothing for the symptoms.   HPI Comments: Carlos Wolf is a 74 y.o. male who presents to the Emergency Department complaining of a fall that occurred 1 hour prior to arrival. Pt states that he tripped stepping out of the kitchen and hit his forehead on the floor.There was not LOC He was seen 2 days ago of a fall. Wife has noted that when the pt tries to walk he stumble toward one side, and this has been going on for 1 month.  He denies any abdominal pain, nausea, emesis, fever, chills, congestion, weakness, neck pain, or numbness.   Past Medical History  Diagnosis Date  . Hypertension   . High cholesterol   . Diabetes mellitus   . Acid reflux   . Occult GI bleeding     per patient blood in rectum my only problem  . Skin cancer     to nose  . Diabetes mellitus 02/04/2013   Past Surgical History  Procedure Laterality Date  . Cardiac surgery    . Tonsillectomy    . Skin cancer excision      nose  . 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 Circumcision  10/24/2011    Procedure: CIRCUMCISION ADULT;  Surgeon: Marissa Nestle;  Location: [facility];  Service: Urology;  Laterality: N/A;  with repair of glans penis  . Cystoscopy  10/24/2011    Procedure: CYSTOSCOPY  FLEXIBLE;  Surgeon: Marissa Nestle;  Location: [facility];  Service: Urology;;  . Esophagogastroduodenoscopy N/A 12/13/2012    Procedure: ESOPHAGOGASTRODUODENOSCOPY (EGD);  Surgeon: Rogene Houston, MD;  [facility];  Service: Endoscopy;  Laterality: N/A;  . Flexible sigmoidoscopy N/A 12/13/2012    Procedure: FLEXIBLE SIGMOIDOSCOPY;  Surgeon: Rogene Houston, MD;  [facility];  Service: Endoscopy;  Laterality: N/A;  . Colonoscopy N/A 12/27/2012    Procedure: COLONOSCOPY;  Surgeon: Rogene Houston, MD;  [facility];  Service: Endoscopy;  Laterality: N/A;  730-rescheduled to 200 Ann to notify pt  . Eus N/A 01/16/2013    Procedure: LOWER ENDOSCOPIC ULTRASOUND (EUS) radial only, 60 minutes, staging of rectal cancer, colonoscopy to follow;  Surgeon: Milus Banister, MD;  Location: [facility];  Service: Endoscopy;  Laterality: N/A;  . Colonoscopy N/A 01/16/2013    Procedure: COLONOSCOPY;  Surgeon: Milus Banister, MD;  Location: [facility];  Service: Endoscopy;  Laterality: N/A;  . Cataract extraction Right    Family History  Problem Relation Age of Onset  . Stroke Mother   . Heart failure Father   . Heart failure Brother   . Anesthesia problems Neg Hx   . Hypotension Neg Hx   .  Malignant hyperthermia Neg Hx   . Pseudochol deficiency Neg Hx   . Colon cancer Neg Hx   . Colon polyps Neg Hx    History  Substance Use Topics  . Smoking status: Never Smoker   . Smokeless tobacco: Never Used  . Alcohol Use: No    Review of Systems  Constitutional: Negative for appetite change and fatigue.  HENT: Negative for congestion, ear discharge and sinus pressure.   Eyes: Negative for discharge.  Respiratory: Negative for cough and shortness of breath.   Cardiovascular: Negative for chest pain.  Gastrointestinal: Negative for abdominal pain and diarrhea.  Genitourinary: Negative for frequency and hematuria.  Musculoskeletal: Negative for back pain.  Skin: Positive for  wound. Negative for rash.  Neurological: Negative for seizures and headaches.  Psychiatric/Behavioral: Negative for hallucinations.      Allergies  Docusate sodium; Neosporin; Percocet; and Adhesive  Home Medications   Prior to Admission medications   Medication Sig Start Date End Date Taking? Authorizing Provider  aspirin EC 81 MG tablet Take 81 mg by mouth every evening.   Yes Historical Provider, MD  insulin detemir (LEVEMIR) 100 UNIT/ML injection Inject 25-30 Units into the skin every morning. Injects 25-30 units based on sugar levels   Yes Historical Provider, MD  metoprolol succinate (TOPROL-XL) 25 MG 24 hr tablet Take 12.5 mg by mouth every evening.   Yes Historical Provider, MD  omeprazole (PRILOSEC) 20 MG capsule Take 20 mg by mouth at bedtime.    Yes Historical Provider, MD  pioglitazone-metformin (ACTOPLUS MET) 15-850 MG per tablet Take 1 tablet by mouth 2 (two) times daily.   Yes Historical Provider, MD  simethicone (MYLICON) 80 MG chewable tablet Chew 80 mg by mouth every 6 (six) hours as needed for flatulence.   Yes Historical Provider, MD  simvastatin (ZOCOR) 40 MG tablet Take 40 mg by mouth at bedtime.    Yes Historical Provider, MD   BP 163/43 mmHg  Pulse 111  Temp(Src) 98.7 F (37.1 C) (Oral)  Resp 18  SpO2 100%  Physical Exam  Constitutional: He is oriented to person, place, and time. He appears well-developed.  HENT:  Head: Normocephalic.  Eyes: Conjunctivae and EOM are normal. No scleral icterus.  Neck: Neck supple. No thyromegaly present.  Cardiovascular: Normal rate and regular rhythm.  Exam reveals no gallop and no friction rub.   No murmur heard. Pulmonary/Chest: No stridor. He has no wheezes. He has no rales. He exhibits no tenderness.  Abdominal: He exhibits no distension. There is no tenderness. There is no rebound.  Musculoskeletal: Normal range of motion. He exhibits no edema.  Lymphadenopathy:    He has no cervical adenopathy.  Neurological: He  is oriented to person, place, and time. He exhibits normal muscle tone. Coordination normal.  Skin: No rash noted. No erythema.  Abrasion noted to his forehead.   Psychiatric: He has a normal mood and affect. His behavior is normal.    ED Course  Procedures (including critical care time)  DIAGNOSTIC STUDIES: Oxygen Saturation is 100% on RA, normal by my interpretation.    COORDINATION OF CARE: 12:21 PM- Pt advised of plan for treatment and pt agrees.  Labs Review Labs Reviewed - No data to display  Imaging Review No results found.   EKG Interpretation   Date/Time:  Wednesday January 13 2015 13:09:03 EST Ventricular Rate:  82 PR Interval:  190 QRS Duration: 85 QT Interval:  384 QTC Calculation: 448 R Axis:   62 Text Interpretation:  Sinus rhythm Ventricular trigeminy Abnormal R-wave  progression, early transition Borderline T abnormalities, inferior leads  Confirmed by Tristen Pennino  MD, Terrisa Curfman [phone]) on 01/13/2015 3:15:16 PM      MDM   Final diagnoses:  None   Admit for falls  The chart was scribed for me under my direct supervision.  I personally performed the history, physical, and medical decision making and all procedures in the evaluation of this patient.Carlos Ferguson, MD 01/13/15 1537  Carlos Ferguson, MD 01/25/15 1346

## 2015-01-13 NOTE — ED Notes (Signed)
Pt went to step outside kitchen door and missed step and fell. Abrasion noted to forehead. Denies any loc.

## 2015-01-13 NOTE — H&P (Signed)
[facility] History and Physical  DEVARIOUS PAVEK JJH:[id number] DOB: 04/13/1941 DOA: 01/13/2015  Referring physician: Dr Roderic Palau - [facility] PCP: Delphina Cahill, MD   Chief Complaint: Falls  HPI: Carlos Wolf is a 74 y.o. male  Multiple falls over the past 4 days. First fall occurred at 03:00 when pt got up to go to the bathroom. Pt next remembers being picked up off the bathroom floor. Episode today occurred around 10:30. Pt reports falling today and when going out back door and missing step onto the porch. Pt reports almost catching self on door but did not have the strength to hold keep from falling.   Pt w/ progressive weakness over past several months.   Family states that since colon cancer surgery in march 2015 pt has not been his normal self.     Review of Systems:  Constitutional:  No weight loss, night sweats, Fevers, chills.  HEENT:  No headaches, Difficulty swallowing,Tooth/dental problems,Sore throat,  No sneezing, itching, ear ache, nasal congestion, post nasal drip,  Cardio-vascular:  No chest pain, Orthopnea, PND, swelling in lower extremities, anasarca, dizziness, palpitations  GI:  No heartburn, indigestion, abdominal pain, nausea, vomiting, diarrhea, change in bowel habits, loss of appetite  Resp:   No shortness of breath with exertion or at rest. No excess mucus, no productive cough, No non-productive cough, No coughing up of blood.No change in color of mucus.No wheezing.No chest wall deformity  Skin: Abrasions from falls GU:  no dysuria, change in color of urine, no urgency or frequency. No flank pain.  Musculoskeletal:   No joint pain or swelling. No decreased range of motion. No back pain.  Psych:  No change in mood or affect. No depression or anxiety. No memory loss.   Past Medical History  Diagnosis Date  . Hypertension   . High cholesterol   . Diabetes mellitus   . Acid reflux   . Occult GI bleeding     per patient blood in rectum my only problem    . Skin cancer     to nose  . Diabetes mellitus 02/04/2013  . Colon cancer    Past Surgical History  Procedure Laterality Date  . Cardiac surgery    . Tonsillectomy    . Skin cancer excision      nose  . 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 Circumcision  10/24/2011    Procedure: CIRCUMCISION ADULT;  Surgeon: Marissa Nestle;  Location: [facility];  Service: Urology;  Laterality: N/A;  with repair of glans penis  . Cystoscopy  10/24/2011    Procedure: CYSTOSCOPY FLEXIBLE;  Surgeon: Marissa Nestle;  Location: [facility];  Service: Urology;;  . Esophagogastroduodenoscopy N/A 12/13/2012    Procedure: ESOPHAGOGASTRODUODENOSCOPY (EGD);  Surgeon: Rogene Houston, MD;  Location: [facility];  Service: Endoscopy;  Laterality: N/A;  . Flexible sigmoidoscopy N/A 12/13/2012    Procedure: FLEXIBLE SIGMOIDOSCOPY;  Surgeon: Rogene Houston, MD;  Location: [facility];  Service: Endoscopy;  Laterality: N/A;  . Colonoscopy N/A 12/27/2012    Procedure: COLONOSCOPY;  Surgeon: Rogene Houston, MD;  Location: [facility];  Service: Endoscopy;  Laterality: N/A;  730-rescheduled to 200 Ann to notify pt  . Eus N/A 01/16/2013    Procedure: LOWER ENDOSCOPIC ULTRASOUND (EUS) radial only, 60 minutes, staging of rectal cancer, colonoscopy to follow;  Surgeon: Milus Banister, MD;  Location: [facility];  Service: Endoscopy;  Laterality: N/A;  .  Colonoscopy N/A 01/16/2013    Procedure: COLONOSCOPY;  Surgeon: Milus Banister, MD;  Location: [facility];  Service: Endoscopy;  Laterality: N/A;  . Cataract extraction Right   . Hemicolectomy     Social History:  reports that he has never smoked. He has never used smokeless tobacco. He reports that he does not drink alcohol or use illicit drugs.  Allergies  Allergen Reactions  . Docusate Sodium Hives  . Neosporin [Neomycin-Bacitracin Zn-Polymyx] Itching  . Percocet [Oxycodone-Acetaminophen] Nausea And Vomiting  . Adhesive [Tape] Itching and Rash     Family History  Problem Relation Age of Onset  . Stroke Mother   . Heart failure Father   . Heart failure Brother   . Anesthesia problems Neg Hx   . Hypotension Neg Hx   . Malignant hyperthermia Neg Hx   . Pseudochol deficiency Neg Hx   . Colon cancer Neg Hx   . Colon polyps Neg Hx      Prior to Admission medications   Medication Sig Start Date End Date Taking? Authorizing Provider  aspirin EC 81 MG tablet Take 81 mg by mouth every evening.   Yes Historical Provider, MD  insulin detemir (LEVEMIR) 100 UNIT/ML injection Inject 25-30 Units into the skin every morning. Injects 25-30 units based on sugar levels   Yes Historical Provider, MD  metoprolol succinate (TOPROL-XL) 25 MG 24 hr tablet Take 12.5 mg by mouth every evening.   Yes Historical Provider, MD  omeprazole (PRILOSEC) 20 MG capsule Take 20 mg by mouth at bedtime.    Yes Historical Provider, MD  pioglitazone-metformin (ACTOPLUS MET) 15-850 MG per tablet Take 1 tablet by mouth 2 (two) times daily.   Yes Historical Provider, MD  simethicone (MYLICON) 80 MG chewable tablet Chew 80 mg by mouth every 6 (six) hours as needed for flatulence.   Yes Historical Provider, MD  simvastatin (ZOCOR) 40 MG tablet Take 40 mg by mouth at bedtime.    Yes Historical Provider, MD   Physical Exam: Filed Vitals:   01/13/15 1330 01/13/15 1400 01/13/15 1430 01/13/15 1605  BP: 160/64 151/71 168/64 158/50  Pulse: 86 78 79 80  Temp:    97.9 F (36.6 C)  TempSrc:    Oral  Resp: _0 SpO2: 97% 100% 99% 100%    Wt Readings from Last 3 Encounters:  01/11/15 77.111 kg (170 lb)  02/10/13 86.655 kg (191 lb 0.6 oz)  01/24/13 85.775 kg (189 lb 1.6 oz)    General:  Appears calm and comfortable Eyes:  PERRL, normal lids, irises & conjunctiva ENT:  grossly normal hearing, lips & tongue Neck:  no LAD, masses or thyromegaly Cardiovascular:  RRR, no m/r/g. No LE edema. Telemetry:  SR, no arrhythmias  Respiratory:  CTA bilaterally, no w/r/r.  Normal respiratory effort. Abdomen:  soft, ntnd Skin: Numerous skin abrasions on head and arms Musculoskeletal:  grossly normal tone BUE/BLE Psychiatric:  grossly normal mood and affect, speech fluent and appropriate Neurologic: CN 2-12 intact. No dysmetria. Rhomberg negative.           Labs on Admission:  Basic Metabolic Panel:  Recent Labs Lab 01/11/15 1322 01/13/15 1300  NA 138 139  K 3.8 3.7  CL 100 102  CO2 29 28  GLUCOSE 299* 228*  BUN 16 16  CREATININE 1.36* 1.24  CALCIUM 9.5 9.2   Liver Function Tests: No results for input(s): AST, ALT, ALKPHOS, BILITOT, PROT, ALBUMIN in the last 168 hours. No results for  input(s): LIPASE, AMYLASE in the last 168 hours. No results for input(s): AMMONIA in the last 168 hours. CBC:  Recent Labs Lab 01/11/15 1322 01/13/15 1300  WBC 8.1 5.2  NEUTROABS  --  4.1  HGB 12.1* 10.5*  HCT 36.3* 32.5*  MCV 87.7 89.0  PLT 169 131*   Cardiac Enzymes:  Recent Labs Lab 01/13/15 1300  TROPONINI <0.03    BNP (last 3 results) No results for input(s): BNP in the last 8760 hours.  ProBNP (last 3 results) No results for input(s): PROBNP in the last 8760 hours.  CBG:  Recent Labs Lab 01/11/15 1045  GLUCAP 226*    Radiological Exams on Admission: Dg Lumbar Spine Complete  01/13/2015   CLINICAL DATA:  Fall most morning as well as 2 weeks ago with persistent back pain, initial encounter  EXAM: LUMBAR SPINE - COMPLETE 4+ VIEW  COMPARISON:  None.  FINDINGS: Vertebral body height is well maintained. Multilevel osteophytic changes are seen. No compression deformities are noted. No pars defects are seen. The overlying soft tissues demonstrate fecal material throughout the colon as well as postsurgical changes in the right mid abdomen.  IMPRESSION: Degenerative change without acute abnormality.   Electronically Signed   By: Inez Catalina M.D.   On: 01/13/2015 13:04   Ct Head Wo Contrast  01/13/2015   CLINICAL DATA:  Fall, forehead abrasion,  headache, initial encounter.  EXAM: CT HEAD WITHOUT CONTRAST  CT CERVICAL SPINE WITHOUT CONTRAST  TECHNIQUE: Multidetector CT imaging of the head and cervical spine was performed following the standard protocol without intravenous contrast. Multiplanar CT image reconstructions of the cervical spine were also generated.  COMPARISON:  None.  FINDINGS: CT HEAD FINDINGS  Likely remote lacunar infarct in the left thalamus (image 15). Otherwise, no evidence of an acute infarct, acute hemorrhage, mass lesion, mass effect or hydrocephalus. Atrophy. Confluent low-attenuation in the periventricular deep white matter. No air-fluid levels in the paranasal sinuses or mastoid air cells. No fracture.  CT CERVICAL SPINE FINDINGS  Alignment is anatomic. Vertebral body and disc space height are maintained. No fracture. Mild multilevel uncovertebral hypertrophy and facet sclerosis. Facet hypertrophy is worst at C2-3. Bridging anterior osteophytosis is most prominent at C4-5. At C5-6, moderate right neural foraminal narrowing. Visualized lung apices show no acute findings. Soft tissues are unremarkable.  IMPRESSION: 1. No evidence of acute trauma in the head or cervical spine. 2. Atrophy and chronic microvascular white matter ischemic changes. 3. 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 4. Multilevel degenerative disc disease.   Electronically Signed   By: Lorin Picket M.D.   On: 01/13/2015 13:07   Ct Cervical Spine Wo Contrast  01/13/2015   CLINICAL DATA:  Fall, forehead abrasion, headache, initial encounter.  EXAM: CT HEAD WITHOUT CONTRAST  CT CERVICAL SPINE WITHOUT CONTRAST  TECHNIQUE: Multidetector CT imaging of the head and cervical spine was performed following the standard protocol without intravenous contrast. Multiplanar CT image reconstructions of the cervical spine were also generated.  COMPARISON:  None.  FINDINGS: CT HEAD FINDINGS  Likely remote lacunar infarct in the left thalamus (image 15). Otherwise, no  evidence of an acute infarct, acute hemorrhage, mass lesion, mass effect or hydrocephalus. Atrophy. Confluent low-attenuation in the periventricular deep white matter. No air-fluid levels in the paranasal sinuses or mastoid air cells. No fracture.  CT CERVICAL SPINE FINDINGS  Alignment is anatomic. Vertebral body and disc space height are maintained. No fracture. Mild multilevel uncovertebral hypertrophy and facet sclerosis. Facet hypertrophy  is worst at C2-3. Bridging anterior osteophytosis is most prominent at C4-5. At C5-6, moderate right neural foraminal narrowing. Visualized lung apices show no acute findings. Soft tissues are unremarkable.  IMPRESSION: 1. No evidence of acute trauma in the head or cervical spine. 2. Atrophy and chronic microvascular white matter ischemic changes. 3. 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 4. Multilevel degenerative disc disease.   Electronically Signed   By: Lorin Picket M.D.   On: 01/13/2015 13:07    EKG: Independently reviewed.  Sinus rhythm Ventricular trigeminy No sign of ACS Unchanged from previous  Assessment/Plan Active Problems:   Weakness   Multiple falls   Physical deconditioning   Essential hypertension   GERD (gastroesophageal reflux disease)   HLD (hyperlipidemia)   Multiple falls: pt w/ multiple falls over the past several days. Outside of one vasovagal episode which occurred at 3:00 in the morning all other episodes seem to be mechanical in nature. Patient had trouble today did not result in any intracranial hemorrhaging or other process. Follows do not seem to be due to seizure activity, metabolic disorder, arrhythmia, or stroke. Discussed patient's condition thoroughly with multiple family members feel that he would be best served to begin a rigorous outpatient physical therapy regimen. Patient's case was discussed with his primary care physician Dr. Nevada Crane who agrees with this plan. Multiple tests were made to reach case  management but this was apparently done after he had left for the day. Patient is to call PCPs office Dr. Nevada Crane to set up appointment and/or physical therapy pool therapy appointment for tomorrow or Friday.There is no acute process the sensitivity condition at this time. Patient has an excellent home support system. Orthostatics were negative. Patient is to continue all other current medical therapies for his GERD, hyperlipidemia, hypertension, diabetes. Discussed discharge with family and they agree with this plan.   Family Communication: Daughter Disposition Plan: stable and ready for discharge home  MERRELL, DAVID Lenna Sciara, MD Family Medicine [facility] [email or web address] Password [email or web address]

## 2015-01-13 NOTE — ED Notes (Signed)
hospitalist here to evaluate

## 2015-05-11 ENCOUNTER — Other Ambulatory Visit (HOSPITAL_COMMUNITY): Payer: Self-pay | Admitting: Internal Medicine

## 2015-05-11 DIAGNOSIS — M6281 Muscle weakness (generalized): Secondary | ICD-10-CM

## 2015-05-11 DIAGNOSIS — R296 Repeated falls: Secondary | ICD-10-CM

## 2015-05-24 ENCOUNTER — Ambulatory Visit (HOSPITAL_COMMUNITY)
Admission: RE | Admit: 2015-05-24 | Discharge: 2015-05-24 | Disposition: A | Discharge status: Home / Self Care | Payer: Medicare Other | Source: Ambulatory Visit | Attending: Internal Medicine | Admitting: Internal Medicine

## 2015-05-24 DIAGNOSIS — R262 Difficulty in walking, not elsewhere classified: Secondary | ICD-10-CM | POA: Insufficient documentation

## 2015-05-24 DIAGNOSIS — G319 Degenerative disease of nervous system, unspecified: Secondary | ICD-10-CM | POA: Insufficient documentation

## 2015-05-24 DIAGNOSIS — R531 Weakness: Secondary | ICD-10-CM | POA: Diagnosis not present

## 2015-05-24 DIAGNOSIS — R296 Repeated falls: Secondary | ICD-10-CM

## 2015-05-24 DIAGNOSIS — M6281 Muscle weakness (generalized): Secondary | ICD-10-CM

## 2016-03-02 ENCOUNTER — Encounter (HOSPITAL_COMMUNITY): Payer: Self-pay | Admitting: Emergency Medicine

## 2016-03-02 ENCOUNTER — Emergency Department (HOSPITAL_COMMUNITY): Payer: Medicare Other

## 2016-03-02 ENCOUNTER — Inpatient Hospital Stay (HOSPITAL_COMMUNITY)
Admission: EM | Admit: 2016-03-02 | Discharge: 2016-03-09 | DRG: 280 | Disposition: A | Discharge status: Rehabilitation Facility | Payer: Medicare Other | Attending: Interventional Cardiology | Admitting: Interventional Cardiology

## 2016-03-02 DIAGNOSIS — I25110 Atherosclerotic heart disease of native coronary artery with unstable angina pectoris: Secondary | ICD-10-CM | POA: Diagnosis present

## 2016-03-02 DIAGNOSIS — K219 Gastro-esophageal reflux disease without esophagitis: Secondary | ICD-10-CM | POA: Diagnosis present

## 2016-03-02 DIAGNOSIS — R001 Bradycardia, unspecified: Secondary | ICD-10-CM | POA: Diagnosis present

## 2016-03-02 DIAGNOSIS — Z9049 Acquired absence of other specified parts of digestive tract: Secondary | ICD-10-CM | POA: Diagnosis not present

## 2016-03-02 DIAGNOSIS — Y838 Other surgical procedures as the cause of abnormal reaction of the patient, or of later complication, without mention of misadventure at the time of the procedure: Secondary | ICD-10-CM | POA: Diagnosis not present

## 2016-03-02 DIAGNOSIS — I97820 Postprocedural cerebrovascular infarction during cardiac surgery: Secondary | ICD-10-CM | POA: Diagnosis not present

## 2016-03-02 DIAGNOSIS — I69398 Other sequelae of cerebral infarction: Secondary | ICD-10-CM | POA: Diagnosis not present

## 2016-03-02 DIAGNOSIS — Z8249 Family history of ischemic heart disease and other diseases of the circulatory system: Secondary | ICD-10-CM | POA: Diagnosis not present

## 2016-03-02 DIAGNOSIS — I63439 Cerebral infarction due to embolism of unspecified posterior cerebral artery: Secondary | ICD-10-CM | POA: Diagnosis not present

## 2016-03-02 DIAGNOSIS — N183 Chronic kidney disease, stage 3 (moderate): Secondary | ICD-10-CM | POA: Diagnosis present

## 2016-03-02 DIAGNOSIS — I442 Atrioventricular block, complete: Secondary | ICD-10-CM | POA: Diagnosis not present

## 2016-03-02 DIAGNOSIS — I10 Essential (primary) hypertension: Secondary | ICD-10-CM | POA: Diagnosis present

## 2016-03-02 DIAGNOSIS — R41842 Visuospatial deficit: Secondary | ICD-10-CM | POA: Diagnosis not present

## 2016-03-02 DIAGNOSIS — I25111 Atherosclerotic heart disease of native coronary artery with angina pectoris with documented spasm: Secondary | ICD-10-CM | POA: Diagnosis not present

## 2016-03-02 DIAGNOSIS — I441 Atrioventricular block, second degree: Secondary | ICD-10-CM | POA: Diagnosis not present

## 2016-03-02 DIAGNOSIS — I639 Cerebral infarction, unspecified: Secondary | ICD-10-CM | POA: Insufficient documentation

## 2016-03-02 DIAGNOSIS — I214 Non-ST elevation (NSTEMI) myocardial infarction: Secondary | ICD-10-CM | POA: Diagnosis present

## 2016-03-02 DIAGNOSIS — Z6825 Body mass index (BMI) 25.0-25.9, adult: Secondary | ICD-10-CM | POA: Diagnosis not present

## 2016-03-02 DIAGNOSIS — E669 Obesity, unspecified: Secondary | ICD-10-CM | POA: Diagnosis present

## 2016-03-02 DIAGNOSIS — I129 Hypertensive chronic kidney disease with stage 1 through stage 4 chronic kidney disease, or unspecified chronic kidney disease: Secondary | ICD-10-CM | POA: Diagnosis present

## 2016-03-02 DIAGNOSIS — Y832 Surgical operation with anastomosis, bypass or graft as the cause of abnormal reaction of the patient, or of later complication, without mention of misadventure at the time of the procedure: Secondary | ICD-10-CM | POA: Diagnosis present

## 2016-03-02 DIAGNOSIS — Z7982 Long term (current) use of aspirin: Secondary | ICD-10-CM

## 2016-03-02 DIAGNOSIS — I63533 Cerebral infarction due to unspecified occlusion or stenosis of bilateral posterior cerebral arteries: Secondary | ICD-10-CM | POA: Diagnosis not present

## 2016-03-02 DIAGNOSIS — Z85038 Personal history of other malignant neoplasm of large intestine: Secondary | ICD-10-CM

## 2016-03-02 DIAGNOSIS — E1122 Type 2 diabetes mellitus with diabetic chronic kidney disease: Secondary | ICD-10-CM | POA: Diagnosis present

## 2016-03-02 DIAGNOSIS — E7800 Pure hypercholesterolemia, unspecified: Secondary | ICD-10-CM | POA: Diagnosis present

## 2016-03-02 DIAGNOSIS — Y92239 Unspecified place in hospital as the place of occurrence of the external cause: Secondary | ICD-10-CM | POA: Diagnosis not present

## 2016-03-02 DIAGNOSIS — Z85828 Personal history of other malignant neoplasm of skin: Secondary | ICD-10-CM

## 2016-03-02 DIAGNOSIS — Z91048 Other nonmedicinal substance allergy status: Secondary | ICD-10-CM | POA: Diagnosis not present

## 2016-03-02 DIAGNOSIS — R410 Disorientation, unspecified: Secondary | ICD-10-CM | POA: Diagnosis not present

## 2016-03-02 DIAGNOSIS — R451 Restlessness and agitation: Secondary | ICD-10-CM | POA: Diagnosis not present

## 2016-03-02 DIAGNOSIS — Z923 Personal history of irradiation: Secondary | ICD-10-CM | POA: Diagnosis not present

## 2016-03-02 DIAGNOSIS — I63431 Cerebral infarction due to embolism of right posterior cerebral artery: Secondary | ICD-10-CM | POA: Diagnosis not present

## 2016-03-02 DIAGNOSIS — Z7984 Long term (current) use of oral hypoglycemic drugs: Secondary | ICD-10-CM | POA: Diagnosis not present

## 2016-03-02 DIAGNOSIS — H539 Unspecified visual disturbance: Secondary | ICD-10-CM | POA: Diagnosis not present

## 2016-03-02 DIAGNOSIS — E119 Type 2 diabetes mellitus without complications: Secondary | ICD-10-CM | POA: Diagnosis not present

## 2016-03-02 DIAGNOSIS — I6340 Cerebral infarction due to embolism of unspecified cerebral artery: Secondary | ICD-10-CM | POA: Diagnosis not present

## 2016-03-02 DIAGNOSIS — I447 Left bundle-branch block, unspecified: Secondary | ICD-10-CM | POA: Diagnosis not present

## 2016-03-02 DIAGNOSIS — R079 Chest pain, unspecified: Secondary | ICD-10-CM | POA: Diagnosis not present

## 2016-03-02 DIAGNOSIS — R000 Tachycardia, unspecified: Secondary | ICD-10-CM | POA: Diagnosis not present

## 2016-03-02 DIAGNOSIS — R4182 Altered mental status, unspecified: Secondary | ICD-10-CM | POA: Diagnosis not present

## 2016-03-02 DIAGNOSIS — Z883 Allergy status to other anti-infective agents status: Secondary | ICD-10-CM

## 2016-03-02 DIAGNOSIS — H547 Unspecified visual loss: Secondary | ICD-10-CM

## 2016-03-02 DIAGNOSIS — I25710 Atherosclerosis of autologous vein coronary artery bypass graft(s) with unstable angina pectoris: Secondary | ICD-10-CM | POA: Diagnosis present

## 2016-03-02 DIAGNOSIS — I63519 Cerebral infarction due to unspecified occlusion or stenosis of unspecified middle cerebral artery: Secondary | ICD-10-CM | POA: Diagnosis not present

## 2016-03-02 DIAGNOSIS — Z823 Family history of stroke: Secondary | ICD-10-CM | POA: Diagnosis not present

## 2016-03-02 DIAGNOSIS — Y710 Diagnostic and monitoring cardiovascular devices associated with adverse incidents: Secondary | ICD-10-CM | POA: Diagnosis not present

## 2016-03-02 DIAGNOSIS — Z8673 Personal history of transient ischemic attack (TIA), and cerebral infarction without residual deficits: Secondary | ICD-10-CM

## 2016-03-02 DIAGNOSIS — I200 Unstable angina: Secondary | ICD-10-CM | POA: Diagnosis present

## 2016-03-02 DIAGNOSIS — R404 Transient alteration of awareness: Secondary | ICD-10-CM | POA: Diagnosis not present

## 2016-03-02 DIAGNOSIS — Z885 Allergy status to narcotic agent status: Secondary | ICD-10-CM | POA: Diagnosis not present

## 2016-03-02 DIAGNOSIS — E1159 Type 2 diabetes mellitus with other circulatory complications: Secondary | ICD-10-CM | POA: Diagnosis not present

## 2016-03-02 DIAGNOSIS — Z888 Allergy status to other drugs, medicaments and biological substances status: Secondary | ICD-10-CM | POA: Diagnosis not present

## 2016-03-02 DIAGNOSIS — R4781 Slurred speech: Secondary | ICD-10-CM | POA: Diagnosis not present

## 2016-03-02 DIAGNOSIS — E785 Hyperlipidemia, unspecified: Secondary | ICD-10-CM | POA: Diagnosis present

## 2016-03-02 DIAGNOSIS — I69319 Unspecified symptoms and signs involving cognitive functions following cerebral infarction: Secondary | ICD-10-CM | POA: Diagnosis not present

## 2016-03-02 DIAGNOSIS — H53462 Homonymous bilateral field defects, left side: Secondary | ICD-10-CM | POA: Diagnosis present

## 2016-03-02 DIAGNOSIS — I63039 Cerebral infarction due to thrombosis of unspecified carotid artery: Secondary | ICD-10-CM | POA: Diagnosis not present

## 2016-03-02 DIAGNOSIS — R269 Unspecified abnormalities of gait and mobility: Secondary | ICD-10-CM | POA: Diagnosis not present

## 2016-03-02 LAB — COMPREHENSIVE METABOLIC PANEL
ALBUMIN: 3 g/dL — AB (ref 3.5–5.0)
ALT: 19 U/L (ref 17–63)
AST: 38 U/L (ref 15–41)
Alkaline Phosphatase: 68 U/L (ref 38–126)
Anion gap: 16 — ABNORMAL HIGH (ref 5–15)
BUN: 23 mg/dL — ABNORMAL HIGH (ref 6–20)
CHLORIDE: 102 mmol/L (ref 101–111)
CO2: 20 mmol/L — AB (ref 22–32)
Calcium: 9.1 mg/dL (ref 8.9–10.3)
Creatinine, Ser: 1.68 mg/dL — ABNORMAL HIGH (ref 0.61–1.24)
GFR calc non Af Amer: 38 mL/min — ABNORMAL LOW (ref >60)
GFR, EST AFRICAN AMERICAN: 44 mL/min — AB (ref >60)
Glucose, Bld: 173 mg/dL — ABNORMAL HIGH (ref 65–99)
Potassium: 4.1 mmol/L (ref 3.5–5.1)
SODIUM: 138 mmol/L (ref 135–145)
Total Bilirubin: 0.8 mg/dL (ref 0.3–1.2)
Total Protein: 5.8 g/dL — ABNORMAL LOW (ref 6.5–8.1)

## 2016-03-02 LAB — CBC WITH DIFFERENTIAL/PLATELET
BASOS PCT: 0 %
Basophils Absolute: 0 10*3/uL (ref 0.0–0.1)
EOS ABS: 0.1 10*3/uL (ref 0.0–0.7)
EOS PCT: 1 %
HCT: 36.8 % — ABNORMAL LOW (ref 39.0–52.0)
Hemoglobin: 12.5 g/dL — ABNORMAL LOW (ref 13.0–17.0)
LYMPHS ABS: 0.9 10*3/uL (ref 0.7–4.0)
Lymphocytes Relative: 10 %
MCH: 28.2 pg (ref 26.0–34.0)
MCHC: 34 g/dL (ref 30.0–36.0)
MCV: 82.9 fL (ref 78.0–100.0)
Monocytes Absolute: 0.7 10*3/uL (ref 0.1–1.0)
Monocytes Relative: 8 %
Neutro Abs: 7.6 10*3/uL (ref 1.7–7.7)
Neutrophils Relative %: 81 %
PLATELETS: 190 10*3/uL (ref 150–400)
RBC: 4.44 MIL/uL (ref 4.22–5.81)
RDW: 15.8 % — ABNORMAL HIGH (ref 11.5–15.5)
WBC: 9.3 10*3/uL (ref 4.0–10.5)

## 2016-03-02 LAB — TROPONIN I: Troponin I: 1.22 ng/mL (ref <0.031)

## 2016-03-02 MED ORDER — HEPARIN BOLUS VIA INFUSION
4000.0000 [IU] | Freq: Once | INTRAVENOUS | Status: AC
  Administered 2016-03-02: 4000 [IU] via INTRAVENOUS
  Filled 2016-03-02: qty 4000

## 2016-03-02 MED ORDER — ASPIRIN 81 MG PO CHEW: 81.0000 mg | CHEWABLE_TABLET | Freq: Once | ORAL | Status: DC

## 2016-03-02 MED ORDER — HEPARIN (PORCINE) IN NACL 100-0.45 UNIT/ML-% IJ SOLN
950.0000 [IU]/h | INTRAMUSCULAR | Status: DC
  Administered 2016-03-02: 950 [IU]/h via INTRAVENOUS
  Filled 2016-03-02: qty 250

## 2016-03-02 NOTE — ED Notes (Signed)
Pt just reveiled that he has has a bipass in 2002.

## 2016-03-02 NOTE — Progress Notes (Signed)
  ANTICOAGULATION CONSULT NOTE - Initial Consult  Pharmacy Consult for Heparin Indication: chest pain/ACS  Allergies  Allergen Reactions  . Docusate Sodium Hives  . Neosporin [Neomycin-Bacitracin Zn-Polymyx] Itching  . Percocet [Oxycodone-Acetaminophen] Nausea And Vomiting  . Adhesive [Tape] Itching, Rash and Other (See Comments)    Also pulls skin if left on too long -Please use paper tape    Patient Measurements:   Heparin Dosing Weight: 77.1 kg  Vital Signs: BP: 140/87 mmHg (04/27 2153) Pulse Rate: 115 (04/27 2153)  Labs: No results for input(s): HGB, HCT, PLT, APTT, LABPROT, INR, HEPARINUNFRC, HEPRLOWMOCWT, CREATININE, CKTOTAL, CKMB, TROPONINI in the last 72 hours.  CrCl cannot be calculated (Unknown ideal weight.).   Medical History: Past Medical History  Diagnosis Date  . Hypertension   . High cholesterol   . Diabetes mellitus   . Acid reflux   . Occult GI bleeding     per patient blood in rectum my only problem  . Skin cancer     to nose  . Diabetes mellitus ([place]) 02/04/2013  . Colon cancer [facility])     Assessment: 74yo-Male presents with chest pressure radiating from arm pit to arm pit.  PMH includes CABG from 2002, HTN, HLD, T2DM.  Pharmacy consulted to start heparin for ACS/Chest Pain.    On Admit: Hgb 12.5, PLT 190 No PTA anticoagulation  Goal of Therapy:  Heparin level 0.3-0.7 units/ml Monitor platelets by anticoagulation protocol: Yes   Plan:  Give 4000 units bolus x 1 Start heparin infusion at 950 units/hr Check anti-Xa level in 8 hours and daily while on heparin Continue to monitor H&H and platelets  Viann Fish 03/02/2016,10:30 PM

## 2016-03-02 NOTE — ED Provider Notes (Signed)
CSN: [id number]     Arrival date & time 03/02/16  2138 History   First MD Initiated Contact with Patient 03/02/16 2140     Chief Complaint  Patient presents with  . Chest Pain     (Consider location/radiation/quality/duration/timing/severity/associated sxs/prior Treatment) HPI Patient presents with episodic left-sided chest pain radiating to the right chest over the last 2 days. States it comes on with any exertion. Describes it as pressure. It is relieved with rest. Patient states he began having the pain this evening around 6:00. Became diaphoretic. [facility] was called. Given aspirin and nitroglycerin en route with complete resolution of his symptoms. He now denies any chest pain or shortness of breath. Past Medical History  Diagnosis Date  . Hypertension   . High cholesterol   . Diabetes mellitus   . Acid reflux   . Occult GI bleeding     per patient blood in rectum my only problem  . Skin cancer     to nose  . Diabetes mellitus ([place]) 02/04/2013  . Colon cancer [facility])    Past Surgical History  Procedure Laterality Date  . Cardiac surgery    . Tonsillectomy    . Skin cancer excision      nose  . 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 Circumcision  10/24/2011    Procedure: CIRCUMCISION ADULT;  Surgeon: Marissa Nestle;  Location: [facility];  Service: Urology;  Laterality: N/A;  with repair of glans penis  . Cystoscopy  10/24/2011    Procedure: CYSTOSCOPY FLEXIBLE;  Surgeon: Marissa Nestle;  Location: [facility];  Service: Urology;;  . Esophagogastroduodenoscopy N/A 12/13/2012    Procedure: ESOPHAGOGASTRODUODENOSCOPY (EGD);  Surgeon: Rogene Houston, MD;  [facility];  Service: Endoscopy;  Laterality: N/A;  . Flexible sigmoidoscopy N/A 12/13/2012    Procedure: FLEXIBLE SIGMOIDOSCOPY;  Surgeon: Rogene Houston, MD;  [facility];  Service: Endoscopy;  Laterality: N/A;  . Colonoscopy N/A 12/27/2012    Procedure: COLONOSCOPY;  Surgeon: Rogene Houston,  MD;  [facility];  Service: Endoscopy;  Laterality: N/A;  730-rescheduled to 200 Ann to notify pt  . Eus N/A 01/16/2013    Procedure: LOWER ENDOSCOPIC ULTRASOUND (EUS) radial only, 60 minutes, staging of rectal cancer, colonoscopy to follow;  Surgeon: Milus Banister, MD;  [facility];  Service: Endoscopy;  Laterality: N/A;  . Colonoscopy N/A 01/16/2013    Procedure: COLONOSCOPY;  Surgeon: Milus Banister, MD;  [facility];  Service: Endoscopy;  Laterality: N/A;  . Cataract extraction Right   . Hemicolectomy    . Cardiac catheterization N/A 03/03/2016    Procedure: Left Heart Cath and Cors/Grafts Angiography;  Surgeon: Troy Sine, MD;  Location: [place] [facility];  Service: Cardiovascular;  Laterality: N/A;   Family History  Problem Relation Age of Onset  . Stroke Mother   . Heart failure Father   . Heart failure Brother   . Anesthesia problems Neg Hx   . Hypotension Neg Hx   . Malignant hyperthermia Neg Hx   . Pseudochol deficiency Neg Hx   . Colon cancer Neg Hx   . Colon polyps Neg Hx    Social History  Substance Use Topics  . Smoking status: Never Smoker   . Smokeless tobacco: Never Used  . Alcohol Use: No    Review of Systems  Constitutional: Positive for diaphoresis. Negative for fever and chills.  Respiratory: Negative for  cough and shortness of breath.   Cardiovascular: Positive for chest pain. Negative for palpitations and leg swelling.  Gastrointestinal: Negative for nausea, vomiting, abdominal pain and diarrhea.  Musculoskeletal: Negative for back pain, neck pain and neck stiffness.  Skin: Negative for rash and wound.  Neurological: Negative for dizziness, weakness, light-headedness, numbness and headaches.  All other systems reviewed and are negative.     Allergies  Docusate sodium; Neosporin; Percocet; and Adhesive  Home Medications   Prior to Admission medications   Medication Sig Start Date End Date Taking? Authorizing  Provider  aspirin EC 81 MG tablet Take 81 mg by mouth at bedtime.    Yes Historical Provider, MD  Cholecalciferol (VITAMIN D3) 5000 units TABS Take 5,000 Units by mouth daily.   Yes Historical Provider, MD  Insulin Degludec (TRESIBA FLEXTOUCH) 200 UNIT/ML SOPN Inject 25-35 Units into the skin at bedtime. Inject 25 units if CBG <200, 35 units if CBG >200   Yes Historical Provider, MD  metFORMIN (GLUCOPHAGE) 850 MG tablet Take 850 mg by mouth 2 (two) times daily with a meal.   Yes Historical Provider, MD  Multiple Vitamin (MULTIVITAMIN WITH MINERALS) TABS tablet Take 1 tablet by mouth daily. Centrum   Yes Historical Provider, MD  omeprazole (PRILOSEC) 20 MG capsule Take 20 mg by mouth at bedtime.    Yes Historical Provider, MD  PRESCRIPTION MEDICATION Inhale into the lungs at bedtime. CPAP   Yes Historical Provider, MD  simvastatin (ZOCOR) 40 MG tablet Take 40 mg by mouth at bedtime.    Yes Historical Provider, MD   BP 108/46 mmHg  Pulse 60  Temp(Src) 100.3 F (37.9 C) (Oral)  Resp 16  Ht 5\' 9"  (1.753 m)  Wt 166 lb 8.9 oz (75.55 kg)  BMI 24.59 kg/m2  SpO2 98% Physical Exam  Constitutional: He is oriented to person, place, and time. He appears well-developed and well-nourished. No distress.  HENT:  Head: Normocephalic and atraumatic.  Mouth/Throat: Oropharynx is clear and moist.  Eyes: EOM are normal. Pupils are equal, round, and reactive to light.  Neck: Normal range of motion. Neck supple. No JVD present.  Cardiovascular: Normal rate and regular rhythm.   Pulmonary/Chest: Effort normal and breath sounds normal. No respiratory distress. He has no wheezes. He has no rales. He exhibits no tenderness.  Abdominal: Soft. Bowel sounds are normal. He exhibits no distension and no mass. There is no tenderness. There is no rebound and no guarding.  Musculoskeletal: Normal range of motion. He exhibits no edema or tenderness.  No lower sugary swelling, asymmetry or tenderness. Distal pulses are  equal and intact.  Neurological: He is alert and oriented to person, place, and time.  5/5 motor in all extremities. Sensation intact.  Skin: Skin is warm and dry. No rash noted. No erythema.  Psychiatric: He has a normal mood and affect. His behavior is normal.  Nursing note and vitals reviewed.   ED Course  Procedures (including critical care time) Labs Review Labs Reviewed  CBC WITH DIFFERENTIAL/PLATELET - Abnormal; Notable for the following:    Hemoglobin 12.5 (*)    HCT 36.8 (*)    RDW 15.8 (*)    All other components within normal limits  COMPREHENSIVE METABOLIC PANEL - Abnormal; Notable for the following:    CO2 20 (*)    Glucose, Bld 173 (*)    BUN 23 (*)    Creatinine, Ser 1.68 (*)    Total Protein 5.8 (*)    Albumin 3.0 (*)  GFR calc non Af Amer 38 (*)    GFR calc Af Amer 44 (*)    Anion gap 16 (*)    All other components within normal limits  TROPONIN I - Abnormal; Notable for the following:    Troponin I 1.22 (*)    All other components within normal limits  CBC - Abnormal; Notable for the following:    Hemoglobin 12.2 (*)    HCT 36.3 (*)    RDW 15.8 (*)    All other components within normal limits  COMPREHENSIVE METABOLIC PANEL - Abnormal; Notable for the following:    BUN 23 (*)    Creatinine, Ser 1.67 (*)    Total Protein 5.4 (*)    Albumin 2.8 (*)    AST 42 (*)    GFR calc non Af Amer 38 (*)    GFR calc Af Amer 45 (*)    All other components within normal limits  MAGNESIUM - Abnormal; Notable for the following:    Magnesium 1.4 (*)    All other components within normal limits  TROPONIN I - Abnormal; Notable for the following:    Troponin I 2.69 (*)    All other components within normal limits  TROPONIN I - Abnormal; Notable for the following:    Troponin I 5.67 (*)    All other components within normal limits  TROPONIN I - Abnormal; Notable for the following:    Troponin I 8.94 (*)    All other components within normal limits  HEMOGLOBIN A1C  - Abnormal; Notable for the following:    Hgb A1c MFr Bld 8.4 (*)    All other components within normal limits  BRAIN NATRIURETIC PEPTIDE - Abnormal; Notable for the following:    B Natriuretic Peptide 866.7 (*)    All other components within normal limits  BASIC METABOLIC PANEL - Abnormal; Notable for the following:    BUN 24 (*)    Creatinine, Ser 1.61 (*)    GFR calc non Af Amer 40 (*)    GFR calc Af Amer 47 (*)    All other components within normal limits  LIPID PANEL - Abnormal; Notable for the following:    HDL 32 (*)    All other components within normal limits  CBC - Abnormal; Notable for the following:    Hemoglobin 11.8 (*)    HCT 35.3 (*)    RDW 15.9 (*)    All other components within normal limits  GLUCOSE, CAPILLARY - Abnormal; Notable for the following:    Glucose-Capillary 144 (*)    All other components within normal limits  CBC - Abnormal; Notable for the following:    RBC 4.10 (*)    Hemoglobin 11.2 (*)    HCT 34.5 (*)    RDW 16.3 (*)    All other components within normal limits  CBC - Abnormal; Notable for the following:    Hemoglobin 12.6 (*)    RDW 16.2 (*)    All other components within normal limits  CREATININE, SERUM - Abnormal; Notable for the following:    Creatinine, Ser 1.66 (*)    GFR calc non Af Amer 39 (*)    GFR calc Af Amer 45 (*)    All other components within normal limits  BASIC METABOLIC PANEL - Abnormal; Notable for the following:    Glucose, Bld 153 (*)    Creatinine, Ser 1.49 (*)    Calcium 8.4 (*)    GFR calc non Af  Amer 44 (*)    GFR calc Af Amer 51 (*)    All other components within normal limits  GLUCOSE, CAPILLARY - Abnormal; Notable for the following:    Glucose-Capillary 122 (*)    All other components within normal limits  GLUCOSE, CAPILLARY - Abnormal; Notable for the following:    Glucose-Capillary 161 (*)    All other components within normal limits  GLUCOSE, CAPILLARY - Abnormal; Notable for the following:     Glucose-Capillary 147 (*)    All other components within normal limits  GLUCOSE, CAPILLARY - Abnormal; Notable for the following:    Glucose-Capillary 162 (*)    All other components within normal limits  TROPONIN I - Abnormal; Notable for the following:    Troponin I 6.02 (*)    All other components within normal limits  TROPONIN I - Abnormal; Notable for the following:    Troponin I 14.26 (*)    All other components within normal limits  TROPONIN I - Abnormal; Notable for the following:    Troponin I 13.78 (*)    All other components within normal limits  GLUCOSE, CAPILLARY - Abnormal; Notable for the following:    Glucose-Capillary 128 (*)    All other components within normal limits  GLUCOSE, CAPILLARY - Abnormal; Notable for the following:    Glucose-Capillary 111 (*)    All other components within normal limits  CBC - Abnormal; Notable for the following:    WBC 10.7 (*)    RBC 3.79 (*)    Hemoglobin 10.9 (*)    HCT 32.0 (*)    RDW 16.4 (*)    All other components within normal limits  GLUCOSE, CAPILLARY - Abnormal; Notable for the following:    Glucose-Capillary 161 (*)    All other components within normal limits  GLUCOSE, CAPILLARY - Abnormal; Notable for the following:    Glucose-Capillary 138 (*)    All other components within normal limits  GLUCOSE, CAPILLARY - Abnormal; Notable for the following:    Glucose-Capillary 171 (*)    All other components within normal limits  TROPONIN I - Abnormal; Notable for the following:    Troponin I 19.11 (*)    All other components within normal limits  TROPONIN I - Abnormal; Notable for the following:    Troponin I 20.06 (*)    All other components within normal limits  TROPONIN I - Abnormal; Notable for the following:    Troponin I 25.99 (*)    All other components within normal limits  GLUCOSE, CAPILLARY - Abnormal; Notable for the following:    Glucose-Capillary 143 (*)    All other components within normal limits   BASIC METABOLIC PANEL - Abnormal; Notable for the following:    Chloride 100 (*)    Glucose, Bld 230 (*)    BUN 25 (*)    Creatinine, Ser 2.03 (*)    Calcium 8.2 (*)    GFR calc non Af Amer 30 (*)    GFR calc Af Amer 35 (*)    All other components within normal limits  GLUCOSE, CAPILLARY - Abnormal; Notable for the following:    Glucose-Capillary 222 (*)    All other components within normal limits  GLUCOSE, CAPILLARY - Abnormal; Notable for the following:    Glucose-Capillary 194 (*)    All other components within normal limits  CBC - Abnormal; Notable for the following:    RBC 3.55 (*)    Hemoglobin 10.0 (*)  HCT 29.7 (*)    RDW 16.2 (*)    All other components within normal limits  GLUCOSE, CAPILLARY - Abnormal; Notable for the following:    Glucose-Capillary 199 (*)    All other components within normal limits  GLUCOSE, CAPILLARY - Abnormal; Notable for the following:    Glucose-Capillary 132 (*)    All other components within normal limits  GLUCOSE, CAPILLARY - Abnormal; Notable for the following:    Glucose-Capillary 128 (*)    All other components within normal limits  GLUCOSE, CAPILLARY - Abnormal; Notable for the following:    Glucose-Capillary 108 (*)    All other components within normal limits  GLUCOSE, CAPILLARY - Abnormal; Notable for the following:    Glucose-Capillary 195 (*)    All other components within normal limits  GLUCOSE, CAPILLARY - Abnormal; Notable for the following:    Glucose-Capillary 191 (*)    All other components within normal limits  GLUCOSE, CAPILLARY - Abnormal; Notable for the following:    Glucose-Capillary 202 (*)    All other components within normal limits  HEPARIN LEVEL (UNFRACTIONATED)  TSH  GLUCOSE, CAPILLARY  HEPARIN LEVEL (UNFRACTIONATED)  PROTIME-INR  GLUCOSE, CAPILLARY  GLUCOSE, CAPILLARY  GLUCOSE, CAPILLARY  CBC    Imaging Review No results found. I have personally reviewed and evaluated these images and lab  results as part of my medical decision-making.   EKG Interpretation   Date/Time:  Thursday March 02 2016 22:01:28 EDT Ventricular Rate:  109 PR Interval:  181 QRS Duration: 79 QT Interval:  316 QTC Calculation: 425 R Axis:   -10 Text Interpretation:  Sinus tachycardia No significant change since last  tracing Confirmed by YAO  MD, Lannis Lichtenwalner ([id number]) on 03/04/2016 1:35:57 PM      MDM   Final diagnoses:  Unstable angina ([place])    Patient remains asymptomatic. Discussed with Dr. Claiborne Billings who reviewed the patient's EKG. Advised starting on heparin and will admit to step down bed.    Julianne Rice, MD 03/06/16 2211

## 2016-03-02 NOTE — ED Notes (Signed)
Pt reports chest pressure that radiates "from arm pit to arm pit."  He reports that it started earlier tonight but "fixed itself."  The pain continued several more times until he told his wife and she called EMS.  No cardiac hx but his father died of a MI at age 76. Given 1 nitro and 324 ASA by EMS, 18G in L AC, CBG of 253 (is diabetic).  Pain is now relieved.

## 2016-03-02 NOTE — H&P (Signed)
Carlos Wolf is an 75 y.o. male.    Chief Complaint: chest discomfort Primary cardiologist:  HPI: Carlos Wolf is a 75 yo man with PMH of CAD s/p CABG 2002 at [facility], T2DM, dyslipidemia, hypertension, colon cancer s/p surgery 01/2014 who presents with chest discomfort. He is accompanied by his family. He says the current chest discomfort started as left-sided chest pain, heavy/pressure sensation and radiated across his chest to the right. The pain occurred around 18:00 and gradually went away over the course of 60 minutes. He remembers similar symptoms to before the heart cath in 2002. He did not have an MI at that time. He continues to remains active - he was able to mow his lawn yesterday with a push mower. He does notice some positional (sitting to standing) mild lightheadedness at times. He has done well per patient with colectomy for colon cancer - 2015 along with radiation. He continues to follow at [facility] every 6 months.   Otherwise, he denies bleeding issues, no sick contacts, no recent infectious symptoms, no upcoming surgeries. He was given aspirin en route to hospital, and heparin gtt started in ER. ECG abnormal in inferior leads but he has remained chest pain free. Initial troponin elevated at 1.22.    Past Medical History  Diagnosis Date  . Hypertension   . High cholesterol   . Diabetes mellitus   . Acid reflux   . Occult GI bleeding     per patient blood in rectum my only problem  . Skin cancer     to nose  . Diabetes mellitus ([place]) 02/04/2013  . Colon cancer [facility])     Past Surgical History  Procedure Laterality Date  . Cardiac surgery    . Tonsillectomy    . Skin cancer excision      nose  . 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 Circumcision  10/24/2011    Procedure: CIRCUMCISION ADULT;  Surgeon: Marissa Nestle;  [facility];  Service: Urology;  Laterality: N/A;  with repair of glans penis  . Cystoscopy  10/24/2011    Procedure: CYSTOSCOPY FLEXIBLE;   Surgeon: Marissa Nestle;  Location: [facility];  Service: Urology;;  . Esophagogastroduodenoscopy N/A 12/13/2012    Procedure: ESOPHAGOGASTRODUODENOSCOPY (EGD);  Surgeon: Rogene Houston, MD;  [facility];  Service: Endoscopy;  Laterality: N/A;  . Flexible sigmoidoscopy N/A 12/13/2012    Procedure: FLEXIBLE SIGMOIDOSCOPY;  Surgeon: Rogene Houston, MD;  [facility];  Service: Endoscopy;  Laterality: N/A;  . Colonoscopy N/A 12/27/2012    Procedure: COLONOSCOPY;  Surgeon: Rogene Houston, MD;  [facility];  Service: Endoscopy;  Laterality: N/A;  730-rescheduled to 200 Ann to notify pt  . Eus N/A 01/16/2013    Procedure: LOWER ENDOSCOPIC ULTRASOUND (EUS) radial only, 60 minutes, staging of rectal cancer, colonoscopy to follow;  Surgeon: Milus Banister, MD;  [facility];  Service: Endoscopy;  Laterality: N/A;  . Colonoscopy N/A 01/16/2013    Procedure: COLONOSCOPY;  Surgeon: Milus Banister, MD;  [facility];  Service: Endoscopy;  Laterality: N/A;  . Cataract extraction Right   . Hemicolectomy      Family History  Problem Relation Age of Onset  . Stroke Mother   . Heart failure Father   . Heart failure Brother   . Anesthesia problems Neg Hx   . Hypotension Neg Hx   . Malignant hyperthermia Neg Hx   .  Pseudochol deficiency Neg Hx   . Colon cancer Neg Hx   . Colon polyps Neg Hx    Social History:  reports that he has never smoked. He has never used smokeless tobacco. He reports that he does not drink alcohol or use illicit drugs.  Allergies:  Allergies  Allergen Reactions  . Docusate Sodium Hives  . Neosporin [Neomycin-Bacitracin Zn-Polymyx] Itching  . Percocet [Oxycodone-Acetaminophen] Nausea And Vomiting  . Adhesive [Tape] Itching, Rash and Other (See Comments)    Also pulls skin if left on too long -Please use paper tape     (Not in a hospital admission)  No results found for this or any previous visit (from the past 48  hour(s)). No results found.  Review of Systems  Constitutional: Positive for diaphoresis. Negative for fever, chills and weight loss.  HENT: Positive for hearing loss. Negative for ear pain and tinnitus.   Eyes: Negative for double vision and photophobia.  Respiratory: Negative for cough, hemoptysis and sputum production.   Cardiovascular: Positive for chest pain. Negative for palpitations and orthopnea.  Gastrointestinal: Negative for nausea, vomiting, abdominal pain, blood in stool and melena.  Genitourinary: Negative for dysuria and urgency.  Musculoskeletal: Negative for myalgias.  Neurological: Negative for tingling, tremors and sensory change.  Endo/Heme/Allergies: Negative for polydipsia. Does not bruise/bleed easily.  Psychiatric/Behavioral: Positive for memory loss. Negative for depression, suicidal ideas and substance abuse.    Blood pressure 140/87, pulse 115, resp. rate 17, SpO2 100 %. Physical Exam  Nursing note and vitals reviewed. Constitutional: He is oriented to person, place, and time. He appears well-developed and well-nourished. No distress.  HENT:  Head: Normocephalic and atraumatic.  Nose: Nose normal.  Mouth/Throat: Oropharynx is clear and moist. No oropharyngeal exudate.  Eyes: Conjunctivae and EOM are normal. Pupils are equal, round, and reactive to light. No scleral icterus.  Neck: Normal range of motion. Neck supple. No JVD present. No tracheal deviation present.  Cardiovascular: Normal rate, regular rhythm, normal heart sounds and intact distal pulses.  Exam reveals no gallop.   No murmur heard. Respiratory: Effort normal and breath sounds normal. No respiratory distress. He has no wheezes. He has no rales.  GI: Soft. Bowel sounds are normal. He exhibits no distension. There is no tenderness. There is no rebound.  Musculoskeletal: Normal range of motion. He exhibits edema. He exhibits no tenderness.  Trace edema bilaterally  Neurological: He is alert and  oriented to person, place, and time. No cranial nerve deficit. Coordination normal.  Skin: Skin is warm and dry. No rash noted. He is not diaphoretic. No erythema.  Psychiatric: He has a normal mood and affect. His behavior is normal. Thought content normal.    Labs reviewed;  EKG: inferior ST changes, sinus tachycardia, no reciprocal changes; repeat EKG: inferior Qs, infarct, age indeterminate, sinus tachycardia Chest x-ray: no acute process Creatinine 1.68, bun 23, troponin 1.22  Assessment/Plan Mr. Lackland is a 75 yo man with PMH of CAD s/p CABG, T2DM, dyslipidemia, hypertension, colon cancer s/p surgery 01/2014 who presents with chest discomfort. Differential diagnosis is musculoskeletal pain, esophageal spasm, GERD, aortic dissection, pericarditis, ACS/NSTEMI among other etiologies. I favor a diagnosis of NSTEMI given character of discomfort and elevated troponin. Will start heparin, continue aspirin, admit to stepdown and watch closely for any changes in symptoms, low threshold to active cath lab. I discussed these findings and plan with Mr. Issa and his wife.   Problem List Chest Pain//NSTEMI Coronary artery disease s/p previous CABG T2DM Dyslipidemia  Hypertension Prior Colon Cancer Acute on chronic renal insufficiency/failure Plan 1. NPO after MN for LHC in AM. I discussed this plan with the patient.  2. trend cardiac markers, observation on telemetry, admit to telemetry 3. plan for LHC in AM; if symptoms change, low threshold to activate cath lab 4. asa 81 mg, heparin gtt, metoprolol 12.5 mg bid  5. atorvastatin 80 mg qHS first dose now 6. hba1c, tsh, lipid panel, BNP 7. Echocardiogram in AM 8. Insulin sliding scale; hold metformin 9. Gentle IV hydration given lack of PO intake ~ 500 ml NS over 5 hours Jules Husbands, MD 03/02/2016, 10:29 PM

## 2016-03-03 ENCOUNTER — Encounter (HOSPITAL_COMMUNITY)
Admission: EM | Disposition: A | Discharge status: Rehabilitation Facility | Payer: Self-pay | Source: Home / Self Care | Attending: Interventional Cardiology

## 2016-03-03 ENCOUNTER — Inpatient Hospital Stay (HOSPITAL_COMMUNITY): Payer: Medicare Other

## 2016-03-03 DIAGNOSIS — R079 Chest pain, unspecified: Secondary | ICD-10-CM

## 2016-03-03 DIAGNOSIS — I200 Unstable angina: Secondary | ICD-10-CM | POA: Insufficient documentation

## 2016-03-03 DIAGNOSIS — R4182 Altered mental status, unspecified: Secondary | ICD-10-CM

## 2016-03-03 DIAGNOSIS — I214 Non-ST elevation (NSTEMI) myocardial infarction: Principal | ICD-10-CM

## 2016-03-03 DIAGNOSIS — I25710 Atherosclerosis of autologous vein coronary artery bypass graft(s) with unstable angina pectoris: Secondary | ICD-10-CM

## 2016-03-03 DIAGNOSIS — H539 Unspecified visual disturbance: Secondary | ICD-10-CM

## 2016-03-03 HISTORY — PX: CARDIAC CATHETERIZATION: SHX172

## 2016-03-03 LAB — TROPONIN I
TROPONIN I: 5.67 ng/mL — AB (ref <0.031)
Troponin I: 2.69 ng/mL (ref <0.031)
Troponin I: 8.94 ng/mL (ref <0.031)

## 2016-03-03 LAB — COMPREHENSIVE METABOLIC PANEL
ALBUMIN: 2.8 g/dL — AB (ref 3.5–5.0)
ALK PHOS: 66 U/L (ref 38–126)
ALT: 20 U/L (ref 17–63)
AST: 42 U/L — AB (ref 15–41)
Anion gap: 14 (ref 5–15)
BILIRUBIN TOTAL: 0.6 mg/dL (ref 0.3–1.2)
BUN: 23 mg/dL — AB (ref 6–20)
CALCIUM: 9 mg/dL (ref 8.9–10.3)
CO2: 23 mmol/L (ref 22–32)
CREATININE: 1.67 mg/dL — AB (ref 0.61–1.24)
Chloride: 106 mmol/L (ref 101–111)
GFR calc Af Amer: 45 mL/min — ABNORMAL LOW (ref >60)
GFR, EST NON AFRICAN AMERICAN: 38 mL/min — AB (ref >60)
GLUCOSE: 74 mg/dL (ref 65–99)
POTASSIUM: 4.2 mmol/L (ref 3.5–5.1)
Sodium: 143 mmol/L (ref 135–145)
TOTAL PROTEIN: 5.4 g/dL — AB (ref 6.5–8.1)

## 2016-03-03 LAB — BASIC METABOLIC PANEL
ANION GAP: 13 (ref 5–15)
BUN: 24 mg/dL — ABNORMAL HIGH (ref 6–20)
CALCIUM: 9.1 mg/dL (ref 8.9–10.3)
CHLORIDE: 105 mmol/L (ref 101–111)
CO2: 24 mmol/L (ref 22–32)
CREATININE: 1.61 mg/dL — AB (ref 0.61–1.24)
GFR calc non Af Amer: 40 mL/min — ABNORMAL LOW (ref >60)
GFR, EST AFRICAN AMERICAN: 47 mL/min — AB (ref >60)
Glucose, Bld: 71 mg/dL (ref 65–99)
Potassium: 4.2 mmol/L (ref 3.5–5.1)
SODIUM: 142 mmol/L (ref 135–145)

## 2016-03-03 LAB — GLUCOSE, CAPILLARY
GLUCOSE-CAPILLARY: 68 mg/dL (ref 65–99)
GLUCOSE-CAPILLARY: 85 mg/dL (ref 65–99)
Glucose-Capillary: 144 mg/dL — ABNORMAL HIGH (ref 65–99)
Glucose-Capillary: 99 mg/dL (ref 65–99)
Glucose-Capillary: 75 mg/dL (ref 65–99)
Glucose-Capillary: 122 mg/dL — ABNORMAL HIGH (ref 65–99)

## 2016-03-03 LAB — CBC
HEMATOCRIT: 35.3 % — AB (ref 39.0–52.0)
HEMATOCRIT: 36.3 % — AB (ref 39.0–52.0)
HEMATOCRIT: 39.3 % (ref 39.0–52.0)
HEMOGLOBIN: 11.8 g/dL — AB (ref 13.0–17.0)
HEMOGLOBIN: 12.2 g/dL — AB (ref 13.0–17.0)
HEMOGLOBIN: 12.6 g/dL — AB (ref 13.0–17.0)
MCH: 27.6 pg (ref 26.0–34.0)
MCH: 27.4 pg (ref 26.0–34.0)
MCH: 26.8 pg (ref 26.0–34.0)
MCHC: 33.4 g/dL (ref 30.0–36.0)
MCHC: 33.6 g/dL (ref 30.0–36.0)
MCHC: 32.1 g/dL (ref 30.0–36.0)
MCV: 82.5 fL (ref 78.0–100.0)
MCV: 81.4 fL (ref 78.0–100.0)
MCV: 83.6 fL (ref 78.0–100.0)
Platelets: 176 10*3/uL (ref 150–400)
Platelets: 190 10*3/uL (ref 150–400)
Platelets: 192 10*3/uL (ref 150–400)
RBC: 4.28 MIL/uL (ref 4.22–5.81)
RBC: 4.46 MIL/uL (ref 4.22–5.81)
RBC: 4.7 MIL/uL (ref 4.22–5.81)
RDW: 15.9 % — AB (ref 11.5–15.5)
RDW: 15.8 % — ABNORMAL HIGH (ref 11.5–15.5)
RDW: 16.2 % — AB (ref 11.5–15.5)
WBC: 9 10*3/uL (ref 4.0–10.5)
WBC: 9 10*3/uL (ref 4.0–10.5)
WBC: 9.1 10*3/uL (ref 4.0–10.5)

## 2016-03-03 LAB — TSH: TSH: 1.912 u[IU]/mL (ref 0.350–4.500)

## 2016-03-03 LAB — HEPARIN LEVEL (UNFRACTIONATED)
HEPARIN UNFRACTIONATED: 0.46 [IU]/mL (ref 0.30–0.70)
Heparin Unfractionated: 0.39 [IU]/mL (ref 0.30–0.70)

## 2016-03-03 LAB — PROTIME-INR
INR: 1.17 (ref 0.00–1.49)
PROTHROMBIN TIME: 15.1 s (ref 11.6–15.2)

## 2016-03-03 LAB — LIPID PANEL
CHOLESTEROL: 78 mg/dL (ref 0–200)
HDL: 32 mg/dL — AB (ref >40)
LDL Cholesterol: 23 mg/dL (ref 0–99)
TRIGLYCERIDES: 113 mg/dL (ref <150)
Total CHOL/HDL Ratio: 2.4 RATIO
VLDL: 23 mg/dL (ref 0–40)

## 2016-03-03 LAB — CREATININE, SERUM
Creatinine, Ser: 1.66 mg/dL — ABNORMAL HIGH (ref 0.61–1.24)
GFR calc non Af Amer: 39 mL/min — ABNORMAL LOW (ref >60)
GFR, EST AFRICAN AMERICAN: 45 mL/min — AB (ref >60)

## 2016-03-03 LAB — ECHOCARDIOGRAM COMPLETE

## 2016-03-03 LAB — BRAIN NATRIURETIC PEPTIDE: B Natriuretic Peptide: 866.7 pg/mL — ABNORMAL HIGH (ref 0.0–100.0)

## 2016-03-03 LAB — MAGNESIUM: MAGNESIUM: 1.4 mg/dL — AB (ref 1.7–2.4)

## 2016-03-03 SURGERY — LEFT HEART CATH AND CORS/GRAFTS ANGIOGRAPHY
Anesthesia: LOCAL

## 2016-03-03 MED ORDER — SODIUM CHLORIDE 0.9 % IV SOLN: 250.0000 mL | INTRAVENOUS | Status: AC | PRN

## 2016-03-03 MED ORDER — MIDAZOLAM HCL 2 MG/2ML IJ SOLN
INTRAMUSCULAR | Status: AC
  Filled 2016-03-03: qty 2

## 2016-03-03 MED ORDER — FENTANYL CITRATE (PF) 100 MCG/2ML IJ SOLN
INTRAMUSCULAR | Status: AC
  Filled 2016-03-03: qty 2

## 2016-03-03 MED ORDER — ASPIRIN 81 MG PO CHEW: 81.0000 mg | CHEWABLE_TABLET | ORAL | Status: DC

## 2016-03-03 MED ORDER — ENOXAPARIN SODIUM 40 MG/0.4ML ~~LOC~~ SOLN
40.0000 mg | SUBCUTANEOUS | Status: DC
  Administered 2016-03-04 – 2016-03-09 (×6): 40 mg via SUBCUTANEOUS
  Filled 2016-03-03 (×6): qty 0.4

## 2016-03-03 MED ORDER — LORAZEPAM 2 MG/ML IJ SOLN
1.0000 mg | Freq: Once | INTRAMUSCULAR | Status: AC
  Administered 2016-03-04: 1 mg via INTRAVENOUS

## 2016-03-03 MED ORDER — HEPARIN SODIUM (PORCINE) 1000 UNIT/ML IJ SOLN
INTRAMUSCULAR | Status: AC
  Filled 2016-03-03: qty 1

## 2016-03-03 MED ORDER — HEPARIN (PORCINE) IN NACL 2-0.9 UNIT/ML-% IJ SOLN
INTRAMUSCULAR | Status: AC
  Filled 2016-03-03: qty 500

## 2016-03-03 MED ORDER — ONDANSETRON HCL 4 MG/2ML IJ SOLN: 4.0000 mg | Freq: Four times a day (QID) | INTRAMUSCULAR | Status: DC | PRN

## 2016-03-03 MED ORDER — SODIUM CHLORIDE 0.9 % IV SOLN: 250.0000 mL | INTRAVENOUS | Status: DC | PRN

## 2016-03-03 MED ORDER — FENTANYL CITRATE (PF) 100 MCG/2ML IJ SOLN
INTRAMUSCULAR | Status: DC | PRN
  Administered 2016-03-03 (×2): 25 ug via INTRAVENOUS

## 2016-03-03 MED ORDER — NITROGLYCERIN 0.4 MG SL SUBL
0.4000 mg | SUBLINGUAL_TABLET | SUBLINGUAL | Status: DC | PRN
Start: 2016-03-03 — End: 2016-03-09

## 2016-03-03 MED ORDER — LIDOCAINE HCL (PF) 1 % IJ SOLN
INTRAMUSCULAR | Status: DC | PRN
  Administered 2016-03-03: 17 mL

## 2016-03-03 MED ORDER — LORAZEPAM 2 MG/ML IJ SOLN
1.0000 mg | INTRAMUSCULAR | Status: DC | PRN
  Administered 2016-03-03 – 2016-03-04 (×2): 1 mg via INTRAVENOUS
  Filled 2016-03-03 (×2): qty 1

## 2016-03-03 MED ORDER — HEPARIN (PORCINE) IN NACL 2-0.9 UNIT/ML-% IJ SOLN
INTRAMUSCULAR | Status: DC | PRN
  Administered 2016-03-03: 1500 mL

## 2016-03-03 MED ORDER — HEPARIN (PORCINE) IN NACL 2-0.9 UNIT/ML-% IJ SOLN
INTRAMUSCULAR | Status: AC
  Filled 2016-03-03: qty 1000

## 2016-03-03 MED ORDER — SODIUM CHLORIDE 0.9 % IV SOLN: INTRAVENOUS | Status: DC

## 2016-03-03 MED ORDER — ISOSORBIDE MONONITRATE ER 30 MG PO TB24
30.0000 mg | ORAL_TABLET | Freq: Every day | ORAL | Status: DC
  Administered 2016-03-03 – 2016-03-09 (×7): 30 mg via ORAL
  Filled 2016-03-03 (×7): qty 1

## 2016-03-03 MED ORDER — ACETAMINOPHEN 325 MG PO TABS: 650.0000 mg | ORAL_TABLET | ORAL | Status: DC | PRN

## 2016-03-03 MED ORDER — ASPIRIN EC 81 MG PO TBEC: 81.0000 mg | DELAYED_RELEASE_TABLET | Freq: Every day | ORAL | Status: DC

## 2016-03-03 MED ORDER — SODIUM CHLORIDE 0.9% FLUSH: 3.0000 mL | INTRAVENOUS | Status: DC | PRN

## 2016-03-03 MED ORDER — SODIUM CHLORIDE 0.9% FLUSH
3.0000 mL | Freq: Two times a day (BID) | INTRAVENOUS | Status: DC
  Administered 2016-03-03 – 2016-03-09 (×11): 3 mL via INTRAVENOUS

## 2016-03-03 MED ORDER — LORAZEPAM 2 MG/ML IJ SOLN
INTRAMUSCULAR | Status: AC
  Filled 2016-03-03: qty 1

## 2016-03-03 MED ORDER — DEXTROSE 50 % IV SOLN
INTRAVENOUS | Status: AC
  Administered 2016-03-03: 25 mL
  Filled 2016-03-03: qty 50

## 2016-03-03 MED ORDER — SODIUM CHLORIDE 0.9 % IV SOLN
INTRAVENOUS | Status: DC
  Administered 2016-03-03: 09:00:00 via INTRAVENOUS

## 2016-03-03 MED ORDER — SODIUM CHLORIDE 0.9 % IV SOLN: INTRAVENOUS | Status: AC

## 2016-03-03 MED ORDER — INSULIN ASPART 100 UNIT/ML ~~LOC~~ SOLN
0.0000 [IU] | SUBCUTANEOUS | Status: DC
  Administered 2016-03-04: 05:00:00 2 [IU] via SUBCUTANEOUS
  Administered 2016-03-04: 4 [IU] via SUBCUTANEOUS
  Administered 2016-03-04: 2 [IU] via SUBCUTANEOUS
  Administered 2016-03-04 – 2016-03-05 (×3): 4 [IU] via SUBCUTANEOUS
  Administered 2016-03-05: 2 [IU] via SUBCUTANEOUS
  Administered 2016-03-05: 4 [IU] via SUBCUTANEOUS
  Administered 2016-03-05: 6 [IU] via SUBCUTANEOUS
  Administered 2016-03-06 (×2): 4 [IU] via SUBCUTANEOUS
  Administered 2016-03-06 (×2): 2 [IU] via SUBCUTANEOUS

## 2016-03-03 MED ORDER — ASPIRIN 300 MG RE SUPP: 300.0000 mg | RECTAL | Status: AC

## 2016-03-03 MED ORDER — IOPAMIDOL (ISOVUE-370) INJECTION 76%
INTRAVENOUS | Status: DC | PRN
  Administered 2016-03-03: 100 mL via INTRAVENOUS

## 2016-03-03 MED ORDER — VITAMIN D 1000 UNITS PO TABS
5000.0000 [IU] | ORAL_TABLET | Freq: Every day | ORAL | Status: DC
  Administered 2016-03-03 – 2016-03-09 (×7): 5000 [IU] via ORAL
  Filled 2016-03-03 (×8): qty 5

## 2016-03-03 MED ORDER — ASPIRIN 81 MG PO CHEW: 324.0000 mg | CHEWABLE_TABLET | ORAL | Status: AC

## 2016-03-03 MED ORDER — ATORVASTATIN CALCIUM 80 MG PO TABS
80.0000 mg | ORAL_TABLET | Freq: Every day | ORAL | Status: DC
  Administered 2016-03-04 – 2016-03-08 (×5): 80 mg via ORAL
  Filled 2016-03-03 (×5): qty 1

## 2016-03-03 MED ORDER — LIDOCAINE HCL (PF) 1 % IJ SOLN
INTRAMUSCULAR | Status: AC
  Filled 2016-03-03: qty 30

## 2016-03-03 MED ORDER — METOPROLOL TARTRATE 12.5 MG HALF TABLET
12.5000 mg | ORAL_TABLET | Freq: Two times a day (BID) | ORAL | Status: DC
  Administered 2016-03-03 – 2016-03-05 (×7): 12.5 mg via ORAL
  Filled 2016-03-03 (×7): qty 1

## 2016-03-03 MED ORDER — SODIUM CHLORIDE 0.9% FLUSH
3.0000 mL | Freq: Two times a day (BID) | INTRAVENOUS | Status: DC
  Administered 2016-03-04 – 2016-03-09 (×9): 3 mL via INTRAVENOUS

## 2016-03-03 MED ORDER — SODIUM CHLORIDE 0.9% FLUSH
3.0000 mL | Freq: Two times a day (BID) | INTRAVENOUS | Status: DC
  Administered 2016-03-03: 10:00:00 3 mL via INTRAVENOUS

## 2016-03-03 MED ORDER — PANTOPRAZOLE SODIUM 40 MG PO TBEC
40.0000 mg | DELAYED_RELEASE_TABLET | Freq: Every day | ORAL | Status: DC
  Administered 2016-03-03 – 2016-03-09 (×7): 40 mg via ORAL
  Filled 2016-03-03 (×7): qty 1

## 2016-03-03 MED ORDER — ADULT MULTIVITAMIN W/MINERALS CH
1.0000 | ORAL_TABLET | Freq: Every day | ORAL | Status: DC
  Administered 2016-03-03 – 2016-03-09 (×7): 1 via ORAL
  Filled 2016-03-03 (×7): qty 1

## 2016-03-03 MED ORDER — ASPIRIN EC 81 MG PO TBEC
81.0000 mg | DELAYED_RELEASE_TABLET | Freq: Every day | ORAL | Status: DC
  Administered 2016-03-04 – 2016-03-08 (×5): 81 mg via ORAL
  Filled 2016-03-03 (×5): qty 1

## 2016-03-03 MED ORDER — ATORVASTATIN CALCIUM 80 MG PO TABS: 80.0000 mg | ORAL_TABLET | Freq: Every day | ORAL | Status: DC

## 2016-03-03 MED ORDER — ASPIRIN 81 MG PO CHEW
81.0000 mg | CHEWABLE_TABLET | ORAL | Status: AC
  Administered 2016-03-03: 81 mg via ORAL
  Filled 2016-03-03: qty 1

## 2016-03-03 MED ORDER — MIDAZOLAM HCL 2 MG/2ML IJ SOLN
INTRAMUSCULAR | Status: DC | PRN
  Administered 2016-03-03 (×2): 1 mg via INTRAVENOUS

## 2016-03-03 MED ORDER — IOPAMIDOL (ISOVUE-370) INJECTION 76%
INTRAVENOUS | Status: AC
  Filled 2016-03-03: qty 125

## 2016-03-03 SURGICAL SUPPLY — 8 items
CATH INFINITI 5 FR IM (CATHETERS) ×2 IMPLANT
CATH INFINITI 5 FR RCB (CATHETERS) ×2 IMPLANT
CATH INFINITI 5FR MULTPACK ANG (CATHETERS) ×2 IMPLANT
KIT HEART LEFT (KITS) ×2 IMPLANT
PACK CARDIAC CATHETERIZATION (CUSTOM PROCEDURE TRAY) ×2 IMPLANT
SHEATH PINNACLE 5F 10CM (SHEATH) ×2 IMPLANT
TRANSDUCER W/STOPCOCK (MISCELLANEOUS) ×2 IMPLANT
WIRE EMERALD 3MM-J .035X150CM (WIRE) ×2 IMPLANT

## 2016-03-03 NOTE — Consult Note (Signed)
Admission H&P    Chief Complaint: Altered mental status with agitation and confusion.  HPI: Carlos Wolf is an 75 y.o. male history diabetes mellitus, hypertension, hyperlipidemia and coronary artery disease, who underwent cardiac catheterization earlier today and was noted to have slurred speech and confusion as well as visual inattention on returning to his hospital room. He attempted to get out of bed multiple times and did not seem to realize that he was attached to his IV. He has required limb restraints. He said previous cardiac catheterizations and has had no known reaction to occasions. He was given Versed and fentanyl for the catheterization procedure. No focal weakness has been noted. His wife does not think that he has a facial droop. His speech has improved. Visual problems have persisted. CT scan of his head is pending. He is currently on aspirin, and is on Lovenox for DVT prevention.  Past Medical History  Diagnosis Date  . Hypertension   . High cholesterol   . Diabetes mellitus   . Acid reflux   . Occult GI bleeding     per patient blood in rectum my only problem  . Skin cancer     to nose  . Diabetes mellitus ([place]) 02/04/2013  . Colon cancer [facility])     Past Surgical History  Procedure Laterality Date  . Cardiac surgery    . Tonsillectomy    . Skin cancer excision      nose  . 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 Circumcision  10/24/2011    Procedure: CIRCUMCISION ADULT;  Surgeon: Marissa Nestle;  Location: [facility];  Service: Urology;  Laterality: N/A;  with repair of glans penis  . Cystoscopy  10/24/2011    Procedure: CYSTOSCOPY FLEXIBLE;  Surgeon: Marissa Nestle;  Location: [facility];  Service: Urology;;  . Esophagogastroduodenoscopy N/A 12/13/2012    Procedure: ESOPHAGOGASTRODUODENOSCOPY (EGD);  Surgeon: Rogene Houston, MD;  Location: [facility];  Service: Endoscopy;  Laterality: N/A;  . Flexible sigmoidoscopy N/A 12/13/2012    Procedure:  FLEXIBLE SIGMOIDOSCOPY;  Surgeon: Rogene Houston, MD;  Location: [facility];  Service: Endoscopy;  Laterality: N/A;  . Colonoscopy N/A 12/27/2012    Procedure: COLONOSCOPY;  Surgeon: Rogene Houston, MD;  Location: [facility];  Service: Endoscopy;  Laterality: N/A;  730-rescheduled to 200 Ann to notify pt  . Eus N/A 01/16/2013    Procedure: LOWER ENDOSCOPIC ULTRASOUND (EUS) radial only, 60 minutes, staging of rectal cancer, colonoscopy to follow;  Surgeon: Milus Banister, MD;  Location: [facility];  Service: Endoscopy;  Laterality: N/A;  . Colonoscopy N/A 01/16/2013    Procedure: COLONOSCOPY;  Surgeon: Milus Banister, MD;  Location: [facility];  Service: Endoscopy;  Laterality: N/A;  . Cataract extraction Right   . Hemicolectomy      Family History  Problem Relation Age of Onset  . Stroke Mother   . Heart failure Father   . Heart failure Brother   . Anesthesia problems Neg Hx   . Hypotension Neg Hx   . Malignant hyperthermia Neg Hx   . Pseudochol deficiency Neg Hx   . Colon cancer Neg Hx   . Colon polyps Neg Hx    Social History:  reports that he has never smoked. He has never used smokeless tobacco. He reports that he does not drink alcohol or use illicit drugs.  Allergies:  Allergies  Allergen Reactions  . Docusate Sodium Hives  .  Neosporin [Neomycin-Bacitracin Zn-Polymyx] Itching  . Percocet [Oxycodone-Acetaminophen] Nausea And Vomiting  . Adhesive [Tape] Itching, Rash and Other (See Comments)    Also pulls skin if left on too long -Please use paper tape    Medications Prior to Admission  Medication Sig Dispense Refill  . aspirin EC 81 MG tablet Take 81 mg by mouth at bedtime.     . Cholecalciferol (VITAMIN D3) 5000 units TABS Take 5,000 Units by mouth daily.    . Insulin Degludec (TRESIBA FLEXTOUCH) 200 UNIT/ML SOPN Inject 25-35 Units into the skin at bedtime. Inject 25 units if CBG <200, 35 units if CBG >200    . metFORMIN (GLUCOPHAGE) 850 MG tablet Take 850 mg  by mouth 2 (two) times daily with a meal.    . Multiple Vitamin (MULTIVITAMIN WITH MINERALS) TABS tablet Take 1 tablet by mouth daily. Centrum    . omeprazole (PRILOSEC) 20 MG capsule Take 20 mg by mouth at bedtime.     Marland Kitchen PRESCRIPTION MEDICATION Inhale into the lungs at bedtime. CPAP    . simvastatin (ZOCOR) 40 MG tablet Take 40 mg by mouth at bedtime.       ROS: History obtained from spouse and chart review  General ROS: negative for - chills, fatigue, fever, night sweats, weight gain or weight loss Psychological ROS: negative for - behavioral disorder, hallucinations, memory difficulties, mood swings or suicidal ideation Ophthalmic ROS: negative for - blurry vision, double vision, eye pain or loss of vision ENT ROS: negative for - epistaxis, nasal discharge, oral lesions, sore throat, tinnitus or vertigo Allergy and Immunology ROS: negative for - hives or itchy/watery eyes Hematological and Lymphatic ROS: negative for - bleeding problems, bruising or swollen lymph nodes Endocrine ROS: negative for - galactorrhea, hair pattern changes, polydipsia/polyuria or temperature intolerance Respiratory ROS: negative for - cough, hemoptysis, shortness of breath or wheezing Cardiovascular ROS: negative for - chest pain, dyspnea on exertion, edema or irregular heartbeat Gastrointestinal ROS: negative for - abdominal pain, diarrhea, hematemesis, nausea/vomiting or stool incontinence Genito-Urinary ROS: negative for - dysuria, hematuria, incontinence or urinary frequency/urgency Musculoskeletal ROS: negative for - joint swelling or muscular weakness Neurological ROS: as noted in HPI Dermatological ROS: negative for rash and skin lesion changes  Physical Examination: Blood pressure 148/65, pulse 108, temperature 98.5 F (36.9 C), temperature source Oral, resp. rate 24, height _0  (1.702 m), weight 76.975 kg (169 lb 11.2 oz), SpO2 97 %.  HEENT-  Normocephalic, no lesions, without obvious abnormality.   Normal external eye and conjunctiva.  Normal TM's bilaterally.  Normal auditory canals and external ears. Normal external nose, mucus membranes and septum.  Normal pharynx. Neck supple with no masses, nodes, nodules or enlargement. Cardiovascular - regular rate and rhythm, S1, S2 normal, no murmur, click, rub or gallop Lungs - chest clear, no wheezing, rales, normal symmetric air entry Abdomen - soft, non-tender; bowel sounds normal; no masses,  no organomegaly Extremities - no joint deformities, effusion, or inflammation  Neurologic Examination: Mental Status: Alert, oriented to person and time but disoriented to place, minimal agitated.  Speech fluent without evidence of aphasia. Able to follow commands without difficulty. Cranial Nerves: II-patient was unable to accurately count fingers and also did not react to visual threat from either side. III/IV/VI-Pupils were equal and reacted. Extraocular movements were full and conjugate.    V/VII-no facial numbness and no facial weakness. VIII-normal. X-normal speech and symmetrical palatal movement. Motor: 5/5 bilaterally with normal tone and bulk Sensory: Normal throughout. Deep Tendon  Reflexes: 1+ and symmetric. Plantars: Mute bilaterally Carotid auscultation: Normal  Results for orders placed or performed during the hospital encounter of 03/02/16 (from the past 48 hour(s))  CBC with Differential/Platelet     Status: Abnormal   Collection Time: 03/02/16 10:25 PM  Result Value Ref Range   WBC 9.3 4.0 - 10.5 K/uL   RBC 4.44 4.22 - 5.81 MIL/uL   Hemoglobin 12.5 (L) 13.0 - 17.0 g/dL   HCT 36.8 (L) 39.0 - 52.0 %   MCV 82.9 78.0 - 100.0 fL   MCH 28.2 26.0 - 34.0 pg   MCHC 34.0 30.0 - 36.0 g/dL   RDW 15.8 (H) 11.5 - 15.5 %   Platelets 190 150 - 400 K/uL   Neutrophils Relative % 81 %   Neutro Abs 7.6 1.7 - 7.7 K/uL   Lymphocytes Relative 10 %   Lymphs Abs 0.9 0.7 - 4.0 K/uL   Monocytes Relative 8 %   Monocytes Absolute 0.7 0.1 - 1.0  K/uL   Eosinophils Relative 1 %   Eosinophils Absolute 0.1 0.0 - 0.7 K/uL   Basophils Relative 0 %   Basophils Absolute 0.0 0.0 - 0.1 K/uL  Comprehensive metabolic panel     Status: Abnormal   Collection Time: 03/02/16 10:25 PM  Result Value Ref Range   Sodium 138 135 - 145 mmol/L   Potassium 4.1 3.5 - 5.1 mmol/L   Chloride 102 101 - 111 mmol/L   CO2 20 (L) 22 - 32 mmol/L   Glucose, Bld 173 (H) 65 - 99 mg/dL   BUN 23 (H) 6 - 20 mg/dL   Creatinine, Ser 1.68 (H) 0.61 - 1.24 mg/dL   Calcium 9.1 8.9 - 10.3 mg/dL   Total Protein 5.8 (L) 6.5 - 8.1 g/dL   Albumin 3.0 (L) 3.5 - 5.0 g/dL   AST 38 15 - 41 U/L   ALT 19 17 - 63 U/L   Alkaline Phosphatase 68 38 - 126 U/L   Total Bilirubin 0.8 0.3 - 1.2 mg/dL   GFR calc non Af Amer 38 (L) >60 mL/min   GFR calc Af Amer 44 (L) >60 mL/min    Comment: (NOTE) The eGFR has been calculated using the CKD EPI equation. This calculation has not been validated in all clinical situations. eGFR's persistently <60 mL/min signify possible Chronic Kidney Disease.    Anion gap 16 (H) 5 - 15  Troponin I     Status: Abnormal   Collection Time: 03/02/16 10:25 PM  Result Value Ref Range   Troponin I 1.22 (HH) <0.031 ng/mL    Comment:        POSSIBLE MYOCARDIAL ISCHEMIA. SERIAL TESTING RECOMMENDED. CRITICAL RESULT CALLED TO, READ BACK BY AND VERIFIED WITH: GLOUSTER J,RN 03/02/16 2309 WAYK   Comprehensive metabolic panel     Status: Abnormal   Collection Time: 03/03/16  3:27 AM  Result Value Ref Range   Sodium 143 135 - 145 mmol/L   Potassium 4.2 3.5 - 5.1 mmol/L   Chloride 106 101 - 111 mmol/L   CO2 23 22 - 32 mmol/L   Glucose, Bld 74 65 - 99 mg/dL   BUN 23 (H) 6 - 20 mg/dL   Creatinine, Ser 1.67 (H) 0.61 - 1.24 mg/dL   Calcium 9.0 8.9 - 10.3 mg/dL   Total Protein 5.4 (L) 6.5 - 8.1 g/dL   Albumin 2.8 (L) 3.5 - 5.0 g/dL   AST 42 (H) 15 - 41 U/L   ALT 20 17 - 63  U/L   Alkaline Phosphatase 66 38 - 126 U/L   Total Bilirubin 0.6 0.3 - 1.2 mg/dL    GFR calc non Af Amer 38 (L) >60 mL/min   GFR calc Af Amer 45 (L) >60 mL/min    Comment: (NOTE) The eGFR has been calculated using the CKD EPI equation. This calculation has not been validated in all clinical situations. eGFR's persistently <60 mL/min signify possible Chronic Kidney Disease.    Anion gap 14 5 - 15  Magnesium     Status: Abnormal   Collection Time: 03/03/16  3:27 AM  Result Value Ref Range   Magnesium 1.4 (L) 1.7 - 2.4 mg/dL  TSH     Status: None   Collection Time: 03/03/16  3:27 AM  Result Value Ref Range   TSH 1.912 0.350 - 4.500 uIU/mL  Troponin I     Status: Abnormal   Collection Time: 03/03/16  3:27 AM  Result Value Ref Range   Troponin I 2.69 (HH) <0.031 ng/mL    Comment:        POSSIBLE MYOCARDIAL ISCHEMIA. SERIAL TESTING RECOMMENDED. CRITICAL VALUE NOTED.  VALUE IS CONSISTENT WITH PREVIOUSLY REPORTED AND CALLED VALUE.   Basic metabolic panel     Status: Abnormal   Collection Time: 03/03/16  3:27 AM  Result Value Ref Range   Sodium 142 135 - 145 mmol/L   Potassium 4.2 3.5 - 5.1 mmol/L   Chloride 105 101 - 111 mmol/L   CO2 24 22 - 32 mmol/L   Glucose, Bld 71 65 - 99 mg/dL   BUN 24 (H) 6 - 20 mg/dL   Creatinine, Ser 1.61 (H) 0.61 - 1.24 mg/dL   Calcium 9.1 8.9 - 10.3 mg/dL   GFR calc non Af Amer 40 (L) >60 mL/min   GFR calc Af Amer 47 (L) >60 mL/min    Comment: (NOTE) The eGFR has been calculated using the CKD EPI equation. This calculation has not been validated in all clinical situations. eGFR's persistently <60 mL/min signify possible Chronic Kidney Disease.    Anion gap 13 5 - 15  CBC     Status: Abnormal   Collection Time: 03/03/16  3:27 AM  Result Value Ref Range   WBC 9.0 4.0 - 10.5 K/uL   RBC 4.28 4.22 - 5.81 MIL/uL   Hemoglobin 11.8 (L) 13.0 - 17.0 g/dL   HCT 35.3 (L) 39.0 - 52.0 %   MCV 82.5 78.0 - 100.0 fL   MCH 27.6 26.0 - 34.0 pg   MCHC 33.4 30.0 - 36.0 g/dL   RDW 15.9 (H) 11.5 - 15.5 %   Platelets 176 150 - 400 K/uL  Lipid  panel     Status: Abnormal   Collection Time: 03/03/16  3:28 AM  Result Value Ref Range   Cholesterol 78 0 - 200 mg/dL   Triglycerides 113 <150 mg/dL   HDL 32 (L) >40 mg/dL   Total CHOL/HDL Ratio 2.4 RATIO   VLDL 23 0 - 40 mg/dL   LDL Cholesterol 23 0 - 99 mg/dL    Comment:        Total Cholesterol/HDL:CHD Risk Coronary Heart Disease Risk Table                     Men   Women  1/2 Average Risk   3.4   3.3  Average Risk       5.0   4.4  2 X Average Risk   9.6   7.1  3 X Average Risk  23.4   11.0        Use the calculated Patient Ratio above and the CHD Risk Table to determine the patient's CHD Risk.        ATP III CLASSIFICATION (LDL):  <100     mg/dL   Optimal  295-188  mg/dL   Near or Above                    Optimal  130-159  mg/dL   Borderline  416-606  mg/dL   High  >301     mg/dL   Very High   Brain natriuretic peptide     Status: Abnormal   Collection Time: 03/03/16  3:43 AM  Result Value Ref Range   B Natriuretic Peptide 866.7 (H) 0.0 - 100.0 pg/mL  Heparin level (unfractionated)     Status: None   Collection Time: 03/03/16  6:18 AM  Result Value Ref Range   Heparin Unfractionated 0.39 0.30 - 0.70 IU/mL    Comment:        IF HEPARIN RESULTS ARE BELOW EXPECTED VALUES, AND PATIENT DOSAGE HAS BEEN CONFIRMED, SUGGEST FOLLOW UP TESTING OF ANTITHROMBIN III LEVELS.   CBC     Status: Abnormal   Collection Time: 03/03/16  6:18 AM  Result Value Ref Range   WBC 9.0 4.0 - 10.5 K/uL   RBC 4.46 4.22 - 5.81 MIL/uL   Hemoglobin 12.2 (L) 13.0 - 17.0 g/dL   HCT 60.1 (L) 09.3 - 23.5 %   MCV 81.4 78.0 - 100.0 fL   MCH 27.4 26.0 - 34.0 pg   MCHC 33.6 30.0 - 36.0 g/dL   RDW 57.3 (H) 22.0 - 25.4 %   Platelets 190 150 - 400 K/uL  Glucose, capillary     Status: None   Collection Time: 03/03/16  6:49 AM  Result Value Ref Range   Glucose-Capillary 68 65 - 99 mg/dL  Glucose, capillary     Status: Abnormal   Collection Time: 03/03/16  7:31 AM  Result Value Ref Range    Glucose-Capillary 144 (H) 65 - 99 mg/dL   Comment 1 Notify RN   Troponin I     Status: Abnormal   Collection Time: 03/03/16  9:18 AM  Result Value Ref Range   Troponin I 5.67 (HH) <0.031 ng/mL    Comment:        POSSIBLE MYOCARDIAL ISCHEMIA. SERIAL TESTING RECOMMENDED. CRITICAL VALUE NOTED.  VALUE IS CONSISTENT WITH PREVIOUSLY REPORTED AND CALLED VALUE.   Protime-INR     Status: None   Collection Time: 03/03/16 10:56 AM  Result Value Ref Range   Prothrombin Time 15.1 11.6 - 15.2 seconds   INR 1.17 0.00 - 1.49  Glucose, capillary     Status: None   Collection Time: 03/03/16 12:43 PM  Result Value Ref Range   Glucose-Capillary 99 65 - 99 mg/dL   Comment 1 Notify RN   Heparin level (unfractionated)     Status: None   Collection Time: 03/03/16  1:24 PM  Result Value Ref Range   Heparin Unfractionated 0.46 0.30 - 0.70 IU/mL    Comment:        IF HEPARIN RESULTS ARE BELOW EXPECTED VALUES, AND PATIENT DOSAGE HAS BEEN CONFIRMED, SUGGEST FOLLOW UP TESTING OF ANTITHROMBIN III LEVELS.   Troponin I     Status: Abnormal   Collection Time: 03/03/16  3:39 PM  Result Value Ref Range   Troponin I 8.94 (HH) <0.031  ng/mL    Comment:        POSSIBLE MYOCARDIAL ISCHEMIA. SERIAL TESTING RECOMMENDED. CRITICAL VALUE NOTED.  VALUE IS CONSISTENT WITH PREVIOUSLY REPORTED AND CALLED VALUE.   Glucose, capillary     Status: None   Collection Time: 03/03/16  3:58 PM  Result Value Ref Range   Glucose-Capillary 85 65 - 99 mg/dL  Glucose, capillary     Status: None   Collection Time: 03/03/16  5:36 PM  Result Value Ref Range   Glucose-Capillary 75 65 - 99 mg/dL  Glucose, capillary     Status: Abnormal   Collection Time: 03/03/16  7:40 PM  Result Value Ref Range   Glucose-Capillary 122 (H) 65 - 99 mg/dL  CBC     Status: Abnormal   Collection Time: 03/03/16  7:41 PM  Result Value Ref Range   WBC 9.1 4.0 - 10.5 K/uL   RBC 4.70 4.22 - 5.81 MIL/uL   Hemoglobin 12.6 (L) 13.0 - 17.0 g/dL   HCT  39.3 39.0 - 52.0 %   MCV 83.6 78.0 - 100.0 fL   MCH 26.8 26.0 - 34.0 pg   MCHC 32.1 30.0 - 36.0 g/dL   RDW 16.2 (H) 11.5 - 15.5 %   Platelets 192 150 - 400 K/uL  Creatinine, serum     Status: Abnormal   Collection Time: 03/03/16  7:41 PM  Result Value Ref Range   Creatinine, Ser 1.66 (H) 0.61 - 1.24 mg/dL   GFR calc non Af Amer 39 (L) >60 mL/min   GFR calc Af Amer 45 (L) >60 mL/min    Comment: (NOTE) The eGFR has been calculated using the CKD EPI equation. This calculation has not been validated in all clinical situations. eGFR's persistently <60 mL/min signify possible Chronic Kidney Disease.    Dg Chest Port 1 View  03/02/2016  CLINICAL DATA:  Patient with chest pain from the left side to the right side with associated pressure. EXAM: PORTABLE CHEST 1 VIEW COMPARISON:  Chest radiograph 07/14/2013. FINDINGS: Stable cardiac and mediastinal contours status post median sternotomy and CABG procedure. No consolidative pulmonary opacities. No pleural effusion or pneumothorax. AC joint degenerative changes. IMPRESSION: No acute cardiopulmonary process. Electronically Signed   By: Lovey Newcomer M.D.   On: 03/02/2016 22:29    Assessment: 75 y.o. male with acute change in mental status with confusion and agitation, as well as acute visual changes of unclear etiology. Acute stroke is suspected and patient has significant risk factors for stroke. New-onset seizure activity is less likely but cannot be completely ruled out at this point.  Stroke Risk Factors - diabetes mellitus, hyperlipidemia and hypertension  Plan: 1. MRI of the brain without contrast to rule out acute stroke 2. If MRI is positive for stroke, recommend MRA of the brain without contrast, carotid Doppler, hemoglobin A1c and fasting lipid panel, physical therapy and occupational therapy consults. 3. EEG, routine study, rule out encephalopathy as well as rule out seizure activity.  C.R. Nicole Kindred, MD [facility] [phone]  03/03/2016, 10:35 PM

## 2016-03-03 NOTE — Progress Notes (Signed)
ANTICOAGULATION CONSULT NOTE - Follow Up Consult  Pharmacy Consult for Heparin Indication: chest pain/ACS  Allergies  Allergen Reactions  . Docusate Sodium Hives  . Neosporin [Neomycin-Bacitracin Zn-Polymyx] Itching  . Percocet [Oxycodone-Acetaminophen] Nausea And Vomiting  . Adhesive [Tape] Itching, Rash and Other (See Comments)    Also pulls skin if left on too long -Please use paper tape    Patient Measurements: Heparin Dosing Weight: 77.1 kg  Vital Signs: Temp: 98.3 F (36.8 C) (04/28 1248) Temp Source: Oral (04/28 1248) BP: 103/49 mmHg (04/28 1248) Pulse Rate: 57 (04/28 1248)  Labs:  Recent Labs  03/02/16 2225 03/03/16 0327 03/03/16 0618 03/03/16 0918 03/03/16 1056 03/03/16 1324  HGB 12.5* 11.8* 12.2*  --   --   --   HCT 36.8* 35.3* 36.3*  --   --   --   PLT 190 176 190  --   --   --   LABPROT  --   --   --   --  15.1  --   INR  --   --   --   --  1.17  --   HEPARINUNFRC  --   --  0.39  --   --  0.46  CREATININE 1.68* 1.67*  1.61*  --   --   --   --   TROPONINI 1.22* 2.69*  --  5.67*  --   --     CrCl cannot be calculated (Unknown ideal weight.).   Medications:  Infusions:  . sodium chloride 75 mL/hr at 03/03/16 0835  . sodium chloride    . heparin 950 Units/hr (03/02/16 2318)    Assessment: 75 year old male on IV heparin for ACS/chest pain. Troponin up at 2.69 this AM. ECHO pending. Plan for cath today at 1630 PM.   Heparin level is therapeutic x2 on 950 units/hr.   Goal of Therapy:  Heparin level 0.3-0.7 units/ml Monitor platelets by anticoagulation protocol: Yes   Plan:  Continue heparin at 950 units/hr Follow-up post cath this PM Daily heparin level and CBC while on therapy.   Sloan Leiter, PharmD, BCPS Clinical Pharmacist [phone]  03/03/2016,2:04 PM

## 2016-03-03 NOTE — Progress Notes (Signed)
Called to see pt for confusion, trying to get out of bed.  Pt not answering questions most of time, though his wife can have him answer to who am I but speech is slurred. He appears confused, not listening to directions he will squeeze by hand with his on instruction, but is moving his head back and forth.  Moving legs equally.  He rec'd IV fentanyl and versed in cath lab.  His speech was slurred on arrival to floor.  Unsure if reaction to meds or ? CVA. Pt will not be still for CT head.  I have asked Dr. Nicole Kindred with Neuro to see pt.

## 2016-03-03 NOTE — Progress Notes (Signed)
ANTICOAGULATION CONSULT NOTE - Follow Up Consult  Pharmacy Consult for Heparin Indication: chest pain/ACS  Allergies  Allergen Reactions  . Docusate Sodium Hives  . Neosporin [Neomycin-Bacitracin Zn-Polymyx] Itching  . Percocet [Oxycodone-Acetaminophen] Nausea And Vomiting  . Adhesive [Tape] Itching, Rash and Other (See Comments)    Also pulls skin if left on too long -Please use paper tape    Patient Measurements: Heparin Dosing Weight: 77.1 kg  Vital Signs: Temp Source: Oral (04/28 0802) BP: 106/54 mmHg (04/28 0802) Pulse Rate: 80 (04/28 0802)  Labs:  Recent Labs  03/02/16 2225 03/03/16 0327 03/03/16 0618  HGB 12.5* 11.8* 12.2*  HCT 36.8* 35.3* 36.3*  PLT 190 176 190  HEPARINUNFRC  --   --  0.39  CREATININE 1.68* 1.67*  1.61*  --   TROPONINI 1.22* 2.69*  --     CrCl cannot be calculated (Unknown ideal weight.).   Medications:  Infusions:  . sodium chloride 75 mL/hr at 03/03/16 0835  . sodium chloride    . heparin 950 Units/hr (03/02/16 2318)    Assessment: 75 year old male on IV heparin for ACS/chest pain. Troponin up at 2.69 this AM. ECHO pending. Plan for cath today.   Initial heparin level is therapeutic. CBC wnl. No bleeding reported.   Goal of Therapy:  Heparin level 0.3-0.7 units/ml Monitor platelets by anticoagulation protocol: Yes   Plan:  Continue heparin at 950 units/hr Recheck heparin level in 8 hours vs follow-up post cath Daily heparin level and CBC while on therapy.   Sloan Leiter, PharmD, BCPS Clinical Pharmacist [phone]  03/03/2016,10:07 AM

## 2016-03-03 NOTE — Progress Notes (Signed)
Echocardiogram 2D Echocardiogram has been performed.  Joelene Millin 03/03/2016, 9:30 AM

## 2016-03-03 NOTE — Progress Notes (Signed)
Pt phypoglycemia this am, 68, 26mL D50 glucose given, bg now 147.

## 2016-03-03 NOTE — Research (Signed)
[place] II Informed Consent   Subject Name: Carlos Wolf  Subject met inclusion and exclusion criteria.  The informed consent form, study requirements and expectations were reviewed with the subject and questions and concerns were addressed prior to the signing of the consent form.  The subject verbalized understanding of the trail requirements.  The subject agreed to participate in the [facility] trial and signed the informed consent.  The informed consent was obtained prior to performance of any protocol-specific procedures for the subject.  A copy of the signed informed consent was given to the subject and a copy was placed in the subject's medical record.  Sandie Ano 03/03/2016, 10:12

## 2016-03-03 NOTE — Progress Notes (Signed)
Pt very restless , agitated ,stated wanting to go to the bathroom,,  disoriented x 4,unable to recognize wife at [facility], , pt safety ensured, reorientation attempted,  Pt attempting to get OOB, instructed on BR,  unable to follow command , Cecilie Kicks notified. Kept monitored.Marland Kitchen

## 2016-03-03 NOTE — Interval H&P Note (Signed)
Cath Lab Visit (complete for each Cath Lab visit)  Clinical Evaluation Leading to the Procedure:   ACS: Yes.    Non-ACS:    Anginal Classification: CCS IV  Anti-ischemic medical therapy: Maximal Therapy (2 or more classes of medications)  Non-Invasive Test Results: No non-invasive testing performed  Prior CABG: Previous CABG      History and Physical Interval Note:  03/03/2016 4:39 PM  Carlos Wolf  has presented today for surgery, with the diagnosis of cp  The various methods of treatment have been discussed with the patient and family. After consideration of risks, benefits and other options for treatment, the patient has consented to  Procedure(s): Left Heart Cath and Cors/Grafts Angiography (N/A) as a surgical intervention .  The patient's history has been reviewed, patient examined, no change in status, stable for surgery.  I have reviewed the patient's chart and labs.  Questions were answered to the patient's satisfaction.     Myrl Lazarus,Lois A

## 2016-03-03 NOTE — Progress Notes (Signed)
Hospital Problem List     Principal Problem:   NSTEMI (non-ST elevated myocardial infarction) (Carlos Wolf) Active Problems:   Diabetes mellitus (Carlos Wolf)   Essential hypertension   GERD (gastroesophageal reflux disease)   Dyslipidemia    Patient Profile:   Primary Cardiologist: Dr. Rosalita Chessman [facility])  75 yo male w/ PMH of CAD (s/p CABG x5 in 2002), Type 2 DM, HTN, HLD, and colon cancer (s/p surgery) who presented to [facility] ED on 03/02/2016 for evaluation of chest pain. Troponin values have been 1.22 and 2.69.  Subjective   Denies any chest pain since being admitted. Diaphoretic this AM. [name] reports he has experienced increased fatigue since 2015. No chest discomfort until yesterday.  Inpatient Medications    . aspirin EC  81 mg Oral QHS  . atorvastatin  80 mg Oral q1800  . cholecalciferol  5,000 Units Oral Daily  . insulin aspart  0-24 Units Subcutaneous Q4H  . metoprolol tartrate  12.5 mg Oral BID  . multivitamin with minerals  1 tablet Oral Daily  . pantoprazole  40 mg Oral Daily  . sodium chloride flush  3 mL Intravenous Q12H    Vital Signs    Filed Vitals:   03/03/16 0230 03/03/16 0300 03/03/16 0500 03/03/16 0600  BP: 108/66 115/62 147/77 136/70  Pulse: 103 103 112 112  Resp: 23 15 24 17   SpO2: 99% 97% 100% 100%   No intake or output data in the 24 hours ending 03/03/16 0724 There were no vitals filed for this visit.  Physical Exam    General: Well developed, well nourished, male appearing in no acute distress. Head: Normocephalic, atraumatic.  Neck: Supple without bruits, JVD. Lungs:  Resp regular and unlabored, CTA without wheezing or rales. Heart: RRR, S1, S2, no S3, S4, or murmur; no rub. Abdomen: Soft, non-tender, non-distended with normoactive bowel sounds. No hepatomegaly. No rebound/guarding. No obvious abdominal masses. Extremities: No clubbing, cyanosis, or edema. Distal pedal pulses are 2+ bilaterally. Neuro: Alert and oriented X 3. Moves  all extremities spontaneously. Psych: Normal affect.  Labs    CBC  Recent Labs  03/02/16 2225 03/03/16 0327 03/03/16 0618  WBC 9.3 9.0 9.0  NEUTROABS 7.6  --   --   HGB 12.5* 11.8* 12.2*  HCT 36.8* 35.3* 36.3*  MCV 82.9 82.5 81.4  PLT 190 176 [id number]   Basic Metabolic Panel  Recent Labs  03/02/16 2225 03/03/16 0327  NA 138 143  142  K 4.1 4.2  4.2  CL 102 106  105  CO2 20* 23  24  GLUCOSE 173* 74  71  BUN 23* 23*  24*  CREATININE 1.68* 1.67*  1.61*  CALCIUM 9.1 9.0  9.1  MG  --  1.4*   Liver Function Tests  Recent Labs  03/02/16 2225 03/03/16 0327  AST 38 42*  ALT 19 20  ALKPHOS 68 66  BILITOT 0.8 0.6  PROT 5.8* 5.4*  ALBUMIN 3.0* 2.8*   No results for input(s): LIPASE, AMYLASE in the last 72 hours. Cardiac Enzymes  Recent Labs  03/02/16 2225 03/03/16 0327  TROPONINI 1.22* 2.69*   BNP Invalid input(s): POCBNP D-Dimer No results for input(s): DDIMER in the last 72 hours. Hemoglobin A1C No results for input(s): HGBA1C in the last 72 hours. Fasting Lipid Panel  Recent Labs  03/03/16 0328  CHOL 78  HDL 32*  LDLCALC 23  TRIG 113  CHOLHDL 2.4   Thyroid Function Tests  Recent Labs  03/03/16 0327  TSH 1.912    ECG    Sinus tachycardia, HR 108. ST abnormality in inferior leads.    Cardiac Studies and Radiology    Dg Chest Port 1 View: 03/02/2016  CLINICAL DATA:  Patient with chest pain from the left side to the right side with associated pressure. EXAM: PORTABLE CHEST 1 VIEW COMPARISON:  Chest radiograph 07/14/2013. FINDINGS: Stable cardiac and mediastinal contours status post median sternotomy and CABG procedure. No consolidative pulmonary opacities. No pleural effusion or pneumothorax. AC joint degenerative changes. IMPRESSION: No acute cardiopulmonary process. Electronically Signed   By: Lovey Newcomer M.D.   On: 03/02/2016 22:29    Echocardiogram: Pending  Assessment & Plan    1. NSTEMI/ CAD - s/p CABG x5 in 2002. Reports  increased fatigue for the past 2 years. Developed a chest pressure while at rest yesterday similar to pain he experienced with initial MI. - cyclic troponin values have been 1.22 and 2.69. - recommend a cardiac catheterization. The patient understands that risks included but are not limited to stroke (1 in 1000), death (1 in 66), kidney failure [usually temporary] (1 in 500), bleeding (1 in 200), allergic reaction [possibly serious] (1 in 200). Him and his wife are in agreement to proceed. Added to the cath schedule for today. - repeat echocardiogram is pending - continue ASA, statin, BB, and Heparin.   2. Type 2 DM - Metformin held in anticipation of cath. Resume 48 hours afterwards. - SSI while admitted.  3. HTN - currently well-controlled. - continue Lopressor 12.5mg  BID.  4. HLD - started on high-intensity statin therapy.  5. Stage 3 CKD  - creatinine 1.67 on admission.  - will hydrate for cath. Continue to monitor closely afterwards.  [name] , PA-C 7:24 AM 03/03/2016 Pager: [phone]  I have personally seen and examined this patient with [name], PA-C I agree with the assessment and plan as outlined above. He is admitted with unstable angina, NSTEMI. Prior CABG. Plans for cardiac cath with probably PCI later today. Pt understands risk and agrees to  proceed. He has known renal insufficiency. Will hydrate this am. Clears for breakfast then NPO.   [name],[name] 03/03/2016 8:26 AM

## 2016-03-03 NOTE — H&P (View-Only) (Signed)
Hospital Problem List     Principal Problem:   NSTEMI (non-ST elevated myocardial infarction) (Carlos Wolf) Active Problems:   Diabetes mellitus ([place])   Essential hypertension   GERD (gastroesophageal reflux disease)   Dyslipidemia    Patient Profile:   Primary Cardiologist: Dr. Rosalita Chessman [facility])  75 yo male w/ PMH of CAD (s/p CABG x5 in 2002), Type 2 DM, HTN, HLD, and colon cancer (s/p surgery) who presented to [facility] ED on 03/02/2016 for evaluation of chest pain. Troponin values have been 1.22 and 2.69.  Subjective   Denies any chest pain since being admitted. Diaphoretic this AM. [name] reports he has experienced increased fatigue since 2015. No chest discomfort until yesterday.  Inpatient Medications    . aspirin EC  81 mg Oral QHS  . atorvastatin  80 mg Oral q1800  . cholecalciferol  5,000 Units Oral Daily  . insulin aspart  0-24 Units Subcutaneous Q4H  . metoprolol tartrate  12.5 mg Oral BID  . multivitamin with minerals  1 tablet Oral Daily  . pantoprazole  40 mg Oral Daily  . sodium chloride flush  3 mL Intravenous Q12H    Vital Signs    Filed Vitals:   03/03/16 0230 03/03/16 0300 03/03/16 0500 03/03/16 0600  BP: 108/66 115/62 147/77 136/70  Pulse: 103 103 112 112  Resp: 23 15 24 17   SpO2: 99% 97% 100% 100%   No intake or output data in the 24 hours ending 03/03/16 0724 There were no vitals filed for this visit.  Physical Exam    General: Well developed, well nourished, male appearing in no acute distress. Head: Normocephalic, atraumatic.  Neck: Supple without bruits, JVD. Lungs:  Resp regular and unlabored, CTA without wheezing or rales. Heart: RRR, S1, S2, no S3, S4, or murmur; no rub. Abdomen: Soft, non-tender, non-distended with normoactive bowel sounds. No hepatomegaly. No rebound/guarding. No obvious abdominal masses. Extremities: No clubbing, cyanosis, or edema. Distal pedal pulses are 2+ bilaterally. Neuro: Alert and oriented X 3. Moves  all extremities spontaneously. Psych: Normal affect.  Labs    CBC  Recent Labs  03/02/16 2225 03/03/16 0327 03/03/16 0618  WBC 9.3 9.0 9.0  NEUTROABS 7.6  --   --   HGB 12.5* 11.8* 12.2*  HCT 36.8* 35.3* 36.3*  MCV 82.9 82.5 81.4  PLT 190 176 99991111   Basic Metabolic Panel  Recent Labs  03/02/16 2225 03/03/16 0327  NA 138 143  142  K 4.1 4.2  4.2  CL 102 106  105  CO2 20* 23  24  GLUCOSE 173* 74  71  BUN 23* 23*  24*  CREATININE 1.68* 1.67*  1.61*  CALCIUM 9.1 9.0  9.1  MG  --  1.4*   Liver Function Tests  Recent Labs  03/02/16 2225 03/03/16 0327  AST 38 42*  ALT 19 20  ALKPHOS 68 66  BILITOT 0.8 0.6  PROT 5.8* 5.4*  ALBUMIN 3.0* 2.8*   No results for input(s): LIPASE, AMYLASE in the last 72 hours. Cardiac Enzymes  Recent Labs  03/02/16 2225 03/03/16 0327  TROPONINI 1.22* 2.69*   BNP Invalid input(s): POCBNP D-Dimer No results for input(s): DDIMER in the last 72 hours. Hemoglobin A1C No results for input(s): HGBA1C in the last 72 hours. Fasting Lipid Panel  Recent Labs  03/03/16 0328  CHOL 78  HDL 32*  LDLCALC 23  TRIG 113  CHOLHDL 2.4   Thyroid Function Tests  Recent Labs  03/03/16 0327  TSH 1.912    ECG    Sinus tachycardia, HR 108. ST abnormality in inferior leads.    Cardiac Studies and Radiology    Dg Chest Port 1 View: 03/02/2016  CLINICAL DATA:  Patient with chest pain from the left side to the right side with associated pressure. EXAM: PORTABLE CHEST 1 VIEW COMPARISON:  Chest radiograph 07/14/2013. FINDINGS: Stable cardiac and mediastinal contours status post median sternotomy and CABG procedure. No consolidative pulmonary opacities. No pleural effusion or pneumothorax. AC joint degenerative changes. IMPRESSION: No acute cardiopulmonary process. Electronically Signed   By: Lovey Newcomer M.D.   On: 03/02/2016 22:29    Echocardiogram: Pending  Assessment & Plan    1. NSTEMI/ CAD - s/p CABG x5 in 2002. Reports  increased fatigue for the past 2 years. Developed a chest pressure while at rest yesterday similar to pain he experienced with initial MI. - cyclic troponin values have been 1.22 and 2.69. - recommend a cardiac catheterization. The patient understands that risks included but are not limited to stroke (1 in 1000), death (1 in 31), kidney failure [usually temporary] (1 in 500), bleeding (1 in 200), allergic reaction [possibly serious] (1 in 200). Him and his wife are in agreement to proceed. Added to the cath schedule for today. - repeat echocardiogram is pending - continue ASA, statin, BB, and Heparin.   2. Type 2 DM - Metformin held in anticipation of cath. Resume 48 hours afterwards. - SSI while admitted.  3. HTN - currently well-controlled. - continue Lopressor 12.5mg  BID.  4. HLD - started on high-intensity statin therapy.  5. Stage 3 CKD  - creatinine 1.67 on admission.  - will hydrate for cath. Continue to monitor closely afterwards.  [name] , PA-C 7:24 AM 03/03/2016 Pager: [phone]  I have personally seen and examined this patient with [name], PA-C I agree with the assessment and plan as outlined above. He is admitted with unstable angina, NSTEMI. Prior CABG. Plans for cardiac cath with probably PCI later today. Pt understands risk and agrees to  proceed. He has known renal insufficiency. Will hydrate this am. Clears for breakfast then NPO.   [name],[name] 03/03/2016 8:26 AM

## 2016-03-04 ENCOUNTER — Inpatient Hospital Stay (HOSPITAL_COMMUNITY): Payer: Medicare Other

## 2016-03-04 DIAGNOSIS — E785 Hyperlipidemia, unspecified: Secondary | ICD-10-CM

## 2016-03-04 DIAGNOSIS — I10 Essential (primary) hypertension: Secondary | ICD-10-CM

## 2016-03-04 LAB — GLUCOSE, CAPILLARY
GLUCOSE-CAPILLARY: 111 mg/dL — AB (ref 65–99)
Glucose-Capillary: 128 mg/dL — ABNORMAL HIGH (ref 65–99)
Glucose-Capillary: 147 mg/dL — ABNORMAL HIGH (ref 65–99)
Glucose-Capillary: 161 mg/dL — ABNORMAL HIGH (ref 65–99)
Glucose-Capillary: 161 mg/dL — ABNORMAL HIGH (ref 65–99)
Glucose-Capillary: 162 mg/dL — ABNORMAL HIGH (ref 65–99)

## 2016-03-04 LAB — BASIC METABOLIC PANEL
Anion gap: 10 (ref 5–15)
BUN: 17 mg/dL (ref 6–20)
CHLORIDE: 108 mmol/L (ref 101–111)
CO2: 24 mmol/L (ref 22–32)
CREATININE: 1.49 mg/dL — AB (ref 0.61–1.24)
Calcium: 8.4 mg/dL — ABNORMAL LOW (ref 8.9–10.3)
GFR, EST AFRICAN AMERICAN: 51 mL/min — AB (ref >60)
GFR, EST NON AFRICAN AMERICAN: 44 mL/min — AB (ref >60)
Glucose, Bld: 153 mg/dL — ABNORMAL HIGH (ref 65–99)
POTASSIUM: 4 mmol/L (ref 3.5–5.1)
SODIUM: 142 mmol/L (ref 135–145)

## 2016-03-04 LAB — HEMOGLOBIN A1C
HEMOGLOBIN A1C: 8.4 % — AB (ref 4.8–5.6)
Mean Plasma Glucose: 194 mg/dL

## 2016-03-04 LAB — CBC
HCT: 34.5 % — ABNORMAL LOW (ref 39.0–52.0)
Hemoglobin: 11.2 g/dL — ABNORMAL LOW (ref 13.0–17.0)
MCH: 27.3 pg (ref 26.0–34.0)
MCHC: 32.5 g/dL (ref 30.0–36.0)
MCV: 84.1 fL (ref 78.0–100.0)
PLATELETS: 174 10*3/uL (ref 150–400)
RBC: 4.1 MIL/uL — AB (ref 4.22–5.81)
RDW: 16.3 % — AB (ref 11.5–15.5)
WBC: 8.3 10*3/uL (ref 4.0–10.5)

## 2016-03-04 LAB — TROPONIN I
TROPONIN I: 14.26 ng/mL — AB (ref <0.031)
TROPONIN I: 13.78 ng/mL — AB (ref <0.031)
Troponin I: 6.02 ng/mL (ref <0.031)

## 2016-03-04 MED ORDER — CLOPIDOGREL BISULFATE 75 MG PO TABS
75.0000 mg | ORAL_TABLET | Freq: Every day | ORAL | Status: DC
  Administered 2016-03-04 – 2016-03-09 (×6): 75 mg via ORAL
  Filled 2016-03-04 (×8): qty 1

## 2016-03-04 MED ORDER — LORAZEPAM 2 MG/ML IJ SOLN
1.0000 mg | Freq: Once | INTRAMUSCULAR | Status: AC
  Administered 2016-03-04: 10:00:00 1 mg via INTRAVENOUS

## 2016-03-04 NOTE — Progress Notes (Signed)
Bedside EEG completed, results pending. 

## 2016-03-04 NOTE — Progress Notes (Signed)
Patient Name: Carlos Wolf Date of Encounter: 03/04/2016  Principal Problem:   NSTEMI (non-ST elevated myocardial infarction) [facility]) Active Problems:   Diabetes mellitus ([place])   Essential hypertension   GERD (gastroesophageal reflux disease)   Dyslipidemia   Unstable angina [name])   Primary Cardiologist: [name] Patient Profile: CAD s/p CABG 2002 at [facility], T2DM, dyslipidemia, hypertension, colon cancer s/p surgery 01/2014, admitted 04/27 with chest discomfort>>cath w/ med rx, results below.  SUBJECTIVE: Responds to verbal, answers simple questions appropriately.  OBJECTIVE Filed Vitals:   03/03/16 1845 03/03/16 1942 03/03/16 2000 03/04/16 0410  BP: 150/46 183/96 148/65 136/73  Pulse: 62 117 108 97  Temp: 98.1 F (36.7 C) 97.9 F (36.6 C) 98.5 F (36.9 C) 100.3 F (37.9 C)  TempSrc:  Axillary Oral Oral  Resp: 14 28 24 29   Height:      Weight:    172 lb 2.9 oz (78.1 kg)  SpO2: 100% 96% 97% 99%    Intake/Output Summary (Last 24 hours) at 03/04/16 0858 Last data filed at 03/04/16 0000  Gross per 24 hour  Intake  512.5 ml  Output      0 ml  Net  512.5 ml   Filed Weights   03/03/16 1400 03/04/16 0410  Weight: 169 lb 11.2 oz (76.975 kg) 172 lb 2.9 oz (78.1 kg)    PHYSICAL EXAM General: Well developed, well nourished, male in no acute distress. Head: Normocephalic, atraumatic.  Neck: Supple without bruits, JVD not elevated Lungs:  Resp regular and unlabored, CTA. Heart: RRR, S1, S2, no S3, S4, or murmur; no rub. Abdomen: Soft, non-tender, non-distended, BS + x 4.  Extremities: No clubbing, cyanosis, edema.  Neuro: Alert and oriented X 3. Moves all extremities spontaneously. Psych: Normal affect.  LABS: CBC: Recent Labs  03/02/16 2225  03/03/16 1941 03/04/16 0545  WBC 9.3  < > 9.1 8.3  NEUTROABS 7.6  --   --   --   HGB 12.5*  < > 12.6* 11.2*  HCT 36.8*  < > 39.3 34.5*  MCV 82.9  < > 83.6 84.1  PLT 190  < > 192 174  < > = values in this interval not  displayed. INR: Recent Labs  03/03/16 1056  INR [id number]   Basic Metabolic Panel: Recent Labs  03/03/16 0327 03/03/16 1941 03/04/16 0545  NA 143  142  --  142  K 4.2  4.2  --  4.0  CL 106  105  --  108  CO2 23  24  --  24  GLUCOSE 74  71  --  153*  BUN 23*  24*  --  17  CREATININE 1.67*  1.61* 1.66* 1.49*  CALCIUM 9.0  9.1  --  8.4*  MG 1.4*  --   --    Liver Function Tests: Recent Labs  03/02/16 2225 03/03/16 0327  AST 38 42*  ALT 19 20  ALKPHOS 68 66  BILITOT 0.8 0.6  PROT 5.8* 5.4*  ALBUMIN 3.0* 2.8*   Cardiac Enzymes: Recent Labs  03/03/16 0327 03/03/16 0918 03/03/16 1539  TROPONINI 2.69* 5.67* 8.94*   BNP:  B NATRIURETIC PEPTIDE  Date/Time Value Ref Range Status  03/03/2016 03:43 AM 866.7* 0.0 - 100.0 pg/mL Final   Hemoglobin A1C: Recent Labs  03/03/16 0327  HGBA1C 8.4*   Fasting Lipid Panel: Recent Labs  03/03/16 0328  CHOL 78  HDL 32*  LDLCALC 23  TRIG 113  CHOLHDL 2.4   Thyroid  Function Tests: Recent Labs  03/03/16 0327  TSH 1.912    TELE:        Cath: 04/28   Ost RCA to Prox RCA lesion, 90% stenosed.  Dist RCA lesion, 90% stenosed.  Ost RPDA lesion, 90% stenosed.  RPDA lesion, 100% stenosed.  SVG .  Prox Graft to Mid Graft lesion, 100% stenosed.  Ost LAD lesion, 100% stenosed.  SVG .  LIMA .  Ost Cx lesion, 100% stenosed.  SVG . Severe native CAD with total occlusion of the very proximal LAD and left circumflex coronary arteries, and small RCA with 90% proximal stenosis with bifurcation 90% stenoses in RV marginal branch with probable total occlusion distally. Patent LIMA graft to the mid LAD. Patent SVG to the diagonal 1 vessel. Patent SVG to the OM1 vessel. There did not appear to be a sequential limb to this. However, there was filling of the distal circumflex and distal marginal branches from the OM1 vessel proximal to the graft anastomosis down the AV groove circumflex. Atretic and probable recent  occlusion of a small SVG which had supplied the PDA. RECOMMENDATION: Medical therapy  Radiology/Studies: Ct Head Wo Contrast  03/04/2016  CLINICAL DATA:  Altered mental status, agitation. Assess cerebellar stroke. History of hypertension, diabetes, hypercholesterolemia and colon cancer. EXAM: CT HEAD WITHOUT CONTRAST TECHNIQUE: Contiguous axial images were obtained from the base of the skull through the vertex without intravenous contrast. COMPARISON:  MRI of the brain May 24, 2015 FINDINGS: INTRACRANIAL CONTENTS: The ventricles and sulci are normal for age. No intraparenchymal hemorrhage, mass effect nor midline shift. Confluent supratentorial and pontine white matter hypodensities. Old RIGHT basal ganglia and LEFT thalamus lacunar infarcts. No acute large vascular territory infarcts. No abnormal extra-axial fluid collections. Basal cisterns are patent. Moderate calcific atherosclerosis of the carotid siphons. ORBITS: The included ocular globes and orbital contents are non-suspicious. Status post RIGHT ocular lens implant. SINUSES: Mild paranasal sinus mucosal thickening without air-fluid levels. Mastoid air cells are well aerated. Soft tissue within RIGHT external auditory canal most compatible with cerumen. SKULL/SOFT TISSUES: No skull fracture. No significant soft tissue swelling. IMPRESSION: No acute intracranial process. 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 Electronically Signed   By: Elon Alas M.D.   On: 03/04/2016 00:50   Dg Chest Port 1 View  03/02/2016  CLINICAL DATA:  Patient with chest pain from the left side to the right side with associated pressure. EXAM: PORTABLE CHEST 1 VIEW COMPARISON:  Chest radiograph 07/14/2013. FINDINGS: Stable cardiac and mediastinal contours status post median sternotomy and CABG procedure. No consolidative pulmonary opacities. No pleural effusion or  pneumothorax. AC joint degenerative changes. IMPRESSION: No acute cardiopulmonary process. Electronically Signed   By: Lovey Newcomer M.D.   On: 03/02/2016 22:29     Current Medications:  . aspirin EC  81 mg Oral QHS  . atorvastatin  80 mg Oral q1800  . cholecalciferol  5,000 Units Oral Daily  . enoxaparin (LOVENOX) injection  40 mg Subcutaneous Q24H  . insulin aspart  0-24 Units Subcutaneous Q4H  . isosorbide mononitrate  30 mg Oral Daily  . metoprolol tartrate  12.5 mg Oral BID  . multivitamin with minerals  1 tablet Oral Daily  . pantoprazole  40 mg Oral Daily  . sodium chloride flush  3 mL Intravenous Q12H  . sodium chloride flush  3 mL Intravenous Q12H   . sodium chloride Stopped (03/04/16 0000)  ASSESSMENT AND PLAN: Principal Problem:   NSTEMI (non-ST elevated myocardial infarction) ([place]) - s/p cath w/ med rx - on ASA, high-dose statin, Imdur, BB - add Plavix - continue current therapy - pt not able to participate in cardiac rehab at this time    Possible CVA - Neuro seeing - old infarcts on CT - MRI/EEG pending - tx to telemetry and keep in hospital  Otherwise, continue therapy. Active Problems:   Diabetes mellitus ([place])   Essential hypertension   GERD (gastroesophageal reflux disease)   Dyslipidemia   Unstable angina ([facility])   Signed, Rosaria Ferries , PA-C 8:58 AM 03/04/2016

## 2016-03-04 NOTE — Progress Notes (Signed)
Patient refused CPAP.  Patient aware to ask RN to call Respiratory if he changes his mind 

## 2016-03-04 NOTE — Progress Notes (Signed)
CARDIAC REHAB PHASE I   Discussed pt status with cardiology PA, will hold ambulation/education at this time due to AMS. Will follow-up Monday.   Lenna Sciara, RN, BSN 03/04/2016 9:01 AM

## 2016-03-04 NOTE — Progress Notes (Addendum)
Subjective: Somewhat improved, but got ativan just prior to MRI and that has left him confused again.   Exam: Filed Vitals:   03/04/16 0736 03/04/16 1230  BP: 122/48 119/66  Pulse: 89 81  Temp: 97.8 F (36.6 C) 98.4 F (36.9 C)  Resp: 19 18   Gen: In bed, NAD Resp: non-labored breathing, no acute distress   Neuro: MS: agitated, getting EEG performed so exam was limited but he is moving all 4 extremities spontaneously.   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   Recommendations: 1) He is on antiplatelet therapy per cardiology.  2) will need PT, OT 3) would consider ct angio head and neck if creatinine is stable/improved.  4) stroke team to follow.   Roland Rack, MD [facility] [phone]  If 7pm- 7am, please page neurology on call as listed in [place].

## 2016-03-05 DIAGNOSIS — R410 Disorientation, unspecified: Secondary | ICD-10-CM

## 2016-03-05 DIAGNOSIS — R4182 Altered mental status, unspecified: Secondary | ICD-10-CM | POA: Insufficient documentation

## 2016-03-05 DIAGNOSIS — I63519 Cerebral infarction due to unspecified occlusion or stenosis of unspecified middle cerebral artery: Secondary | ICD-10-CM

## 2016-03-05 DIAGNOSIS — I639 Cerebral infarction, unspecified: Secondary | ICD-10-CM | POA: Insufficient documentation

## 2016-03-05 LAB — GLUCOSE, CAPILLARY
GLUCOSE-CAPILLARY: 138 mg/dL — AB (ref 65–99)
GLUCOSE-CAPILLARY: 143 mg/dL — AB (ref 65–99)
GLUCOSE-CAPILLARY: 222 mg/dL — AB (ref 65–99)
Glucose-Capillary: 171 mg/dL — ABNORMAL HIGH (ref 65–99)
Glucose-Capillary: 194 mg/dL — ABNORMAL HIGH (ref 65–99)
Glucose-Capillary: 199 mg/dL — ABNORMAL HIGH (ref 65–99)

## 2016-03-05 LAB — CBC
HCT: 32 % — ABNORMAL LOW (ref 39.0–52.0)
Hemoglobin: 10.9 g/dL — ABNORMAL LOW (ref 13.0–17.0)
MCH: 28.8 pg (ref 26.0–34.0)
MCHC: 34.1 g/dL (ref 30.0–36.0)
MCV: 84.4 fL (ref 78.0–100.0)
PLATELETS: 206 10*3/uL (ref 150–400)
RBC: 3.79 MIL/uL — AB (ref 4.22–5.81)
RDW: 16.4 % — AB (ref 11.5–15.5)
WBC: 10.7 10*3/uL — AB (ref 4.0–10.5)

## 2016-03-05 LAB — BASIC METABOLIC PANEL
Anion gap: 12 (ref 5–15)
BUN: 25 mg/dL — AB (ref 6–20)
CALCIUM: 8.2 mg/dL — AB (ref 8.9–10.3)
CHLORIDE: 100 mmol/L — AB (ref 101–111)
CO2: 24 mmol/L (ref 22–32)
CREATININE: 2.03 mg/dL — AB (ref 0.61–1.24)
GFR calc non Af Amer: 30 mL/min — ABNORMAL LOW (ref >60)
GFR, EST AFRICAN AMERICAN: 35 mL/min — AB (ref >60)
Glucose, Bld: 230 mg/dL — ABNORMAL HIGH (ref 65–99)
Potassium: 4 mmol/L (ref 3.5–5.1)
SODIUM: 136 mmol/L (ref 135–145)

## 2016-03-05 LAB — TROPONIN I
TROPONIN I: 25.99 ng/mL — AB (ref <0.031)
Troponin I: 19.11 ng/mL (ref <0.031)
Troponin I: 20.06 ng/mL (ref <0.031)

## 2016-03-05 MED ORDER — ATROPINE SULFATE 1 MG/10ML IJ SOSY: 0.5000 mg | PREFILLED_SYRINGE | INTRAMUSCULAR | Status: DC | PRN

## 2016-03-05 NOTE — Procedures (Signed)
History: 75 yo M with altered mental status following a cath.   Sedation: None  Technique: This is a 21 channel routine scalp EEG performed at the bedside with bipolar and monopolar montages arranged in accordance to the international 10/20 system of electrode placement. One channel was dedicated to EKG recording.    Background: The background consists of intermixed alpha and beta activities. There is a well defined posterior dominant rhythm of 9 Hz. Sleep is recorded with normal appearing structures. There is occasional poorly localized irregular delta that is present in the right hemisphere.   Photic stimulation: Physiologic driving is not performed  EEG Abnormalities: 1) Mild irregular delta in the right hemisphere with a wide field.   Clinical Interpretation: This EEG is suggestive of a right hemispheric focal cerebral dysfunction. There was no seizure or seizure predisposition recorded on this study. Please note that a normal EEG does not preclude the possibility of epilepsy.   Carlos Rack, MD [facility] [phone]  If 7pm- 7am, please page neurology on call as listed in [place].

## 2016-03-05 NOTE — Progress Notes (Signed)
STROKE TEAM PROGRESS NOTE   HISTORY OF PRESENT ILLNESS Carlos Wolf is an 75 y.o. male history diabetes mellitus, hypertension, hyperlipidemia and coronary artery disease, who underwent cardiac catheterization earlier today and was noted to have slurred speech and confusion as well as visual inattention on returning to his hospital room. He attempted to get out of bed multiple times and did not seem to realize that he was attached to his IV. He has required limb restraints. He said previous cardiac catheterizations and has had no known reaction to occasions. He was given Versed and fentanyl for the catheterization procedure. No focal weakness has been noted. His wife does not think that he has a facial droop. His speech has improved. Visual problems have persisted. CT scan of his head is pending. He is currently on aspirin, and is on Lovenox for DVT prevention.   SUBJECTIVE (INTERVAL HISTORY) The patient's wife was at the bedside. The patient was able to follow minimal commands. He was able to move all extremities independently. The patient remains confused and restless. Dr. Leonie Man reassured the patient's wife that the patient would gradually show some improvement.   OBJECTIVE Temp:  [98 F (36.7 C)-98.9 F (37.2 C)] 98.9 F (37.2 C) (04/30 0544) Pulse Rate:  [61-95] 61 (04/30 0544) Cardiac Rhythm:  [-] Heart block (04/29 2140) Resp:  [16-18] 16 (04/30 0544) BP: (96-119)/(51-66) 96/54 mmHg (04/30 0544) SpO2:  [96 %-100 %] 96 % (04/30 0544) Weight:  [76.567 kg (168 lb 12.8 oz)-78.4 kg (172 lb 13.5 oz)] 76.567 kg (168 lb 12.8 oz) (04/30 0544)  CBC:  Recent Labs Lab 03/02/16 2225  03/04/16 0545 03/05/16 0250  WBC 9.3  < > 8.3 10.7*  NEUTROABS 7.6  --   --   --   HGB 12.5*  < > 11.2* 10.9*  HCT 36.8*  < > 34.5* 32.0*  MCV 82.9  < > 84.1 84.4  PLT 190  < > 174 206  < > = values in this interval not displayed.  Basic Metabolic Panel:  Recent Labs Lab 03/03/16 0327 03/03/16 1941  03/04/16 0545  NA 143  142  --  142  K 4.2  4.2  --  4.0  CL 106  105  --  108  CO2 23  24  --  24  GLUCOSE 74  71  --  153*  BUN 23*  24*  --  17  CREATININE 1.67*  1.61* 1.66* 1.49*  CALCIUM 9.0  9.1  --  8.4*  MG 1.4*  --   --     Lipid Panel:    Component Value Date/Time   CHOL 78 03/03/2016 0328   TRIG 113 03/03/2016 0328   HDL 32* 03/03/2016 0328   CHOLHDL 2.4 03/03/2016 0328   VLDL 23 03/03/2016 0328   LDLCALC 23 03/03/2016 0328   HgbA1c:  Lab Results  Component Value Date   HGBA1C 8.4* 03/03/2016   Urine Drug Screen: No results found for: LABOPIA, COCAINSCRNUR, LABBENZ, AMPHETMU, THCU, LABBARB    IMAGING  Ct Head Wo Contrast 03/04/2016   No acute intracranial process. 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    Mr Brain Wo Contrast 03/04/2016   Diffusion only MRI demonstrate small acute posterior circulation infarcts in the right occipital lobe, corpus callosum, and pons.     PHYSICAL EXAM  Obese elderly Caucasian male not in distress. . Afebrile. Head is nontraumatic. Neck is  supple without bruit.    Cardiac exam no murmur or gallop. Lungs are clear to auscultation. Distal pulses are well felt.  Neurological Exam ;  Awake  Alert oriented x 2.Diminished attention, registration and recall. Normal speech and language.eye movements full without nystagmus.fundi were not visualized. Vision acuity and fields appear normal. Hearing is normal. Palatal movements are normal. Face symmetric. Tongue midline. Normal strength, tone, reflexes and coordination. Normal sensation. Gait deferred.    ASSESSMENT/PLAN [name] is a 75 y.o. male with history of hypertension, coronary artery disease, hyperlipidemia, diabetes mellitus, previous occult GI bleeding, and history of colon cancer  presenting with confusion and slurred speech following cardiac catheterization.  He did not receive IV t-PA due to recent arterial access.  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  Resultant  confusion and slurred speech.  MRI  - small acute posterior circulation infarcts in the right occipital lobe, corpus callosum, and pons.   MRA - not performed  Carotid Doppler - not performed  2D Echo - EF 40-45%. No cardiac source of emboli identified.  LDL - 23  HgbA1c 8.4  VTE prophylaxis - Lovenox  Diet heart healthy/carb modified Room service appropriate?: Yes; Fluid consistency:: Thin  aspirin 81 mg daily prior to admission, now on aspirin 81 mg daily and clopidogrel 75 mg daily  Patient counseled to be compliant with his antithrombotic medications  Ongoing aggressive stroke risk factor management  Therapy recommendations:  Pending  Disposition: Pending  Hypertension  Blood pressure tends to run low  Permissive hypertension (OK if < 220/120) but gradually normalize in 5-7 days  Hyperlipidemia  Home meds:  Zocor 40 mg daily changed to Lipitor 80 mg daily  LDL 23, goal < 70  Continue statin at discharge  Diabetes  HgbA1c 8.4, goal < 7.0  Uncontrolled  Other Stroke Risk Factors  Advanced age  Hx stroke/TIA by CT  Family hx stroke (mother)  Coronary artery disease   Other Active Problems  Mild anemia  Mild leukocytosis  Mildly elevated creatinine  Low calcium  Mildly low magnesium  Hospital day # 3 I have personally examined this patient, reviewed notes, independently viewed imaging studies, participated in medical decision making and plan of care. I have made any additions or clarifications directly to the above note. He has post cardiac catherization small infarcts and expect gradual improvement. D/W wife and answered questions.  Antony Contras, MD Medical Director [place] Pager: [phone] 03/05/2016 9:49 PM     To contact Stroke Continuity provider, please refer to  [email or web address] After hours, contact General Neurology

## 2016-03-05 NOTE — Progress Notes (Signed)
Carlos Wolf, [name] made aware of increasing trop level. Pt is not complaining of any discomfort.

## 2016-03-05 NOTE — Progress Notes (Signed)
Pt placed on 4lpm [place] due to sats at 85%  RT will monitor.    Pt family suggesting using o2 tonight rather than cpap because pt is pulling it off each time he wears it.

## 2016-03-05 NOTE — Progress Notes (Addendum)
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 Patient confused after post cath stroke. Doubt he would be a candidate for any intervention. EKG showed 3rd degree heart block with HR 69. Surprisingly HR is good. Will discuss with nighttime fellow, Dr. Oran Rein, but would like to monitor for now since HR is good and BP stable.   Hilbert Corrigan PA Pager: [phone]  Discussed with Dr. Wynonia Lawman, will monitor his HR for now, since his HR is good, will not consider pacer at this time. Will order PRN atropine for HR < 40 with instruction to call MD if HR began to dip.  Hilbert Corrigan PA Pager: [phone]   Hold Metoprolol for now  SignedAlmyra Deforest PA Pager: [phone]

## 2016-03-05 NOTE — Progress Notes (Signed)
EKG preformed. MD on call  made aware. Stated he would review.

## 2016-03-05 NOTE — Progress Notes (Signed)
SUBJECTIVE:  Very sleepy  OBJECTIVE:   Vitals:   Filed Vitals:   03/04/16 1230 03/04/16 2149 03/05/16 0018 03/05/16 0544  BP: 119/66 112/51 110/54 96/54  Pulse: 81 95 90 61  Temp: 98.4 F (36.9 C) 98.2 F (36.8 C) 98 F (36.7 C) 98.9 F (37.2 C)  TempSrc: Oral Oral Oral Oral  Resp: 18 16 18 16   Height: 5\' 9"  (1.753 m)     Weight: 172 lb 13.5 oz (78.4 kg)   168 lb 12.8 oz (76.567 kg)  SpO2: 97% 98% 100% 96%   I&O's:   Intake/Output Summary (Last 24 hours) at 03/05/16 1145 Last data filed at 03/05/16 0233  Gross per 24 hour  Intake    720 ml  Output    345 ml  Net    375 ml   TELEMETRY: Reviewed telemetry pt in NSR:     PHYSICAL EXAM General: Well developed, well nourished, in no acute distress but very sleep Head: Eyes PERRLA, No xanthomas.   Normal cephalic and atramatic  Lungs:   Clear bilaterally to auscultation and percussion. Heart:   HRRR S1 S2 Pulses are 2+ & equal. Abdomen: Bowel sounds are positive, abdomen soft and non-tender without masses  Extremities:   No clubbing, cyanosis or edema.  DP +1 Neuro: Alert and oriented X 3. Psych:  Good affect, responds appropriately   LABS: Basic Metabolic Panel:  Recent Labs  03/03/16 0327 03/03/16 1941 03/04/16 0545  NA 143  142  --  142  K 4.2  4.2  --  4.0  CL 106  105  --  108  CO2 23  24  --  24  GLUCOSE 74  71  --  153*  BUN 23*  24*  --  17  CREATININE 1.67*  1.61* 1.66* 1.49*  CALCIUM 9.0  9.1  --  8.4*  MG 1.4*  --   --    Liver Function Tests:  Recent Labs  03/02/16 2225 03/03/16 0327  AST 38 42*  ALT 19 20  ALKPHOS 68 66  BILITOT 0.8 0.6  PROT 5.8* 5.4*  ALBUMIN 3.0* 2.8*   No results for input(s): LIPASE, AMYLASE in the last 72 hours. CBC:  Recent Labs  03/02/16 2225  03/04/16 0545 03/05/16 0250  WBC 9.3  < > 8.3 10.7*  NEUTROABS 7.6  --   --   --   HGB 12.5*  < > 11.2* 10.9*  HCT 36.8*  < > 34.5* 32.0*  MCV 82.9  < > 84.1 84.4  PLT 190  < > 174 206  < > =  values in this interval not displayed. Cardiac Enzymes:  Recent Labs  03/04/16 1810 03/04/16 2055 03/05/16 0851  TROPONINI 14.26* 13.78* 19.11*   BNP: Invalid input(s): POCBNP D-Dimer: No results for input(s): DDIMER in the last 72 hours. Hemoglobin A1C:  Recent Labs  03/03/16 0327  HGBA1C 8.4*   Fasting Lipid Panel:  Recent Labs  03/03/16 0328  CHOL 78  HDL 32*  LDLCALC 23  TRIG 113  CHOLHDL 2.4   Thyroid Function Tests:  Recent Labs  03/03/16 0327  TSH 1.912   Anemia Panel: No results for input(s): VITAMINB12, FOLATE, FERRITIN, TIBC, IRON, RETICCTPCT in the last 72 hours. Coag Panel:   Lab Results  Component Value Date   INR 1.17 03/03/2016    RADIOLOGY: Ct Head Wo Contrast  03/04/2016  CLINICAL DATA:  Altered mental status, agitation. Assess cerebellar stroke. History of hypertension, diabetes, hypercholesterolemia  and colon cancer. EXAM: CT HEAD WITHOUT CONTRAST TECHNIQUE: Contiguous axial images were obtained from the base of the skull through the vertex without intravenous contrast. COMPARISON:  MRI of the brain May 24, 2015 FINDINGS: INTRACRANIAL CONTENTS: The ventricles and sulci are normal for age. No intraparenchymal hemorrhage, mass effect nor midline shift. Confluent supratentorial and pontine white matter hypodensities. Old RIGHT basal ganglia and LEFT thalamus lacunar infarcts. No acute large vascular territory infarcts. No abnormal extra-axial fluid collections. Basal cisterns are patent. Moderate calcific atherosclerosis of the carotid siphons. ORBITS: The included ocular globes and orbital contents are non-suspicious. Status post RIGHT ocular lens implant. SINUSES: Mild paranasal sinus mucosal thickening without air-fluid levels. Mastoid air cells are well aerated. Soft tissue within RIGHT external auditory canal most compatible with cerumen. SKULL/SOFT TISSUES: No skull fracture. No significant soft tissue swelling. IMPRESSION: No acute intracranial  process. 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 Electronically Signed   By: Elon Alas M.D.   On: 03/04/2016 00:50   Mr Brain Wo Contrast  03/04/2016  CLINICAL DATA:  Altered mental status. Slurred speech and confusion as well as visual in attention following cardiac catheterization. EXAM: MRI HEAD WITHOUT CONTRAST TECHNIQUE: Multiplanar, multiecho pulse sequences of the brain and surrounding structures were obtained without intravenous contrast. COMPARISON:  Head CT 03/04/2016 and MRI 05/24/2015 FINDINGS: The examination had to be discontinued prior to completion due to patient's mental status. Only axial diffusion images were obtained. These demonstrate small areas of acute cortical and white matter infarction in the PCA territory involving the right occipital lobe and splenium of the corpus callosum. A tiny acute infarct is also suspected in the posterior pons. Cerebral atrophy and extensive chronic small vessel white matter disease are again seen. There also chronic infarcts in the left greater than right thalami and pons. No midline shift. IMPRESSION: Diffusion only MRI demonstrate small acute posterior circulation infarcts in the right occipital lobe, corpus callosum, and pons. Electronically Signed   By: Logan Bores M.D.   On: 03/04/2016 12:21   Dg Chest Port 1 View  03/02/2016  CLINICAL DATA:  Patient with chest pain from the left side to the right side with associated pressure. EXAM: PORTABLE CHEST 1 VIEW COMPARISON:  Chest radiograph 07/14/2013. FINDINGS: Stable cardiac and mediastinal contours status post median sternotomy and CABG procedure. No consolidative pulmonary opacities. No pleural effusion or pneumothorax. AC joint degenerative changes. IMPRESSION: No acute cardiopulmonary process. Electronically Signed   By: Lovey Newcomer M.D.   On: 03/02/2016 22:29    ASSESSMENT AND PLAN: Principal  Problem:  NSTEMI (non-ST elevated myocardial infarction) ([place]) - s/p cath w/ med rx - on ASA, Plavix, high-dose statin, Imdur, BB - continue current therapy - pt not able to participate in cardiac rehab at this time due to neurologic event   Possible CVA - Neuro seeing - old infarcts on CT - EEG c/w right hemispheric focal cerebral dysfunction - MRI c/w small acute posterior circulation infarcts in the right occipital lobe, corpus callosum and pons.  - PT/OT consults - Continue ASA/Plaivx  Otherwise, continue therapy. Active Problems:  Diabetes mellitus ([place])  Essential hypertension - BP controlled on current regimen  GERD (gastroesophageal reflux disease)  Dyslipidemia - continue statin  Unstable angina ([facility])    Sueanne Margarita, MD  03/05/2016  11:45 AM

## 2016-03-05 NOTE — Progress Notes (Signed)
I reviewed an EKG that was done earlier in a patient who had a stroke following catheterization and he was asymptomatic.  The catheterization showed that he closed a graft to PDA likely resulting in an inferior infarct.  The EKG showed complete heart block with an escape rate of 69 and a wide-complex rhythm either an accelerated idioventricular rhythm or an IV conduction delay.  Review of his vital signs show that his blood pressure has been relatively stable.  I would recommend observation of this But if he has worsening problems were due to be transferred to the intensive care unit.  Kerry Hough. MD [facility] 03/05/2016 8:04 PM

## 2016-03-05 NOTE — Progress Notes (Signed)
Elevated Troponin Isaac Laud, [name] [facility] informed.  Orders placed to cycle troponin.

## 2016-03-06 ENCOUNTER — Encounter (HOSPITAL_COMMUNITY): Payer: Self-pay | Admitting: Cardiovascular Disease

## 2016-03-06 ENCOUNTER — Inpatient Hospital Stay (HOSPITAL_COMMUNITY): Payer: Medicare Other

## 2016-03-06 DIAGNOSIS — R404 Transient alteration of awareness: Secondary | ICD-10-CM

## 2016-03-06 DIAGNOSIS — I639 Cerebral infarction, unspecified: Secondary | ICD-10-CM

## 2016-03-06 DIAGNOSIS — I63039 Cerebral infarction due to thrombosis of unspecified carotid artery: Secondary | ICD-10-CM

## 2016-03-06 LAB — GLUCOSE, CAPILLARY
GLUCOSE-CAPILLARY: 108 mg/dL — AB (ref 65–99)
GLUCOSE-CAPILLARY: 128 mg/dL — AB (ref 65–99)
GLUCOSE-CAPILLARY: 132 mg/dL — AB (ref 65–99)
GLUCOSE-CAPILLARY: 195 mg/dL — AB (ref 65–99)
Glucose-Capillary: 191 mg/dL — ABNORMAL HIGH (ref 65–99)
Glucose-Capillary: 202 mg/dL — ABNORMAL HIGH (ref 65–99)

## 2016-03-06 LAB — CBC
HCT: 29.7 % — ABNORMAL LOW (ref 39.0–52.0)
HEMOGLOBIN: 10 g/dL — AB (ref 13.0–17.0)
MCH: 28.2 pg (ref 26.0–34.0)
MCHC: 33.7 g/dL (ref 30.0–36.0)
MCV: 83.7 fL (ref 78.0–100.0)
PLATELETS: 157 10*3/uL (ref 150–400)
RBC: 3.55 MIL/uL — AB (ref 4.22–5.81)
RDW: 16.2 % — ABNORMAL HIGH (ref 11.5–15.5)
WBC: 9.9 10*3/uL (ref 4.0–10.5)

## 2016-03-06 MED ORDER — INSULIN ASPART 100 UNIT/ML ~~LOC~~ SOLN
0.0000 [IU] | Freq: Three times a day (TID) | SUBCUTANEOUS | Status: DC
  Administered 2016-03-06: 6 [IU] via SUBCUTANEOUS
  Administered 2016-03-07 (×2): 8 [IU] via SUBCUTANEOUS
  Administered 2016-03-07: 4 [IU] via SUBCUTANEOUS
  Administered 2016-03-07: 8 [IU] via SUBCUTANEOUS
  Administered 2016-03-08: 4 [IU] via SUBCUTANEOUS
  Administered 2016-03-08: 10 [IU] via SUBCUTANEOUS
  Administered 2016-03-08: 8 [IU] via SUBCUTANEOUS
  Administered 2016-03-08: 6 [IU] via SUBCUTANEOUS
  Administered 2016-03-09: 8 [IU] via SUBCUTANEOUS
  Administered 2016-03-09: 4 [IU] via SUBCUTANEOUS

## 2016-03-06 NOTE — Progress Notes (Signed)
*  PRELIMINARY RESULTS* Vascular Ultrasound Carotid Duplex (Doppler) has been completed.   There is no obvious evidence of hemodynamically significant internal carotid artery stenosis bilaterally. Vertebral arteries are patent with antegrade flow.  03/06/2016 2:27 PM Maudry Mayhew, RVT, RDCS, RDMS

## 2016-03-06 NOTE — Progress Notes (Signed)
STROKE SERVICE PROGRESS NOTE  SUBJECTIVE : Patient is accompanied by his wife and daughter in the room. His confusion and orientation seemed to have improved. He still has some left-sided vision deficit. OBJECTIVE:   Vitals :  Blood pressure 112/52, pulse 61, temperature 98 F (36.7 C), temperature source Oral, resp. rate 16, height 5\' 9"  (1.753 m), weight 166 lb 8.9 oz (75.55 kg), SpO2 94 %.  Physical exam  .Obese elderly caucasian male Afebrile. Head is nontraumatic. Neck is supple without bruit.    Cardiac exam no murmur or gallop. Lungs are clear to auscultation. Distal pulses are well felt.  Neurological exam :  Awake Alert oriented x 2.Diminished attention, registration and recall. Normal speech and language.eye movements full without nystagmus.fundi were not visualized. Vision acuity and fields appear normal. Hearing is normal. Palatal movements are normal. Face symmetric. Tongue midline. Normal strength, tone, reflexes and coordination. Normal sensation. Gait deferred No results found.  Mr Brain Wo Contrast 03/04/2016  Diffusion only MRI demonstrate small acute posterior circulation infarcts in the right occipital lobe, corpus callosum, and pons 2Decho [id number] : Systolic function was  mildly to moderately reduced. The estimated ejection fraction was  in the range of 40% to 45%. Severe hypokinesis of the  basal-midinferolateral, inferior, and inferoseptal myocardium  ASSESSMENT:  [name] is a 75 y.o. male with history of hypertension, coronary artery disease, hyperlipidemia, diabetes mellitus, previous occult GI bleeding, and history of colon cancer presenting with confusion and slurred speech following cardiac catheterization due to .  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  Vascular Risk Factors:  Diabetes,Hypertension, hyperlipidemia, obesity, coronary artery disease  PLAN:  Check carotid ultrasound  study. Mobilize out of bed. Physical occupational therapy consults. Continue aspirin and Plavix for stroke prevention. Continue ongoing stroke evaluation and aggressive risk factor modification.Discussed with wife and daughter and answered questions.    Antony Contras, MD Medical Director [place] Pager: [phone] 03/06/2016 3:07 PM

## 2016-03-06 NOTE — Evaluation (Addendum)
Physical Therapy Evaluation Patient Details Name: Carlos Wolf MRN: [id number] DOB: 1941/05/21 Today's Date: 03/06/2016   History of Present Illness  75 y.o. 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 .  Clinical Impression  Elevated and upward trending troponin noted, RN stated cardiologist said ok to start PT. Pt admitted with above diagnosis. Pt currently with functional limitations due to the deficits listed below (see PT Problem List). MOD assist for supine to sit, min assist for sit to stand, Mod assist to take a few steps to recliner with RW. Pt not able to independently advance LLE with ambulation. SaO2 94% on RA with activity. Pt tends to neglect left side, he has L visual field cut per OT. Encouraged family to speak to him from the left and place objects he uses to his left.  Pt fatigues quickly, may be CIR candidate if endurance improves.  ST-SNF recommended (family wants [place] area).  Pt will benefit from skilled PT to increase their independence and safety with mobility to allow discharge to the venue listed below.       Follow Up Recommendations SNF    Equipment Recommendations  Wheelchair (measurements PT);Wheelchair cushion (measurements PT)    Recommendations for Other Services OT consult     Precautions / Restrictions Precautions Precautions: Fall Precaution Comments: 5 falls in past year, has been weak since March 2016 with coloscopy      Mobility  Bed Mobility Overal bed mobility: Needs Assistance Bed Mobility: Rolling;Sidelying to Sit Rolling: Max assist Sidelying to sit: Min assist       General bed mobility comments: max verbal and tactile cues to initiate roll and for technique  Transfers Overall transfer level: Needs assistance Equipment used: Rolling walker (2  wheeled) Transfers: Sit to/from Stand Sit to Stand: Min assist         General transfer comment: min A to rise, verbal cues for hand placment  Ambulation/Gait Ambulation/Gait assistance: Mod assist Ambulation Distance (Feet): 3 Feet Assistive device: Rolling walker (2 wheeled) Gait Pattern/deviations: Step-to pattern;Decreased step length - right;Decreased step length - left;Shuffle   Gait velocity interpretation: Below normal speed for age/gender General Gait Details: assist to advance LLE, pt took several pivotal steps to turn to recliner with max verbal and tactile cues for technique, unable to advance LLE without assist, increased time  Stairs            Wheelchair Mobility    Modified Rankin (Stroke Patients Only)       Balance Overall balance assessment: Needs assistance   Sitting balance-Leahy Scale: Fair       Standing balance-Leahy Scale: Poor                               Pertinent Vitals/Pain Pain Assessment: No/denies pain    Home Living Family/patient expects to be discharged to:: Private residence Living Arrangements: Spouse/significant other Available Help at Discharge: Family Type of Home: House Home Access: Stairs to enter   Technical brewer of Steps: 1 Home Layout: Laundry or work area in [place]: Kasandra Knudsen - single point;Cane - quad;Walker - standard;Walker - 2 wheels;Walker - 4 wheels;Bedside commode;Shower seat      Prior Function Level of Independence: Independent  Hand Dominance        Extremity/Trunk Assessment   Upper Extremity Assessment: Defer to OT evaluation           Lower Extremity Assessment: Overall WFL for tasks assessed (4/5 B knee/ankle with manual muscle testing, functionally seems weaker on L (unable to advance LLE with ambulation)). Tends to neglect L side.       Cervical / Trunk Assessment: Normal  Communication      Cognition Arousal/Alertness:  Awake/alert Behavior During Therapy: Flat affect Overall Cognitive Status: Impaired/Different from baseline Area of Impairment: Problem solving;Following commands       Following Commands: Follows one step commands with increased time     Problem Solving: Slow processing;Decreased initiation;Difficulty sequencing;Requires verbal cues      General Comments      Exercises        Assessment/Plan    PT Assessment Patient needs continued PT services  PT Diagnosis Difficulty walking;Generalized weakness   PT Problem List Decreased strength;Decreased mobility;Decreased activity tolerance;Decreased balance;Decreased knowledge of use of DME;Decreased safety awareness;Decreased cognition  PT Treatment Interventions DME instruction;Gait training;Functional mobility training;Therapeutic activities;Patient/family education;Balance training;Therapeutic exercise   PT Goals (Current goals can be found in the Care Plan section) Acute Rehab PT Goals Patient Stated Goal: return to independence with moblity PT Goal Formulation: With patient/family Time For Goal Achievement: 03/20/16 Potential to Achieve Goals: Fair    Frequency Min 4X/week   Barriers to discharge        Co-evaluation               End of Session Equipment Utilized During Treatment: Gait belt Activity Tolerance: Patient limited by fatigue Patient left: in chair;with call bell/phone within reach;with chair alarm set Nurse Communication: Mobility status         Time: [id number] PT Time Calculation (min) (ACUTE ONLY): 32 min   Charges:   PT Evaluation $PT Eval Moderate Complexity: 1 Procedure PT Treatments $Therapeutic Activity: 8-22 mins   PT G Codes:        Philomena Doheny 03/06/2016, 11:48 AM [phone]

## 2016-03-06 NOTE — Care Management Important Message (Signed)
Important Message  Patient Details  Name: Carlos Wolf MRN: [id number] Date of Birth: 1941-09-07   Medicare Important Message Given:  Yes    [name] P [place] 03/06/2016, 1:55 PM

## 2016-03-06 NOTE — Progress Notes (Signed)
Patient Profile: 75 yo man with PMH of CAD s/p CABG 2002 at [facility], T2DM, dyslipidemia, hypertension, colon cancer s/p surgery 01/2014 who presented with chest discomfort. He ruled in for NSTEMI.   Interval History:  LHC 03/03/16 showed 3/4 patent grafts (LIMA-LAD, SVG-Diag1, SVG-OM1). There was an atretic and probable recent occlusion of a small SVG which had supplied the PDA. Medical therapy recommended.    Post cath, patient developed AMS, confusion and slurred speach. Brain MRI 03/04/16 with small acute posterior circulation infarcts in the right occipital lobe, corpus callosum, and pons.   EKG 03/05/16 ~8:00 PM showed complete heart block with an escape rate of 69 and a wide-complex rhythm either an accelerated idioventricular rhythm or an IV conduction delay. Reviewed by Dr. Wynonia Lawman who recommended holing BB, observation and PRN atropine.   Subjective: Patient alert but no conversing much. Wife and niece provide most of the history. They feel is is doing better today. No issues this morning.   Objective: Vital signs in last 24 hours: Temp:  [98 F (36.7 C)-98.6 F (37 C)] 98 F (36.7 C) (05/01 0558) Pulse Rate:  [61-66] 61 (05/01 0558) Resp:  [16-20] 16 (05/01 0558) BP: (105-116)/(48-53) 112/52 mmHg (05/01 0558) SpO2:  [95 %-98 %] 98 % (05/01 0558) Weight:  [166 lb 8.9 oz (75.55 kg)] 166 lb 8.9 oz (75.55 kg) (05/01 0558) Last BM Date: 03/03/16  Intake/Output from previous day: 04/30 0701 - 05/01 0700 In: 480 [P.O.:480] Out: 200 [Urine:200] Intake/Output this shift: Total I/O In: 120 [P.O.:120] Out: -   Medications Current Facility-Administered Medications  Medication Dose Route Frequency Provider Last Rate Last Dose  . 0.9 %  sodium chloride infusion  250 mL Intravenous PRN Troy Sine, MD      . 0.9 %  sodium chloride infusion   Intravenous Continuous Troy Sine, MD   Stopped at 03/04/16 0000  . acetaminophen (TYLENOL) tablet 650 mg  650 mg Oral Q4H PRN Troy Sine, MD      . aspirin EC tablet 81 mg  81 mg Oral QHS Jules Husbands, MD   81 mg at 03/05/16 2243  . atorvastatin (LIPITOR) tablet 80 mg  80 mg Oral q1800 Jules Husbands, MD   80 mg at 03/05/16 1703  . atropine 1 MG/10ML injection 0.5 mg  0.5 mg Intravenous PRN Almyra Deforest, PA      . cholecalciferol (VITAMIN D) tablet 5,000 Units  5,000 Units Oral Daily Jules Husbands, MD   5,000 Units at 03/06/16 1014  . clopidogrel (PLAVIX) tablet 75 mg  75 mg Oral Daily Rhonda G Barrett, PA-C   75 mg at 03/06/16 1015  . enoxaparin (LOVENOX) injection 40 mg  40 mg Subcutaneous Q24H Troy Sine, MD   40 mg at 03/06/16 0819  . insulin aspart (novoLOG) injection 0-24 Units  0-24 Units Subcutaneous Q4H Jules Husbands, MD   2 Units at 03/06/16 0400  . isosorbide mononitrate (IMDUR) 24 hr tablet 30 mg  30 mg Oral Daily Troy Sine, MD   30 mg at 03/06/16 1015  . LORazepam (ATIVAN) injection 1 mg  1 mg Intravenous Q4H PRN Wallie Char   1 mg at 03/04/16 0926  . multivitamin with minerals tablet 1 tablet  1 tablet Oral Daily Jules Husbands, MD   1 tablet at 03/06/16 1014  . nitroGLYCERIN (NITROSTAT) SL tablet 0.4 mg  0.4 mg Sublingual Q5 Min x 3 PRN Jules Husbands, MD      .  ondansetron (ZOFRAN) injection 4 mg  4 mg Intravenous Q6H PRN Jules Husbands, MD      . pantoprazole (PROTONIX) EC tablet 40 mg  40 mg Oral Daily Jules Husbands, MD   40 mg at 03/06/16 1015  . sodium chloride flush (NS) 0.9 % injection 3 mL  3 mL Intravenous Q12H Jules Husbands, MD   3 mL at 03/06/16 1000  . sodium chloride flush (NS) 0.9 % injection 3 mL  3 mL Intravenous PRN Jules Husbands, MD      . sodium chloride flush (NS) 0.9 % injection 3 mL  3 mL Intravenous Q12H Troy Sine, MD   3 mL at 03/05/16 2244  . sodium chloride flush (NS) 0.9 % injection 3 mL  3 mL Intravenous PRN Troy Sine, MD        PE: General appearance: alert, cooperative and no distress Neck: no carotid bruit and no JVD Lungs: clear to auscultation bilaterally Heart: regular rate  and rhythm, S1, S2 normal, no murmur, click, rub or gallop Extremities: no LEE Pulses: 2+ and symmetric Skin: warm and dry Neurologic: alert, s/p CVA  Lab Results:   Recent Labs  03/04/16 0545 03/05/16 0250 03/06/16 0407  WBC 8.3 10.7* 9.9  HGB 11.2* 10.9* 10.0*  HCT 34.5* 32.0* 29.7*  PLT 174 206 157   BMET  Recent Labs  03/03/16 1941 03/04/16 0545 03/05/16 1324  NA  --  142 136  K  --  4.0 4.0  CL  --  108 100*  CO2  --  24 24  GLUCOSE  --  153* 230*  BUN  --  17 25*  CREATININE 1.66* 1.49* 2.03*  CALCIUM  --  8.4* 8.2*   PT/INR  Recent Labs  03/03/16 1056  LABPROT 15.1  INR 1.17   Studies/Results: Brain MRI 03/04/16 COMPARISON: MRI of the brain May 24, 2015  FINDINGS: INTRACRANIAL CONTENTS: The ventricles and sulci are normal for age. No intraparenchymal hemorrhage, mass effect nor midline shift. Confluent supratentorial and pontine white matter hypodensities. Old RIGHT basal ganglia and LEFT thalamus lacunar infarcts. No acute large vascular territory infarcts. No abnormal extra-axial fluid collections. Basal cisterns are patent. Moderate calcific atherosclerosis of the carotid siphons.  ORBITS: The included ocular globes and orbital contents are non-suspicious. Status post RIGHT ocular lens implant.  SINUSES: Mild paranasal sinus mucosal thickening without air-fluid levels. Mastoid air cells are well aerated. Soft tissue within RIGHT external auditory canal most compatible with cerumen.  SKULL/SOFT TISSUES: No skull fracture. No significant soft tissue swelling.  IMPRESSION: No acute intracranial process.  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   Assessment/Plan  Principal Problem:   NSTEMI (non-ST elevated myocardial infarction) ([place]) Active Problems:   Diabetes mellitus ([place])   Essential hypertension   GERD (gastroesophageal reflux  disease)   Dyslipidemia   Unstable angina (HCC)   Stroke (cerebrum) (HCC)   Altered mental state   1. NSTEMI: s/p cath w/ med rx - on ASA, Plavix, high-dose statin, Imdur,  - No BB given HB/ bradycardia - continue current therapy - pt not able to participate in cardiac rehab at this time due to neurologic event  2. CVA:  - MRI c/w small acute posterior circulation infarcts in the right occipital lobe, corpus callosum and pons.  - [place] team following. Continue recs per stroke team. Appreciate their assistance.   3. Transient CHB:  - resolved. Currently sinus  brady/ SR with HR in the upper 50s-low 60s.  - Metoprolol discontinued.  -continue to monitor on telemetry.    LOS: 4 days    Brittainy M. Ladoris Gene 03/06/2016 10:38 AM  The patient has been seen in conjunction with Lyda Jester, PA-C. All aspects of care have been considered and discussed. The patient has been personally interviewed, examined, and all clinical data has been reviewed.   Patient was seen earlier this morning. Long discussion with the patient's wife. Cardiac status is stable although there is some transient A-V conduction system disease related to infarction.  Plan slow progression from cardiac standpoint.  Will likely benefit from PT/OT inpatient service  We'll follow recommendations of the stroke team.

## 2016-03-06 NOTE — Evaluation (Addendum)
Occupational Therapy Evaluation Patient Details Name: Carlos Wolf MRN: [id number] DOB: 1941/04/18 Today's Date: 03/06/2016    History of Present Illness 75 y.o. 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 .   Clinical Impression   Pt with visual deficits as well as bilateral UE strength and coordination issues that interfere with is ability to complete ADL tasks. He was groggy during session and had difficulty following commands for certain tasks and required increased time. Will benefit from continued OT to progress ADL independence and educate on compensatory strategies for his visual deficits as well as provide AE education for feeding/grooming. Will follow.     Follow Up Recommendations  SNF;Supervision/Assistance - 24 hour    Equipment Recommendations  None recommended by OT    Recommendations for Other Services       Precautions / Restrictions Precautions Precautions: Fall Precaution Comments: 5 falls in past year, has been weak since March 2016 with coloscopy; L visual field cut      Mobility Bed Mobility        General bed mobility comments: in chair.   Transfers          General transfer comment: not tested this session.    Balance                               ADL Overall ADL's : Needs assistance/impaired Eating/Feeding: Sitting;Minimal assistance to drink from cup   Grooming: Set up;Supervision/safety;Wash/dry face;Sitting   Upper Body Bathing: Moderate assistance;Sitting                             General ADL Comments: Wife and niece present for session. Pt asleep upon arrival but able to wake up to particpate in session but noted to close eyes at time during session. He was slow to respond to commands and problem solve tasks. He had  difficulty follow commands for MMT but when asked to reach out for a washcloth to wash his face or retrieve a cup, he was able to do so fairly easily. Spoke with nursing and nursing stated that cardiology had stated ok to work with pt for PT/OT. Pt's wife stated that even PTA she had noted a decrease in bilateral UE strength especially his grip strength. Discussed a therapy ball for gripping and also red foam to build up utensils. Encouraged her to let pt try to feed himself at least part of meal and help if needed to help pt improve UE function.      Vision Vision Assessment?: Yes;Vision impaired- to be further tested in functional context Tracking/Visual Pursuits: Other (comment) (pt could not track OT target (finger) in any of visual fields) Additional Comments: When asked to follow therapist's finger in all directions/visual fields, pt unable to track. When asked to locate therapist on his R side, pt did turn head and slowly able to track with both eyes. However, when asked to locate family member on his left, he slightly turned head to the left but did not track eyes to the left. Pt sleepy during session so difficult to fully assess but with briefl visula field testing,  pt appears to have L field cut   Perception     Praxis  Pertinent Vitals/Pain Pain Assessment: No/denies pain     Hand Dominance     Extremity/Trunk Assessment Upper Extremity Assessment Upper Extremity Assessment: RUE deficits/detail;LUE deficits/detail RUE Deficits / Details: overall strength grossly 4/5 shoulder, elbow but difficulty with following commands for MMT. Wrist 3+/5 and grip is fair. Pt did reach out with each UE to retrieve washcloth from therapist with accurate reach for target. RUE Coordination: decreased fine motor;decreased gross motor LUE Deficits / Details: same as above.  LUE Coordination: decreased fine motor;decreased gross motor      Cervical / Trunk Assessment Cervical / Trunk  Assessment: Normal   Communication     Cognition Arousal/Alertness: Lethargic (asleep upon arrival. able to arouse but sleepy/groggy during session) Behavior During Therapy: Flat affect Overall Cognitive Status: Impaired/Different from baseline Area of Impairment: Memory;Following commands;Problem solving     Memory: Decreased short-term memory Following Commands: Follows one step commands with increased time     Problem Solving: Slow processing;Decreased initiation;Difficulty sequencing;Requires verbal cues     General Comments       Exercises       Shoulder Instructions      Home Living Family/patient expects to be discharged to:: Skilled nursing facility Living Arrangements: Spouse/significant other Available Help at Discharge: Family Type of Home: House Home Access: Stairs to enter [employer] of Steps: 1   Home Layout: Laundry or work area in [place]: Kasandra Knudsen - single point;Cane - quad;Walker - standard;Walker - 2 wheels;Walker - 4 wheels;Bedside commode;Shower seat          Prior Functioning/Environment Level of Independence: Independent             OT Diagnosis: Generalized weakness;Cognitive deficits;Disturbance of vision   OT Problem List: Decreased strength;Impaired vision/perception;Decreased cognition;Decreased knowledge of use of DME or AE   OT Treatment/Interventions: Self-care/ADL training;DME and/or AE instruction;Cognitive remediation/compensation;Therapeutic activities;Therapeutic exercise;Visual/perceptual remediation/compensation;Patient/family education    OT Goals(Current goals can be found in the care plan section) Acute Rehab OT Goals Patient Stated Goal: return to independence with moblity OT Goal Formulation: With family Time For Goal Achievement: 03/20/16 Potential to Achieve Goals: Good ADL Goals Pt Will Perform Eating: with set-up;with adaptive utensils;sitting Pt Will Perform  Grooming: with set-up;with adaptive equipment;sitting Pt Will Perform Upper Body Bathing: with supervision;sitting Pt Will Perform Lower Body Bathing: with min assist;sit to/from stand Pt Will Transfer to Toilet: with min assist;bedside commode;stand pivot transfer Pt/caregiver will Perform Home Exercise Program: Both right and left upper extremity;Increased strength (therapy ball) Additional ADL Goal #1: Pt will require at most 2 verbal cues to locate items placed on L side for ADL task.   OT Frequency: Min 3X/week   Barriers to D/C:            Co-evaluation              End of Session    Activity Tolerance: Patient limited by lethargy Patient left: in chair;with call bell/phone within reach;with chair alarm set;with family/visitor present   Time: [id number] OT Time Calculation (min): 28 min Charges:  OT General Charges $OT Visit: 1 Procedure OT Evaluation $OT Eval Moderate Complexity: 1 Procedure OT Treatments $Self Care/Home Management : 8-22 mins G-Codes:    Jules Schick 03/06/2016, 1:30 PM

## 2016-03-07 DIAGNOSIS — I63533 Cerebral infarction due to unspecified occlusion or stenosis of bilateral posterior cerebral arteries: Secondary | ICD-10-CM

## 2016-03-07 DIAGNOSIS — E1159 Type 2 diabetes mellitus with other circulatory complications: Secondary | ICD-10-CM

## 2016-03-07 LAB — GLUCOSE, CAPILLARY
GLUCOSE-CAPILLARY: 202 mg/dL — AB (ref 65–99)
GLUCOSE-CAPILLARY: 258 mg/dL — AB (ref 65–99)
GLUCOSE-CAPILLARY: 256 mg/dL — AB (ref 65–99)
Glucose-Capillary: 180 mg/dL — ABNORMAL HIGH (ref 65–99)
Glucose-Capillary: 286 mg/dL — ABNORMAL HIGH (ref 65–99)

## 2016-03-07 LAB — CBC
HCT: 34.3 % — ABNORMAL LOW (ref 39.0–52.0)
HEMOGLOBIN: 11.3 g/dL — AB (ref 13.0–17.0)
MCH: 27.2 pg (ref 26.0–34.0)
MCHC: 32.9 g/dL (ref 30.0–36.0)
MCV: 82.5 fL (ref 78.0–100.0)
PLATELETS: 141 10*3/uL — AB (ref 150–400)
RBC: 4.16 MIL/uL — ABNORMAL LOW (ref 4.22–5.81)
RDW: 16.2 % — AB (ref 11.5–15.5)
WBC: 8.9 10*3/uL (ref 4.0–10.5)

## 2016-03-07 MED ORDER — ZOLPIDEM TARTRATE 5 MG PO TABS: 2.5000 mg | ORAL_TABLET | Freq: Every evening | ORAL | Status: DC | PRN

## 2016-03-07 NOTE — Clinical Social Work Placement (Signed)
   CLINICAL SOCIAL WORK PLACEMENT  NOTE  Date:  03/07/2016  Patient Details  Name: Carlos Wolf MRN: [id number] Date of Birth: 12-Feb-1941  Clinical Social Work is seeking post-discharge placement for this patient at the [place] level of care (*CSW will initial, date and re-position this form in  chart as items are completed):  Yes   Patient/family provided with [place] Work Department's list of facilities offering this level of care within the geographic area requested by the patient (or if unable, by the patient's family).  Yes   Patient/family informed of their freedom to choose among providers that offer the needed level of care, that participate in Medicare, Medicaid or managed care program needed by the patient, have an available bed and are willing to accept the patient.  Yes   Patient/family informed of 's ownership interest in [facility] and [facility], as well as of the fact that they are under no obligation to receive care at these facilities.  PASRR submitted to EDS on       PASRR number received on       Existing PASRR number confirmed on 03/07/16     FL2 transmitted to all facilities in geographic area requested by pt/family on 03/07/16     FL2 transmitted to all facilities within larger geographic area on 03/07/16     Patient informed that his/her managed care company has contracts with or will negotiate with certain facilities, including the following:            Patient/family informed of bed offers received.  Patient chooses bed at       Physician recommends and patient chooses bed at      Patient to be transferred to   on  .  Patient to be transferred to facility by       Patient family notified on   of transfer.  Name of family member notified:        PHYSICIAN       Additional Comment:    _______________________________________________ Candie Chroman, LCSW 03/07/2016, 11:23 AM

## 2016-03-07 NOTE — Clinical Social Work Note (Signed)
Admissions staff from [facility] [phone]) to ask about discharge date. Staff will contact patient's wife about possible discharge to their facility.  Dayton Scrape, [place]

## 2016-03-07 NOTE — Clinical Social Work Note (Signed)
Clinical Social Work Assessment  Patient Details  Name: Carlos Wolf MRN: [id number] Date of Birth: 06/04/1941  Date of referral:  03/06/16               Reason for consult:  Facility Placement, Discharge Planning                Permission sought to share information with:  Facility Sport and exercise psychologist, Family Supports Permission granted to share information::  Yes, Verbal Permission Granted  Name::     Health and safety inspector::  SNF  Relationship::  Wife  Contact Information:  [phone]  Housing/Transportation Living arrangements for the past 2 months:  Single Family Home Source of Information:  Spouse Patient Interpreter Needed:  None Criminal Activity/Legal Involvement Pertinent to Current Situation/Hospitalization:  No - Comment as needed Significant Relationships:  Adult Children, Spouse Lives with:  Spouse Do you feel safe going back to the place where you live?  Yes Need for family participation in patient care:  Yes (Comment)  Care giving concerns:  PT recommends SNF placement once medically stable for discharge.   Social Worker assessment / plan:  CSW met with patient. Patient was sleeping but wife and two family members were at bedside. CSW introduced role and explained that discharge planning would be discussed. Family aware of SNF recommendation. [name] prefers placement near the [place], [place] area. CSW provided list of SNF's in the [facility] area as well as near [place]. [name] prefers [facility], [facility], [facility], and [facility]. No further concerns. CSW will continue to follow patient and his family for support and will facilitate once medically stable for discharge.  Employment status:  Retired Nurse, adult PT Recommendations:  [place] / Referral to community resources:  [place]  Patient/Family's Response to care:  Patient not fully  oriented or awake. [name] and family agreeable to SNF recommendation. Patient's family involved in patient's care and supportive. Patient's family polite and appreciated social work intervention.  Patient/Family's Understanding of and Emotional Response to Diagnosis, Current Treatment, and Prognosis:  [name] and family knowledgeable of medical interventions and aware of SNF placement once medically stable for discharge.  Emotional Assessment Appearance:  Appears stated age Attitude/Demeanor/Rapport:  Unable to Assess Affect (typically observed):  Unable to Assess Orientation:  Oriented to Self Alcohol / Substance use:  Never Used Psych involvement (Current and /or in the community):  No (Comment)  Discharge Needs  Concerns to be addressed:  Care Coordination Readmission within the last 30 days:  No Current discharge risk:  Cognitively Impaired, Dependent with Mobility Barriers to Discharge:  No Barriers Identified   Candie Chroman, LCSW 03/07/2016, 11:17 AM

## 2016-03-07 NOTE — Progress Notes (Signed)
Patient's HR dropped down in the 30's a few times; nonsustained. Patient is asymptomatic. Vitals WNL. PA/MD paged. Order given not to give atropine at this time. Will notify again if pt becomes symptomatic.

## 2016-03-07 NOTE — NC FL2 (Signed)
[place] LEVEL OF CARE SCREENING TOOL     IDENTIFICATION  Patient Name: Carlos Wolf Birthdate: 10-Mar-1941 Sex: male Admission Date (Current Location): 03/02/2016  [facility] and [place] Number:  Manufacturing engineer and Address:  [facility], [place] [address], [place], [place] [address]      Provider Number: [id number]  Attending Physician Name and Address:  Belva Crome, MD  Relative Name and Phone Number:       Current Level of Care: Hospital Recommended Level of Care: [place] Prior Approval Number:    Date Approved/Denied:   PASRR Number: [id number]  Discharge Plan: SNF    Current Diagnoses: Patient Active Problem List   Diagnosis Date Noted  . Stroke ([place])   . Stroke (cerebrum) ([place])   . Altered mental state   . Unstable angina ([place])   . NSTEMI (non-ST elevated myocardial infarction) ([place]) 03/02/2016  . Dyslipidemia 03/02/2016  . Weakness 01/13/2015  . Multiple falls 01/13/2015  . Physical deconditioning 01/13/2015  . Essential hypertension 01/13/2015  . GERD (gastroesophageal reflux disease) 01/13/2015  . Diabetes mellitus ([place]) 02/04/2013  . Rectal cancer ([place]) 01/16/2013  . Benign neoplasm of colon 01/16/2013  . Guaiac positive stools 11/28/2012  . Melena 11/28/2012  . High cholesterol   . Acid reflux   . Skin cancer     Orientation RESPIRATION BLADDER Height & Weight     Self  Normal Incontinent, External catheter Weight: 172 lb 12.8 oz (78.382 kg) (scale b) Height:  5\' 9"  (175.3 cm)  BEHAVIORAL SYMPTOMS/MOOD NEUROLOGICAL BOWEL NUTRITION STATUS   (None)  (History of stroke) Continent Diet (Heart healthy)  AMBULATORY STATUS COMMUNICATION OF NEEDS Skin   Extensive Assist Verbally Normal                       Personal Care Assistance Level of Assistance  Bathing, Feeding, Dressing Bathing Assistance: Limited assistance Feeding assistance: Independent Dressing Assistance: Limited  assistance     Functional Limitations Info  Sight, Hearing, Speech Sight Info: Adequate Hearing Info: Adequate Speech Info: Adequate    SPECIAL CARE FACTORS FREQUENCY  Blood pressure, Diabetic urine testing, PT (By licensed PT), OT (By licensed OT)     PT Frequency: 5 x week OT Frequency: 5 x week            Contractures Contractures Info: Not present    Additional Factors Info  Code Status, Allergies Code Status Info: Full Allergies Info: Docusate Sodium, Neosporin, Percoset, Adhesive           Current Medications (03/07/2016):  This is the current hospital active medication list Current Facility-Administered Medications  Medication Dose Route Frequency Provider Last Rate Last Dose  . 0.9 %  sodium chloride infusion  250 mL Intravenous PRN Troy Sine, MD      . 0.9 %  sodium chloride infusion   Intravenous Continuous Troy Sine, MD   Stopped at 03/04/16 0000  . acetaminophen (TYLENOL) tablet 650 mg  650 mg Oral Q4H PRN Troy Sine, MD      . aspirin EC tablet 81 mg  81 mg Oral QHS Jules Husbands, MD   81 mg at 03/06/16 2209  . atorvastatin (LIPITOR) tablet 80 mg  80 mg Oral q1800 Jules Husbands, MD   80 mg at 03/06/16 1720  . atropine 1 MG/10ML injection 0.5 mg  0.5 mg Intravenous PRN Almyra Deforest, PA      .  cholecalciferol (VITAMIN D) tablet 5,000 Units  5,000 Units Oral Daily Jules Husbands, MD   5,000 Units at 03/07/16 1016  . clopidogrel (PLAVIX) tablet 75 mg  75 mg Oral Daily Rhonda G Barrett, PA-C   75 mg at 03/07/16 1016  . enoxaparin (LOVENOX) injection 40 mg  40 mg Subcutaneous Q24H Troy Sine, MD   40 mg at 03/07/16 1016  . insulin aspart (novoLOG) injection 0-24 Units  0-24 Units Subcutaneous TID WC & HS Jettie Booze, MD   4 Units at 03/07/16 [phone]  . isosorbide mononitrate (IMDUR) 24 hr tablet 30 mg  30 mg Oral Daily Troy Sine, MD   30 mg at 03/07/16 1016  . LORazepam (ATIVAN) injection 1 mg  1 mg Intravenous Q4H PRN Wallie Char   1 mg at  03/04/16 0926  . multivitamin with minerals tablet 1 tablet  1 tablet Oral Daily Jules Husbands, MD   1 tablet at 03/07/16 1016  . nitroGLYCERIN (NITROSTAT) SL tablet 0.4 mg  0.4 mg Sublingual Q5 Min x 3 PRN Jules Husbands, MD      . ondansetron [facility]) injection 4 mg  4 mg Intravenous Q6H PRN Jules Husbands, MD      . pantoprazole (PROTONIX) EC tablet 40 mg  40 mg Oral Daily Jules Husbands, MD   40 mg at 03/07/16 1016  . sodium chloride flush (NS) 0.9 % injection 3 mL  3 mL Intravenous Q12H Jules Husbands, MD   3 mL at 03/07/16 1021  . sodium chloride flush (NS) 0.9 % injection 3 mL  3 mL Intravenous PRN Jules Husbands, MD      . sodium chloride flush (NS) 0.9 % injection 3 mL  3 mL Intravenous Q12H Troy Sine, MD   3 mL at 03/07/16 1021  . sodium chloride flush (NS) 0.9 % injection 3 mL  3 mL Intravenous PRN Troy Sine, MD      . zolpidem (AMBIEN) tablet 2.5 mg  2.5 mg Oral QHS PRN Alphia Moh, MD         Discharge Medications: Please see discharge summary for a list of discharge medications.  Relevant Imaging Results:  Relevant Lab Results:   Additional Information SS#: [id number]  Candie Chroman, LCSW

## 2016-03-07 NOTE — Progress Notes (Signed)
Rehab Admissions Coordinator Note:  Patient was screened by Cleatrice Burke for appropriateness for an Inpatient Acute Rehab Consult per OT updated assessment today. Previous recommendations for SNF from PT and OT.  At this time, we are recommending Inpatient Rehab consult If family would like to pursue CIR. Noted SW working with family about SNF placement preferences. Please clarify   Cleatrice Burke 03/07/2016, 2:04 PM  I can be reached at [phone]

## 2016-03-07 NOTE — Progress Notes (Addendum)
       Patient Name: Carlos Wolf Date of Encounter: 03/07/2016    SUBJECTIVE: About the same as yesterday. Difficulty with vision. Nurses have become concerned about episodes of bradycardia, however they are asymptomatic.  TELEMETRY:  Sinus rhythm with Mobitz 1 second-degree heart block Filed Vitals:   03/06/16 1141 03/06/16 2044 03/07/16 0508 03/07/16 0924  BP:  108/46 107/48 101/41  Pulse:  60 60 43  Temp:  100.3 F (37.9 C) 99.5 F (37.5 C)   TempSrc:  Oral Oral   Resp:  16 16   Height:      Weight:   172 lb 12.8 oz (78.382 kg)   SpO2: 94% 98% 98%     Intake/Output Summary (Last 24 hours) at 03/07/16 1100 Last data filed at 03/07/16 0920  Gross per 24 hour  Intake    480 ml  Output    200 ml  Net    280 ml   LABS: Basic Metabolic Panel:  Recent Labs  03/05/16 1324  NA 136  K 4.0  CL 100*  CO2 24  GLUCOSE 230*  BUN 25*  CREATININE 2.03*  CALCIUM 8.2*   CBC:  Recent Labs  03/06/16 0407 03/07/16 0407  WBC 9.9 8.9  HGB 10.0* 11.3*  HCT 29.7* 34.3*  MCV 83.7 82.5  PLT 157 141*   Cardiac Enzymes:  Recent Labs  03/05/16 0851 03/05/16 1324 03/05/16 1905  TROPONINI 19.11* 20.06* 25.99*   Radiology/Studies:  No new data  Physical Exam: Blood pressure 101/41, pulse 43, temperature 99.5 F (37.5 C), temperature source Oral, resp. rate 16, height 5\' 9"  (1.753 m), weight 172 lb 12.8 oz (78.382 kg), SpO2 98 %. Weight change: 6 lb 3.9 oz (2.832 kg)  Wt Readings from Last 3 Encounters:  03/07/16 172 lb 12.8 oz (78.382 kg)  01/11/15 170 lb (77.111 kg)  02/10/13 191 lb 0.6 oz (86.655 kg)   It appears that the patient does not focus on objects. He appears to have a right gaze preference. Neck veins are flat Chest is clear Cardiac exam reveals an apical systolic murmur Extremities reveal no edema   ASSESSMENT:  1. Embolic CVA with deficits and being followed by stroke team 2. Coronary disease with completed inferior infarct, complicated by AV  node ischemia and asymptomatic bradycardia 3. 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 Will get EP opinion, but my inclination is further observation since thia occurred in setting of completed inferior MI  Plan:  1. Hold any medications that may slow the heart rate 2. Review carotid Doppler study 3. Repeat EKG - done 3 degree AV block with accelerated ventricular rhythm 4. Review old T and PT recommendations.  Demetrios Isaacs 03/07/2016, 11:00 AM

## 2016-03-07 NOTE — Progress Notes (Signed)
Inpatient Diabetes Program Recommendations  AACE/ADA: New Consensus Statement on Inpatient Glycemic Control (2015)  Target Ranges:  Prepandial:   less than 140 mg/dL      Peak postprandial:   less than 180 mg/dL (1-2 hours)      Critically ill patients:  140 - 180 mg/dL   Review of Glycemic Control  Inpatient Diabetes Program Recommendations:  Insulin - Basal: add low dose Levemir 15 units  Thank you  Raoul Pitch BSN, RN,CDE Inpatient Diabetes Coordinator [phone] (team pager)

## 2016-03-07 NOTE — Consult Note (Signed)
ELECTROPHYSIOLOGY CONSULT NOTE    Patient ID: Carlos Wolf MRN: [id number], DOB/AGE: 1941-08-05 75 y.o.  Admit date: 03/02/2016 Date of Consult: 03/07/2016  Primary Physician: Wende Neighbors, MD Primary Cardiologist: Dr. Harrington Challenger (for pre-op eval in 2014, not since) Referring MD: Dr. Tamala Julian  Reason for Consultation: CHB  HPI: SAHWN Carlos Wolf is a 75 y.o. male admitted 03/02/16 with c/o CP, he has PMHx of CAD/CABG in 2002, DM< HLD, HTN, colon cancer treated surgically.  He was admitted with ACS/NSTEMI. He underwent cath 03/03/16 noted below, no intervention was done.  03/04/16 was noted to be disoriented and found with acute CVA.  He was noted 03/06/16 to have developed CHB with rates 50's-60's, EP is being asked to see him for this.  The patient is feeling well, his wife is at bedside and states he has shown much improvement since his stroke, feeding himself and making sense without ongoing confusion, most noted that he was improving late yesterday to today.  He has not had near syncope or syncope here.  He denies any on going CP, no palpitations no SOB.  Historically he has fainted once having BM with colonoscopy bowel prep.  In the last 2 years he has had gait instability and falls but no near syncope or syncope since the noted event.  He was told one after his colon surgery and prior to a radiation session that his heart beat was irregular, though resolved and no treatment recommended or needed as far as they know.  He has been seeing his cardiologist in [place]., Dr. Rosalita Chessman routinely, and state he has never mentioned to them any kind of heart beat/rhythm or rate issues other then when on metoprolol historically had to stop with slow HR's  Past Medical History  Diagnosis Date  . Hypertension   . High cholesterol   . Diabetes mellitus   . Acid reflux   . Occult GI bleeding     per patient blood in rectum my only problem  . Skin cancer     to nose  . Diabetes mellitus ([place]) 02/04/2013  .  Colon cancer [facility])      Surgical History:  Past Surgical History  Procedure Laterality Date  . Cardiac surgery    . Tonsillectomy    . Skin cancer excision      nose  . 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 Circumcision  10/24/2011    Procedure: CIRCUMCISION ADULT;  Surgeon: Marissa Nestle;  Location: [facility] ORS;  Service: Urology;  Laterality: N/A;  with repair of glans penis  . Cystoscopy  10/24/2011    Procedure: CYSTOSCOPY FLEXIBLE;  Surgeon: Marissa Nestle;  Location: [facility] ORS;  Service: Urology;;  . Esophagogastroduodenoscopy N/A 12/13/2012    Procedure: ESOPHAGOGASTRODUODENOSCOPY (EGD);  Surgeon: Rogene Houston, MD;  Location: [facility] END[facility] SU[facility];  Service: Endoscopy;  Laterality: N/A;  . Flexible sigmoidoscopy N/A 12/13/2012    Procedure: FLEXIBLE SIGMOIDOSCOPY;  Surgeon: Rogene Houston, MD;  Location: [facility] END[facility];  Service: Endoscopy;  Laterality: N/A;  . Colonoscopy N/A 12/27/2012    Procedure: COLONOSCOPY;  Surgeon: Rogene Houston, MD;  Location: [facility] END[facility] SU[facility];  Service: Endoscopy;  Laterality: N/A;  730-rescheduled to 200 Ann to notify pt  . Eus N/A 01/16/2013    Procedure: LOWER ENDOSCOPIC ULTRASOUND (EUS) radial only, 60 minutes, staging of rectal cancer, colonoscopy to follow;  Surgeon: Milus Banister, MD;  Location: [facility] ENDOSCOPY;  Service: Endoscopy;  Laterality: N/A;  . Colonoscopy N/A 01/16/2013    Procedure: COLONOSCOPY;  Surgeon: Milus Banister, MD;  Location: [facility] ENDOSCOPY;  Service: Endoscopy;  Laterality: N/A;  . Cataract extraction Right   . Hemicolectomy    . Cardiac catheterization N/A 03/03/2016    Procedure: Left Heart Cath and Cors/Grafts Angiography;  Surgeon: Troy Sine, MD;  Location: [facility] LAB;  Service: Cardiovascular;  Laterality: N/A;     Prescriptions prior to admission  Medication Sig Dispense Refill Last Dose  . aspirin EC 81 MG tablet Take 81 mg by mouth at bedtime.    03/02/2016 at Unknown time  . Cholecalciferol  (VITAMIN D3) 5000 units TABS Take 5,000 Units by mouth daily.   03/02/2016 at Unknown time  . Insulin Degludec (TRESIBA FLEXTOUCH) 200 UNIT/ML SOPN Inject 25-35 Units into the skin at bedtime. Inject 25 units if CBG <200, 35 units if CBG >200   03/01/2016 at Unknown time  . metFORMIN (GLUCOPHAGE) 850 MG tablet Take 850 mg by mouth 2 (two) times daily with a meal.   03/02/2016 at pm  . Multiple Vitamin (MULTIVITAMIN WITH MINERALS) TABS tablet Take 1 tablet by mouth daily. Centrum   03/02/2016 at Unknown time  . omeprazole (PRILOSEC) 20 MG capsule Take 20 mg by mouth at bedtime.    03/02/2016 at Unknown time  . PRESCRIPTION MEDICATION Inhale into the lungs at bedtime. CPAP   03/01/2016 at Unknown time  . simvastatin (ZOCOR) 40 MG tablet Take 40 mg by mouth at bedtime.    03/02/2016 at Unknown time    Inpatient Medications:  . aspirin EC  81 mg Oral QHS  . atorvastatin  80 mg Oral q1800  . cholecalciferol  5,000 Units Oral Daily  . clopidogrel  75 mg Oral Daily  . enoxaparin (LOVENOX) injection  40 mg Subcutaneous Q24H  . insulin aspart  0-24 Units Subcutaneous TID WC & HS  . isosorbide mononitrate  30 mg Oral Daily  . multivitamin with minerals  1 tablet Oral Daily  . pantoprazole  40 mg Oral Daily  . sodium chloride flush  3 mL Intravenous Q12H  . sodium chloride flush  3 mL Intravenous Q12H    Allergies:  Allergies  Allergen Reactions  . Docusate Sodium Hives  . Neosporin [Neomycin-Bacitracin Zn-Polymyx] Itching  . Percocet [Oxycodone-Acetaminophen] Nausea And Vomiting  . Adhesive [Tape] Itching, Rash and Other (See Comments)    Also pulls skin if left on too long -Please use paper tape    Social History   Social History  . Marital Status: Married    Spouse Name: N/A  . Number of Children: N/A  . Years of Education: N/A   Occupational History  . Not on file.   Social History Main Topics  . Smoking status: Never Smoker   . Smokeless tobacco: Never Used  . Alcohol Use: No  .  Drug Use: No  . Sexual Activity: Yes   Other Topics Concern  . Not on file   Social History Narrative     Family History  Problem Relation Age of Onset  . Stroke Mother   . Heart failure Father   . Heart failure Brother   . Anesthesia problems Neg Hx   . Hypotension Neg Hx   . Malignant hyperthermia Neg Hx   . Pseudochol deficiency Neg Hx   . Colon cancer Neg Hx   . Colon polyps Neg Hx      Review of Systems: All other systems reviewed  and are otherwise negative except as noted above.  Physical Exam: Filed Vitals:   03/06/16 1141 03/06/16 2044 03/07/16 0508 03/07/16 0924  BP:  108/46 107/48 101/41  Pulse:  60 60 43  Temp:  100.3 F (37.9 C) 99.5 F (37.5 C)   TempSrc:  Oral Oral   Resp:  16 16   Height:      Weight:   172 lb 12.8 oz (78.382 kg)   SpO2: 94% 98% 98%     GEN- The patient is well appearing, alert and oriented x 3 today.   HEENT: normocephalic, atraumatic; sclera clear, conjunctiva pink; hearing intact; oropharynx clear; neck supple, no JVP Lymph- no cervical lymphadenopathy Lungs- Clear to ausculation bilaterally, normal work of breathing.  No wheezes, rales, rhonchi Heart- Regular rate and rhythm, no murmurs, rubs or gallops, PMI not laterally displaced GI- soft, non-tender, non-distended, bowel sounds present Extremities- no clubbing, cyanosis, or edema MS- no significant deformity or atrophy Skin- warm and dry, no rash or lesion Psych- euthymic mood, full affect Neuro- no gross deficits observed  Labs:   Lab Results  Component Value Date   WBC 8.9 03/07/2016   HGB 11.3* 03/07/2016   HCT 34.3* 03/07/2016   MCV 82.5 03/07/2016   PLT 141* 03/07/2016    Recent Labs Lab 03/03/16 0327  03/05/16 1324  NA 143  142  < > 136  K 4.2  4.2  < > 4.0  CL 106  105  < > 100*  CO2 23  24  < > 24  BUN 23*  24*  < > 25*  CREATININE 1.67*  1.61*  < > 2.03*  CALCIUM 9.0  9.1  < > 8.2*  PROT 5.4*  --   --   BILITOT 0.6  --   --   ALKPHOS 66   --   --   ALT 20  --   --   AST 42*  --   --   GLUCOSE 74  71  < > 230*  < > = values in this interval not displayed.    Radiology/Studies:  Ct Head Wo Contrast 03/04/2016  CLINICAL DATA:  Altered mental status, agitation. Assess cerebellar stroke. History of hypertension, diabetes, hypercholesterolemia and colon cancer. EXAM: CT HEAD WITHOUT CONTRAST TECHNIQUE: Contiguous axial images were obtained from the base of the skull through the vertex without intravenous contrast. COMPARISON:  MRI of the brain May 24, 2015 FINDINGS: INTRACRANIAL CONTENTS: The ventricles and sulci are normal for age. No intraparenchymal hemorrhage, mass effect nor midline shift. Confluent supratentorial and pontine white matter hypodensities. Old RIGHT basal ganglia and LEFT thalamus lacunar infarcts. No acute large vascular territory infarcts. No abnormal extra-axial fluid collections. Basal cisterns are patent. Moderate calcific atherosclerosis of the carotid siphons. ORBITS: The included ocular globes and orbital contents are non-suspicious. Status post RIGHT ocular lens implant. SINUSES: Mild paranasal sinus mucosal thickening without air-fluid levels. Mastoid air cells are well aerated. Soft tissue within RIGHT external auditory canal most compatible with cerumen. SKULL/SOFT TISSUES: No skull fracture. No significant soft tissue swelling. IMPRESSION: No acute intracranial process. 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 Electronically Signed   By: Elon Alas M.D.   On: 03/04/2016 00:50   Mr Brain Wo Contrast 03/04/2016  CLINICAL DATA:  Altered mental status. Slurred speech and confusion as well as visual in attention following cardiac catheterization. EXAM: MRI HEAD WITHOUT CONTRAST TECHNIQUE: Multiplanar, multiecho  pulse sequences of the brain and surrounding structures were obtained without intravenous contrast.  COMPARISON:  Head CT 03/04/2016 and MRI 05/24/2015 FINDINGS: The examination had to be discontinued prior to completion due to patient's mental status. Only axial diffusion images were obtained. These demonstrate small areas of acute cortical and white matter infarction in the PCA territory involving the right occipital lobe and splenium of the corpus callosum. A tiny acute infarct is also suspected in the posterior pons. Cerebral atrophy and extensive chronic small vessel white matter disease are again seen. There also chronic infarcts in the left greater than right thalami and pons. No midline shift. IMPRESSION: Diffusion only MRI demonstrate small acute posterior circulation infarcts in the right occipital lobe, corpus callosum, and pons. Electronically Signed   By: Logan Bores M.D.   On: 03/04/2016 12:21   Dg Chest Port 1 View 03/02/2016  CLINICAL DATA:  Patient with chest pain from the left side to the right side with associated pressure. EXAM: PORTABLE CHEST 1 VIEW COMPARISON:  Chest radiograph 07/14/2013. FINDINGS: Stable cardiac and mediastinal contours status post median sternotomy and CABG procedure. No consolidative pulmonary opacities. No pleural effusion or pneumothorax. AC joint degenerative changes. IMPRESSION: No acute cardiopulmonary process. Electronically Signed   By: Lovey Newcomer M.D.   On: 03/02/2016 22:29    EKG: 03/07/16: CHB, LBBB (187ms), 50bpm 03/05/16: appears CHB, QRS 168ms, IVCD, V rate 69 #1 ST, 114 bpm, inferior ST changes (QRS 22ms) TELEMETRY: SR with variiable degree of block including CHB, rates have been 50's-60's, infrequently hgh 40's 03/03/16: Echocardiogram Study Conclusions - Left ventricle: The cavity size was normal. Systolic function was  mildly to moderately reduced. The estimated ejection fraction was  in the range of 40% to 45%. Severe hypokinesis of the  basal-midinferolateral, inferior, and inferoseptal myocardium; in  the distribution of the right  coronary artery. Features are  consistent with a pseudonormal left ventricular filling pattern,  with concomitant abnormal relaxation and increased filling  pressure (grade 2 diastolic dysfunction). - Left atrium: The atrium was mildly dilated. - Atrial septum: No defect or patent foramen ovale was identified. Impressions: - mildly reduced global LV longitudinal strain -16.2%  03/03/16: [facility] Procedures    Left Heart Cath and Cors/Grafts Angiography    Conclusion     Ost RCA to Prox RCA lesion, 90% stenosed.  Dist RCA lesion, 90% stenosed.  Ost RPDA lesion, 90% stenosed.  RPDA lesion, 100% stenosed.  SVG .  Prox Graft to Mid Graft lesion, 100% stenosed.  Ost LAD lesion, 100% stenosed.  SVG .  LIMA .  Ost Cx lesion, 100% stenosed.  SVG .  Severe native CAD with total occlusion of the very proximal LAD and left circumflex coronary arteries, and small RCA with 90% proximal stenosis with bifurcation 90% stenoses in RV marginal branch with probable total occlusion distally.  Patent LIMA graft to the mid LAD.  Patent SVG to the diagonal 1 vessel.  Patent SVG to the OM1 vessel. There did not appear to be a sequential limb to this. However, there was filling of the distal circumflex and distal marginal branches from the OM1 vessel proximal to the graft anastomosis down the AV groove circumflex.  Atretic and probable recent occlusion of a small SVG which had supplied the PDA.  RECOMMENDATION: Medical therap     Assessment and Plan:  1. Acute IW MI, 03/02/16     S/p cath without intervention     Trop continue to increase  No ongoing CP     Continue as per primary cardiology  2. CHB, rates have been 50's-60's for the most part, also has developed new LBBB     BP has been stable     Agree, Suspect secondary to [place]     Patient with no symptoms of bradycardia     Metoprolol 12.5mg  BID was held yesterday, last dose 03/05/16 PM     Continue to monitor  telemetry closely  3. Acute CVA     small acute posterior circulation infarcts in the right occipital lobe, corpus callosum, and pons.     Neuro on case     Recommend aggressive risk factor modification   Signed, Tommye Standard, PA-C 03/07/2016 1:00 PM   I have seen, examined the patient, and reviewed the above assessment and plan.  On exam, bradycardic regular rhythm. Changes to above are made where necessary.  Pt with AV block in the setting of evolving inferior MI.  He is very stable and asymptomatic.  Prior to MI, QRS was narrow.  The patient and his wife are clear that they would like to avoid pacing.  I am optimistic that as his MI resolves that his AV conduction will also improve.  Continue current medical management as guided by Dr Tamala Julian.  Avoid AV nodal agents.  Electrophysiology team to see as needed while here. Please call with questions.   Co Sign: Thompson Grayer, MD 03/07/2016 10:21 PM

## 2016-03-07 NOTE — Progress Notes (Signed)
Occupational Therapy Treatment Patient Details Name: Carlos Wolf MRN: [id number] DOB: [birth year] Today's Date: 03/07/2016    History of present illness 75 y.o. 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 .   OT comments  Patient making good progress towards OT goals. Updated plan of care to reflect new D/C recommendations. At this time recommending CIR. Pt more alert today and able to tolerate OT session, pt even able to sit up at end of session in recliner. Pt with flat affect and decreased initiation, but clinically believe pt will be appropriate for extensive and comprehensive inpatient rehabilitation prior to discharging home.   Follow Up Recommendations  CIR;Supervision/Assistance - 24 hour    Equipment Recommendations  Other (comment) (TBD next venue of care)    Recommendations for Other Services Rehab consult    Precautions / Restrictions Precautions Precautions: Fall;Other (comment) Precaution Comments: 5 falls in past year, has been weak since March 2016 with coloscopy; L visual field cut - WATCH HR Restrictions Weight Bearing Restrictions: No    Mobility Bed Mobility Overal bed mobility: Needs Assistance Bed Mobility: Rolling;Sidelying to Sit Rolling: Min assist Sidelying to sit: Min assist General bed mobility comments: Multimodal cueing for sequencing, technique, and safety.   Transfers Overall transfer level: Needs assistance Equipment used: None Transfers: Sit to/from [facility] Sit to Stand: Min assist Stand pivot transfers: Mod assist   General transfer comment: Heavy min assist for sit to stand, mod assist for multimodal cueing and physical assist to go from EOB to recliner.     Balance Overall balance assessment: Needs assistance Sitting-balance  support: No upper extremity supported;Feet supported Sitting balance-Leahy Scale: Poor   Postural control: Right lateral lean Standing balance support: No upper extremity supported;During functional activity Standing balance-Leahy Scale: Poor   ADL Overall ADL's : Needs assistance/impaired Eating/Feeding: Sitting;Set up Eating/Feeding Details (indicate cue type and reason): per wife report  Grooming: Set up;Supervision/safety;Sitting   Upper Body Bathing: Moderate assistance;Sitting   Lower Body Bathing: Moderate assistance;Sit to/from stand   Upper Body Dressing : Moderate assistance;Sitting   Lower Body Dressing: Maximal assistance;Sit to/from stand   Toilet Transfer: Moderate assistance;Stand-pivot;BSC Toilet Transfer Details (indicate cue type and reason): simulated  Functional mobility during ADLs: Moderate assistance;Cueing for safety (stand pivot with no AD) General ADL Comments: Pt found supine in bed and drousy upon OT entering room. Wife present at bedside. Pt with decreased initiation, but awake and alert once seated EOB. Pt able to follow commands for MMT today. Pt with right lateral lean in sitting and required multimodal cueing to stay at baseline. Pt performed sit to stand with mod assist and stand pivot transfer with mod assist and multimodal cueing for technique and sequencing during transfer.      Cognition   Behavior During Therapy: Flat affect Overall Cognitive Status: Impaired/Different from baseline Area of Impairment: Memory;Following commands;Problem solving;Awareness     Memory: Decreased short-term memory  Following Commands: Follows one step commands with increased time   Awareness: Intellectual Problem Solving: Slow processing;Decreased initiation;Difficulty sequencing;Requires verbal cues;Requires tactile cues                   Pertinent Vitals/ Pain       Pain Assessment: No/denies pain   Frequency Min 3X/week     Progress Toward  Goals  OT Goals(current  goals can now befound in the care plan section)  Progress towards OT goals: Progressing toward goals  Acute Rehab OT Goals Patient Stated Goal: go to rehab (patient and wife)  OT Goal Formulation: With patient/family Time For Goal Achievement: 03/20/16 Potential to Achieve Goals: Good  Plan Discharge plan needs to be updated    End of Session Equipment Utilized During Treatment: Gait belt   Activity Tolerance Patient tolerated treatment well   Patient Left in chair;with call bell/phone within reach;with chair alarm set;with family/visitor present  Nurse Communication Mobility status     Time: [id number] OT Time Calculation (min): 26 min  Charges: OT General Charges $OT Visit: 1 Procedure OT Treatments $Self Care/Home Management : 8-22 mins $Therapeutic Activity: 8-22 mins  Chrys Racer , MS, OTR/L, CLT Pager: [phone]  03/07/2016, 12:49 PM

## 2016-03-07 NOTE — Progress Notes (Signed)
STROKE SERVICE PROGRESS NOTE  SUBJECTIVE : Patient is accompanied by his wife and daughter in the room. His confusion and orientation seemed to have improved. He still has some left-sided vision deficit. OBJECTIVE:   Vitals :  Filed Vitals:   03/07/16 0508 03/07/16 0924  BP: 107/48 101/41  Pulse: 60 43  Temp: 99.5 F (37.5 C)   Resp: 16     Physical exam  .Obese elderly caucasian male Afebrile. Head is nontraumatic. Neck is supple without bruit.    Cardiac exam no murmur or gallop. Lungs are clear to auscultation. Distal pulses are well felt.  Neurological exam :  Awake Alert oriented x 2.Diminished attention, registration and recall. Normal speech and language.eye movements full without nystagmus.fundi were not visualized. Vision acuity and fields appear normal. Hearing is normal. Palatal movements are normal. Face symmetric. Tongue midline. Normal strength, tone, reflexes and coordination. Normal sensation. Gait deferred  No results found.  Carotid dopplers pending Mr Brain Wo Contrast 03/04/2016  Diffusion only MRI demonstrate small acute posterior circulation infarcts in the right occipital lobe, corpus callosum, and pons 2Decho A999333 : Systolic function was  mildly to moderately reduced. The estimated ejection fraction was  in the range of 40% to 45%. Severe hypokinesis of the  basal-midinferolateral, inferior, and inferoseptal myocardium  ASSESSMENT:  Carlos Wolf is a 75 y.o. male with history of hypertension, coronary artery disease, hyperlipidemia, diabetes mellitus, previous occult GI bleeding, and history of colon cancer presenting with confusion and slurred speech following cardiac catheterization due to .  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  Vascular Risk Factors:  Diabetes,Hypertension, hyperlipidemia, obesity, coronary artery disease  PLAN:  Check carotid ultrasound study. Mobilize out  of bed. Physical occupational therapy consults. Continue aspirin and Plavix for stroke prevention. Recommend rehab consult..Discussed with wife and daughter and answered questions.    Antony Contras, MD Medical Director [facility] Pager: [phone] 03/07/2016 2:24 PM

## 2016-03-07 NOTE — Progress Notes (Signed)
Patient refused CPAP. States she is not able to tolerate the mask on her face. She does not wear CPAP at home.

## 2016-03-07 NOTE — Progress Notes (Signed)
Physical Therapy Treatment Patient Details Name: Carlos Wolf MRN: [id number] DOB: Apr 27, 1941 Today's Date: 03/07/2016    History of Present Illness 75 y.o. 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 .    PT Comments    Carlos Wolf made excellent progress today. He ambulated 30 ft (15 ft x2 w/ seated rest break) w/ mod assist to avoid obstacles and for RW management.  He remains very motivated to become as independent as possible and is an excellent candidate for CIR.    Follow Up Recommendations  CIR     Equipment Recommendations  Wheelchair (measurements PT);Wheelchair cushion (measurements PT)    Recommendations for Other Services Rehab consult     Precautions / Restrictions Precautions Precautions: Fall;Other (comment) Precaution Comments: 5 falls in past year, has been weak since March 2016 with coloscopy; L visual field cut - WATCH HR Restrictions Weight Bearing Restrictions: No    Mobility  Bed Mobility Overal bed mobility: Needs Assistance Bed Mobility: Sit to Supine       Sit to supine: Min assist   General bed mobility comments: Multimodal cueing for sequencing, technique, and safety.   Transfers Overall transfer level: Needs assistance Equipment used: Rolling walker (2 wheeled) Transfers: Sit to/from Stand Sit to Stand: Min assist;Mod assist         General transfer comment: Sit>stand x3 (from bed, chair) w/ increasing need of assist.  Pt slow to stand and requires assist to boost and steady.  Cues for hand placement.  Ambulation/Gait Ambulation/Gait assistance: Mod assist Ambulation Distance (Feet): 30 Feet (15x2 w/ one sitting rest break) Assistive device: Rolling walker (2 wheeled) Gait Pattern/deviations: Step-to pattern;Decreased stride length;Antalgic;Trunk  flexed;Decreased dorsiflexion - left;Shuffle;Decreased step length - left   Gait velocity interpretation: <1.8 ft/sec, indicative of risk for recurrent falls General Gait Details: Step to gait pattern w/ Lt LE shuffling and fatiguing quickly.  Mod assist managing RW as he pushes it too far ahead and bumps into obstacles on his Lt.  Multimodal cues for upright posture.   Stairs            Wheelchair Mobility    Modified Rankin (Stroke Patients Only) Modified Rankin (Stroke Patients Only) Pre-Morbid Rankin Score: No symptoms Modified Rankin: Moderately severe disability     Balance Overall balance assessment: Needs assistance Sitting-balance support: No upper extremity supported;Feet supported Sitting balance-Leahy Scale: Poor     Standing balance support: No upper extremity supported;During functional activity Standing balance-Leahy Scale: Poor Standing balance comment: RW and outside assist for support                    Cognition Arousal/Alertness: Awake/alert Behavior During Therapy: Flat affect Overall Cognitive Status: Impaired/Different from baseline Area of Impairment: Memory;Following commands;Problem solving;Awareness     Memory: Decreased short-term memory Following Commands: Follows one step commands with increased time   Awareness: Intellectual Problem Solving: Slow processing;Decreased initiation;Difficulty sequencing;Requires verbal cues;Requires tactile cues      Exercises      General Comments        Pertinent Vitals/Pain Pain Assessment: No/denies pain    Home Living                      Prior Function            PT Goals (current  goals can now be found in the care plan section) Acute Rehab PT Goals Patient Stated Goal: 1627 PT Goal Formulation: With patient/family Time For Goal Achievement: 03/20/16 Potential to Achieve Goals: Good Progress towards PT goals: Progressing toward goals    Frequency  Min 4X/week     PT Plan Discharge plan needs to be updated    Co-evaluation             End of Session Equipment Utilized During Treatment: Gait belt Activity Tolerance: Patient limited by fatigue Patient left: in bed;with call bell/phone within reach;with bed alarm set;with family/visitor present     Time: [id number] PT Time Calculation (min) (ACUTE ONLY): 28 min  Charges:  $Gait Training: 8-22 mins $Therapeutic Activity: 8-22 mins                    G Codes:      Collie Siad PT, DPT  Pager: [phone] Phone: [phone] 03/07/2016, 4:52 PM

## 2016-03-08 DIAGNOSIS — I442 Atrioventricular block, complete: Secondary | ICD-10-CM

## 2016-03-08 DIAGNOSIS — I6340 Cerebral infarction due to embolism of unspecified cerebral artery: Secondary | ICD-10-CM

## 2016-03-08 LAB — GLUCOSE, CAPILLARY
GLUCOSE-CAPILLARY: 174 mg/dL — AB (ref 65–99)
GLUCOSE-CAPILLARY: 344 mg/dL — AB (ref 65–99)
GLUCOSE-CAPILLARY: 243 mg/dL — AB (ref 65–99)
Glucose-Capillary: 299 mg/dL — ABNORMAL HIGH (ref 65–99)

## 2016-03-08 LAB — CBC
HCT: 28.9 % — ABNORMAL LOW (ref 39.0–52.0)
Hemoglobin: 9.8 g/dL — ABNORMAL LOW (ref 13.0–17.0)
MCH: 28.5 pg (ref 26.0–34.0)
MCHC: 33.9 g/dL (ref 30.0–36.0)
MCV: 84 fL (ref 78.0–100.0)
PLATELETS: 193 10*3/uL (ref 150–400)
RBC: 3.44 MIL/uL — AB (ref 4.22–5.81)
RDW: 16 % — ABNORMAL HIGH (ref 11.5–15.5)
WBC: 8.2 10*3/uL (ref 4.0–10.5)

## 2016-03-08 NOTE — Clinical Social Work Note (Signed)
CSW met with patient and his wife. [name] states that they want CIR services.  CSW will remain available for SNF backup.  Carlos Wolf, [place]

## 2016-03-08 NOTE — Progress Notes (Signed)
STROKE SERVICE PROGRESS NOTE  SUBJECTIVE : Patient is accompanied by his wife  in the room. His confusion and orientation seemed to have improved. He still has some left-sided vision deficit.Carotid dopplers personally reviewed show no significant stenosis.Rehab team has assessed him OBJECTIVE:   Vitals :  Filed Vitals:   03/07/16 2146 03/08/16 0519  BP: 110/46 106/53  Pulse: 58 54  Temp: 98.4 F (36.9 C) 98.1 F (36.7 C)  Resp: 16 16    Physical exam  .Obese elderly caucasian male Afebrile. Head is nontraumatic. Neck is supple without bruit.    Cardiac exam no murmur or gallop. Lungs are clear to auscultation. Distal pulses are well felt.  Neurological exam :  Awake Alert oriented x 2.Diminished attention, registration and recall. Normal speech and language.eye movements full without nystagmus.fundi were not visualized. Vision acuity and fields appear normal. Hearing is normal. Palatal movements are normal. Face symmetric. Tongue midline. Normal strength, tone, reflexes and coordination. Normal sensation. Gait deferred  No results found.  Carotid dopplers  There is no obvious evidence of hemodynamically significant internal carotid artery stenosis bilaterally. Vertebral arteries are patent with antegrade flow. Mr Brain Wo Contrast 03/04/2016  Diffusion only MRI demonstrate small acute posterior circulation infarcts in the right occipital lobe, corpus callosum, and pons 2Decho A999333 : Systolic function was  mildly to moderately reduced. The estimated ejection fraction was  in the range of 40% to 45%. Severe hypokinesis of the  basal-midinferolateral, inferior, and inferoseptal myocardium Carotid dopplers :  ASSESSMENT:  Mr. NICKALI REUL is a 75 y.o. male with history of hypertension, coronary artery disease, hyperlipidemia, diabetes mellitus, previous occult GI bleeding, and history of colon cancer presenting with confusion and slurred speech following cardiac  catheterization due to .  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  Vascular Risk Factors:  Diabetes,Hypertension, hyperlipidemia, obesity, coronary artery disease  PLAN:    Mobilize out of bed. DC to rehab when bed avalalble. Continue aspirin and Plavix for stroke prevention. F/U as outpatient in stroke clinic in 2 months. Do not drive till vision loss improves.t..Discussed with wife and answered questions.    Antony Contras, MD Medical Director [facility] Pager: [phone] 03/08/2016 10:38 AM

## 2016-03-08 NOTE — Progress Notes (Signed)
Inpatient Diabetes Program Recommendations  AACE/ADA: New Consensus Statement on Inpatient Glycemic Control (2015)  Target Ranges:  Prepandial:   less than 140 mg/dL      Peak postprandial:   less than 180 mg/dL (1-2 hours)      Critically ill patients:  140 - 180 mg/dL   Review of Glycemic Control  Results for APOSTOLOS, BIERNAT (MRN [id number]) as of 03/08/2016 13:22  Ref. Range 03/07/2016 12:00 03/07/2016 16:52 03/07/2016 21:49 03/08/2016 06:17 03/08/2016 11:38  Glucose-Capillary Latest Ref Range: 65-99 mg/dL 258 (H) 256 (H) 286 (H) 174 (H) 299 (H)     Inpatient Diabetes Program Recommendations: Insulin - Basal: add low dose Levemir 15 units    Thank you. Lorenda Peck, RD, LDN, CDE Inpatient Diabetes Coordinator [phone]

## 2016-03-08 NOTE — Progress Notes (Signed)
Physical Therapy Treatment Patient Details Name: Carlos Wolf MRN: [id number] DOB: [birth year] Today's Date: 03/08/2016    History of Present Illness 75 y.o. 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 .    PT Comments    Carlos Wolf made good progress today, ambulating 85 ft w/ min assist for RW management and cues to attend to Lt visual field.  He remains excellent candidate for CIR w/ potential to reach mod I level of assist.  Pt will benefit from continued skilled PT services to increase functional independence and safety.   Follow Up Recommendations  CIR     Equipment Recommendations  Wheelchair (measurements PT);Wheelchair cushion (measurements PT)    Recommendations for Other Services       Precautions / Restrictions Precautions Precautions: Fall;Other (comment) Precaution Comments: 5 falls in past year, has been weak since March 2016 with coloscopy; L visual field cut - WATCH HR Restrictions Weight Bearing Restrictions: No    Mobility  Bed Mobility Overal bed mobility: Needs Assistance Bed Mobility: Supine to Sit     Supine to sit: Min assist;HOB elevated     General bed mobility comments: Min assist to elevate trunk. Pt w/ heavy use of bed rail w/ increased time and effot.  Transfers Overall transfer level: Needs assistance Equipment used: Rolling walker (2 wheeled) Transfers: Sit to/from Stand Sit to Stand: Min assist;Mod assist         General transfer comment: Sit>stand x (from bed, chair) w/ increasing need of assist.  Pt slow to stand and requires assist to boost and steady.  Cues for hand placement.  Ambulation/Gait Ambulation/Gait assistance: Min assist;+2 safety/equipment Ambulation Distance (Feet): 85 Feet (1 seated rest break.  44ft, 35 ft) Assistive device:  Rolling walker (2 wheeled) Gait Pattern/deviations: Step-to pattern;Decreased stride length;Shuffle;Decreased weight shift to left;Narrow base of support   Gait velocity interpretation: <1.8 ft/sec, indicative of risk for recurrent falls General Gait Details: Lt LE demonstrated improved endurance today but continues to demonstrate poor foot clearance Bil.  Improved managment of RW w/ min assist at times to avoid obstacles in Lt visual field, cues to attent to Lt.  One seated rest break after ambulating 50 ft.     Stairs            Wheelchair Mobility    Modified Rankin (Stroke Patients Only) Modified Rankin (Stroke Patients Only) Pre-Morbid Rankin Score: No symptoms Modified Rankin: Moderately severe disability     Balance Overall balance assessment: Needs assistance Sitting-balance support: No upper extremity supported;Feet supported Sitting balance-Leahy Scale: Poor Sitting balance - Comments: UE support needed sitting EOB.  Pt leans to the Rt, pillow propped at end of session. Postural control: Right lateral lean Standing balance support: Bilateral upper extremity supported;During functional activity Standing balance-Leahy Scale: Poor Standing balance comment: Relies on RW and outside assist for support                    Cognition Arousal/Alertness: Awake/alert Behavior During Therapy: Flat affect Overall Cognitive Status: Impaired/Different from baseline Area of Impairment: Memory;Following commands;Problem solving;Awareness     Memory: Decreased short-term memory Following Commands: Follows one step commands with increased time   Awareness: Intellectual Problem Solving: Slow processing;Decreased initiation;Difficulty sequencing;Requires verbal cues;Requires tactile cues General Comments: Cues to turn head to the Lt to avoid walking into  obstacles.      Exercises General Exercises - Lower Extremity Long Arc Quad: Strengthening;Left;10 reps;Seated;Other  (comment) (w/ manual resistance)    General Comments        Pertinent Vitals/Pain Pain Assessment: No/denies pain    Home Living                      Prior Function            PT Goals (current goals can now be found in the care plan section) Acute Rehab PT Goals Patient Stated Goal: to walk PT Goal Formulation: With patient/family Time For Goal Achievement: 03/20/16 Potential to Achieve Goals: Good Progress towards PT goals: Progressing toward goals    Frequency  Min 4X/week    PT Plan Current plan remains appropriate    Co-evaluation             End of Session Equipment Utilized During Treatment: Gait belt Activity Tolerance: Patient limited by fatigue Patient left: with call bell/phone within reach;with family/visitor present;in chair;with chair alarm set     Time: [id number] PT Time Calculation (min) (ACUTE ONLY): 36 min  Charges:  $Gait Training: 23-37 mins                    G Codes:      Collie Wolf PT, DPT  Pager: [phone] Phone: [phone] 03/08/2016, 3:03 PM

## 2016-03-08 NOTE — Progress Notes (Signed)
Pt placed on Cpap 12 CM H20 tolerating well

## 2016-03-08 NOTE — Consult Note (Signed)
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 Daneen Schick   HPI: Carlos Wolf is a 75 y.o. right handed male with history of hypertension, hyperlipidemia, diabetes mellitus, CAD status post CABG 2002 at [facility]. Patient lives with spouse was independent prior to admission using occasional cane. He presented 03/02/2016 with chest discomfort. He was given aspirin in route to the hospital on heparin drip initiated in the ER. EKG abnormal inferior leads but remained chest pain-free. Initial troponin 1.22. Echocardiogram ejection fraction of 45% severe hypokinesis grade 2 diastolic dysfunction. Underwent cardiac catheterization 03/03/2016 showing severe native CAD with total occlusion of the very proximal LAD and left circumflex coronary arteries and small RCA with 90% proximal stenosis. Recommended medical therapy maintained on aspirin and Plavix. Post catheterization with slurred speech and altered mental status. MRI of the brain showed small acute posterior circulation infarcts in the right occipital lobe, corpus callosum and pons. EEG suggestive of right hemispheric focal cerebral dysfunction. No seizure activity recorded. Subcutaneous Lovenox added for DVT prophylaxis. Close monitoring of bradycardia remained asymptomatic. Tolerating a regular consistency diet. Physical and occupational therapy evaluations completed with recommendations of physical medicine rehabilitation consult.  Patient is oriented to person and hospital but not city or name of hospital, not oriented to date or month  Review of Systems  Constitutional: Negative for fever and chills.  HENT: Negative for hearing loss.   Eyes: Negative for blurred vision and double vision.  Respiratory: Negative for cough and shortness of breath.   Cardiovascular: Positive for chest pain and  palpitations. Negative for leg swelling.  Gastrointestinal: Positive for constipation. Negative for nausea and vomiting.       GERD  Genitourinary: Negative for dysuria and hematuria.  Musculoskeletal: Positive for myalgias.  Skin: Negative for rash.  Neurological: Positive for weakness. Negative for seizures, loss of consciousness and headaches.  All other systems reviewed and are negative.  Past Medical History  Diagnosis Date  . Hypertension   . High cholesterol   . Diabetes mellitus   . Acid reflux   . Occult GI bleeding     per patient blood in rectum my only problem  . Skin cancer     to nose  . Diabetes mellitus ([place]) 02/04/2013  . Colon cancer [facility])    Past Surgical History  Procedure Laterality Date  . Cardiac surgery    . Tonsillectomy    . Skin cancer excision      nose  . 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 Circumcision  10/24/2011    Procedure: CIRCUMCISION ADULT;  Surgeon: Marissa Nestle;  Location: [facility];  Service: Urology;  Laterality: N/A;  with repair of glans penis  . Cystoscopy  10/24/2011    Procedure: CYSTOSCOPY FLEXIBLE;  Surgeon: Marissa Nestle;  Location: [facility];  Service: Urology;;  . Esophagogastroduodenoscopy N/A 12/13/2012    Procedure: ESOPHAGOGASTRODUODENOSCOPY (EGD);  Surgeon: Rogene Houston, MD;  Location: [facility];  Service: Endoscopy;  Laterality: N/A;  . Flexible sigmoidoscopy N/A 12/13/2012    Procedure: FLEXIBLE SIGMOIDOSCOPY;  Surgeon: Rogene Houston, MD;  Location: [facility];  Service: Endoscopy;  Laterality: N/A;  . Colonoscopy N/A 12/27/2012    Procedure: COLONOSCOPY;  Surgeon: Rogene Houston, MD;  Location: [facility];  Service: Endoscopy;  Laterality: N/A;  730-rescheduled to 200 Ann to  notify pt  . Eus N/A 01/16/2013    Procedure: LOWER ENDOSCOPIC ULTRASOUND (EUS) radial only, 60 minutes, staging of rectal cancer, colonoscopy to follow;  Surgeon: Milus Banister, MD;  Location: [facility];   Service: Endoscopy;  Laterality: N/A;  . Colonoscopy N/A 01/16/2013    Procedure: COLONOSCOPY;  Surgeon: Milus Banister, MD;  Location: [facility];  Service: Endoscopy;  Laterality: N/A;  . Cataract extraction Right   . Hemicolectomy    . Cardiac catheterization N/A 03/03/2016    Procedure: Left Heart Cath and Cors/Grafts Angiography;  Surgeon: Troy Sine, MD;  [facility]: [facility];  Service: Cardiovascular;  Laterality: N/A;   Family History  Problem Relation Age of Onset  . Stroke Mother   . Heart failure Father   . Heart failure Brother   . Anesthesia problems Neg Hx   . Hypotension Neg Hx   . Malignant hyperthermia Neg Hx   . Pseudochol deficiency Neg Hx   . Colon cancer Neg Hx   . Colon polyps Neg Hx    Social History:  reports that he has never smoked. He has never used smokeless tobacco. He reports that he does not drink alcohol or use illicit drugs. Allergies:  Allergies  Allergen Reactions  . Docusate Sodium Hives  . Neosporin [Neomycin-Bacitracin Zn-Polymyx] Itching  . Percocet [Oxycodone-Acetaminophen] Nausea And Vomiting  . Adhesive [Tape] Itching, Rash and Other (See Comments)    Also pulls skin if left on too long -Please use paper tape   Medications Prior to Admission  Medication Sig Dispense Refill  . aspirin EC 81 MG tablet Take 81 mg by mouth at bedtime.     . Cholecalciferol (VITAMIN D3) 5000 units TABS Take 5,000 Units by mouth daily.    . Insulin Degludec (TRESIBA FLEXTOUCH) 200 UNIT/ML SOPN Inject 25-35 Units into the skin at bedtime. Inject 25 units if CBG <200, 35 units if CBG >200    . metFORMIN (GLUCOPHAGE) 850 MG tablet Take 850 mg by mouth 2 (two) times daily with a meal.    . Multiple Vitamin (MULTIVITAMIN WITH MINERALS) TABS tablet Take 1 tablet by mouth daily. Centrum    . omeprazole (PRILOSEC) 20 MG capsule Take 20 mg by mouth at bedtime.     Marland Kitchen PRESCRIPTION MEDICATION Inhale into the lungs at bedtime. CPAP    . simvastatin (ZOCOR) 40  MG tablet Take 40 mg by mouth at bedtime.       Home: Home Living Family/patient expects to be discharged to:: Skilled nursing facility Living Arrangements: Spouse/significant other Available Help at Discharge: Family Type of Home: House Home Access: Stairs to enter Technical brewer of Steps: 1 Becker: Laundry or work area in [place]: Kasandra Knudsen - single point, Radio producer - quad, Environmental consultant - standard, Environmental consultant - 2 wheels, Environmental consultant - 4 wheels, Bedside commode, Shower seat  Functional History: Prior Function Level of Independence: Independent Functional Status:  Mobility: Bed Mobility Overal bed mobility: Needs Assistance Bed Mobility: Sit to Supine Rolling: Min assist Sidelying to sit: Min assist Sit to supine: Min assist General bed mobility comments: Multimodal cueing for sequencing, technique, and safety.  Transfers Overall transfer level: Needs assistance Equipment used: Rolling walker (2 wheeled) Transfers: Sit to/from Stand Sit to Stand: Min assist, Mod assist Stand pivot transfers: Mod assist General transfer comment: Sit>stand x3 (from bed, chair) w/ increasing need of assist.  Pt slow to stand and requires assist to boost and steady.  Cues for hand placement. Ambulation/Gait  Ambulation/Gait assistance: Mod assist Ambulation Distance (Feet): 30 Feet (15x2 w/ one sitting rest break) Assistive device: Rolling walker (2 wheeled) Gait Pattern/deviations: Step-to pattern, Decreased stride length, Antalgic, Trunk flexed, Decreased dorsiflexion - left, Shuffle, Decreased step length - left General Gait Details: Step to gait pattern w/ Lt LE shuffling and fatiguing quickly.  Mod assist managing RW as he pushes it too far ahead and bumps into obstacles on his Lt.  Multimodal cues for upright posture. Gait velocity interpretation: <1.8 ft/sec, indicative of risk for recurrent falls    ADL: ADL Overall ADL's : Needs assistance/impaired Eating/Feeding: Sitting, Set  up Eating/Feeding Details (indicate cue type and reason): per wife report  Grooming: Set up, Supervision/safety, Sitting Upper Body Bathing: Moderate assistance, Sitting Lower Body Bathing: Moderate assistance, Sit to/from stand Upper Body Dressing : Moderate assistance, Sitting Lower Body Dressing: Maximal assistance, Sit to/from stand Toilet Transfer: Moderate assistance, Stand-pivot, BSC Toilet Transfer Details (indicate cue type and reason): simulated  Functional mobility during ADLs: Moderate assistance, Cueing for safety (stand pivot with no AD) General ADL Comments: Pt found supine in bed and drousy upon OT entering room. Wife present at bedside. Pt with decreased initiation, but awake and alert once seated EOB. Pt able to follow commands for MMT today. Pt with right lateral lean in sitting and required multimodal cueing to stay at baseline. Pt performed sit to stand with mod assist and stand pivot transfer with mod assist and multimodal cueing for technique and sequencing during transfer.   Cognition: Cognition Overall Cognitive Status: Impaired/Different from baseline Orientation Level: Oriented to person, Disoriented to place, Disoriented to time, Disoriented to situation Cognition Arousal/Alertness: Awake/alert Behavior During Therapy: Flat affect Overall Cognitive Status: Impaired/Different from baseline Area of Impairment: Memory, Following commands, Problem solving, Awareness Memory: Decreased short-term memory Following Commands: Follows one step commands with increased time Awareness: Intellectual Problem Solving: Slow processing, Decreased initiation, Difficulty sequencing, Requires verbal cues, Requires tactile cues  Blood pressure 106/53, pulse 54, temperature 98.1 F (36.7 C), temperature source Oral, resp. rate 16, height 5\' 9"  (1.753 m), weight 79.017 kg (174 lb 3.2 oz), SpO2 94 %. Physical Exam  Constitutional: He is oriented to person, place, and time. He appears  well-developed.  HENT:  Head: Normocephalic.  Eyes: EOM are normal.  Neck: Normal range of motion. Neck supple. No thyromegaly present.  Cardiovascular:  Cardiac rate controlled  Respiratory: Effort normal and breath sounds normal. No respiratory distress.  GI: Soft. Bowel sounds are normal. He exhibits no distension.  Neurological: He is alert and oriented to person, place, and time.  Speech is dysarthric but intelligible. Follows commands. Fair awareness of deficits  Skin: Skin is warm and dry.  Left homonomous hemianopsia noted on confrontation testing Motor strength is 4/5 bilateral deltoid, biceps, triceps, grip, hip flexor, knee extensor, ankle dorsiflexor and plantar flexor  Results for orders placed or performed during the hospital encounter of 03/02/16 (from the past 24 hour(s))  Glucose, capillary     Status: Abnormal   Collection Time: 03/07/16 12:00 PM  Result Value Ref Range   Glucose-Capillary 258 (H) 65 - 99 mg/dL   Comment 1 Notify RN   Glucose, capillary     Status: Abnormal   Collection Time: 03/07/16  4:52 PM  Result Value Ref Range   Glucose-Capillary 256 (H) 65 - 99 mg/dL   Comment 1 Notify RN   Glucose, capillary     Status: Abnormal   Collection Time: 03/07/16  9:49 PM  Result Value  Ref Range   Glucose-Capillary 286 (H) 65 - 99 mg/dL  CBC     Status: Abnormal   Collection Time: 03/08/16  2:41 AM  Result Value Ref Range   WBC 8.2 4.0 - 10.5 K/uL   RBC 3.44 (L) 4.22 - 5.81 MIL/uL   Hemoglobin 9.8 (L) 13.0 - 17.0 g/dL   HCT 28.9 (L) 39.0 - 52.0 %   MCV 84.0 78.0 - 100.0 fL   MCH 28.5 26.0 - 34.0 pg   MCHC 33.9 30.0 - 36.0 g/dL   RDW 16.0 (H) 11.5 - 15.5 %   Platelets 193 150 - 400 K/uL  Glucose, capillary     Status: Abnormal   Collection Time: 03/08/16  6:17 AM  Result Value Ref Range   Glucose-Capillary 174 (H) 65 - 99 mg/dL   No results found.  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 Does the need for close, 24 hr/day medical supervision in concert with the patient's rehab needs make it unreasonable for this patient to be served in a less intensive setting? Yes 2. Co-Morbidities requiring supervision/potential complications: Coronary artery disease, diabetes with peripheral neuropathy, Combined systolic and diastolic congestive heart failure 3. Due to bladder management, bowel management, safety, skin/wound care, disease management, medication administration, pain management and patient education, does the patient require 24 hr/day rehab nursing? Yes 4. Does the patient require coordinated care of a physician, rehab nurse, PT (1-2 hrs/day, 55 days/week), OT (1-2 hrs/day, 55 days/week) and SLP (0.5-1 hrs/day, 5 days/week) to address physical and functional deficits in the context of the above medical diagnosis(es)? Yes Addressing deficits in the following areas: balance, endurance, locomotion, strength, transferring, bowel/bladder control, bathing, dressing, feeding, grooming, toileting, cognition and psychosocial support 5. Can the patient actively participate in an intensive therapy program of at least 3 hrs of therapy per day at least 5 days per week? Yes 6. The potential for patient to make measurable gains while on inpatient rehab is excellent 7. Anticipated functional outcomes upon discharge from inpatient rehab are supervision  with PT, supervision with OT, supervision with SLP. 8. Estimated rehab length of stay to reach the above functional goals is: 10 of 14 days 9. Does the patient have adequate social supports and living environment to accommodate these discharge functional goals? Yes 10. Anticipated D/C setting: Home 11. Anticipated post D/C treatments: [place] therapy 12. Overall Rehab/Functional Prognosis: excellent  RECOMMENDATIONS: This patient's condition is appropriate for continued  rehabilitative care in the following setting: CIR Patient has agreed to participate in recommended program. Yes Note that insurance prior authorization may be required for reimbursement for recommended care.  Comment:     03/08/2016

## 2016-03-08 NOTE — Progress Notes (Signed)
Rehab admissions - I met with patient and his wife.  We have called and opened the case requesting acute inpatient rehab admission.  I will follow up after I hear back from [facility] case manager.  Call me for questions.  #[phone]

## 2016-03-08 NOTE — Progress Notes (Signed)
       Patient Name: DENNIS SIGNS Date of Encounter: 03/08/2016    SUBJECTIVE: Patient focuses and is able to track my movements around the room. Speech is more spontaneous and patient more engaging. His wife is present. He has no chest pain complaints.  TELEMETRY:  Normal sinus rhythm: Filed Vitals:   03/07/16 0924 03/07/16 2146 03/08/16 0519 03/08/16 1140  BP: 101/41 110/46 106/53 115/47  Pulse: 43 58 54 53  Temp:  98.4 F (36.9 C) 98.1 F (36.7 C) 97.6 F (36.4 C)  TempSrc:  Oral Oral Oral  Resp:  16 16 16   Height:      Weight:   174 lb 3.2 oz (79.017 kg)   SpO2:  93% 94% 99%    Intake/Output Summary (Last 24 hours) at 03/08/16 1220 Last data filed at 03/08/16 0915  Gross per 24 hour  Intake    840 ml  Output    150 ml  Net    690 ml   LABS: Basic Metabolic Panel:  Recent Labs  03/05/16 1324  NA 136  K 4.0  CL 100*  CO2 24  GLUCOSE 230*  BUN 25*  CREATININE 2.03*  CALCIUM 8.2*   CBC:  Recent Labs  03/07/16 0407 03/08/16 0241  WBC 8.9 8.2  HGB 11.3* 9.8*  HCT 34.3* 28.9*  MCV 82.5 84.0  PLT 141* 193   Cardiac Enzymes:  Recent Labs  03/05/16 1324 03/05/16 1905  TROPONINI 20.06* 25.99*     Radiology/Studies:  No new data  Physical Exam: Blood pressure 115/47, pulse 53, temperature 97.6 F (36.4 C), temperature source Oral, resp. rate 16, height 5\' 9"  (1.753 m), weight 174 lb 3.2 oz (79.017 kg), SpO2 99 %. Weight change: 1 lb 6.4 oz (0.635 kg)  Wt Readings from Last 3 Encounters:  03/08/16 174 lb 3.2 oz (79.017 kg)  01/11/15 170 lb (77.111 kg)  02/10/13 191 lb 0.6 oz (86.655 kg)    He is awake and alert. He is able to focus on moving objects. Neck veins are not distended Cardiac exam reveals an S4/S3 gallop Extremities reveal no edema Neuro exam reveals a flat affect was still slight gaze preference rightward.  ASSESSMENT:  1. Completed inferior infarction without recurrent angina due to occlusion of saphenous vein graft to the  right coronary. 2. High-grade AV block has resolved to either sinus bradycardia with first-degree AV block or second-degree AV block on monitor. Heart rate is 54 bpm 3. 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 Plan transferred to inpatient rehabilitation service 2. Get 12-lead EKG today. The plan for AV block is to continue with observation. Third degree heart block is now resolved TE the second-degree or first-degree. We should avoid medications that we will increase AV node conduction time (i.e. block AV node).  Demetrios Isaacs 03/08/2016, 12:20 PM

## 2016-03-09 ENCOUNTER — Inpatient Hospital Stay (HOSPITAL_COMMUNITY)
Admission: RE | Admit: 2016-03-09 | Discharge: 2016-04-06 | DRG: 056 | Disposition: E | Payer: Medicare Other | Source: Intra-hospital | Attending: Physical Medicine & Rehabilitation | Admitting: Physical Medicine & Rehabilitation

## 2016-03-09 ENCOUNTER — Encounter (HOSPITAL_COMMUNITY): Payer: Self-pay | Admitting: *Deleted

## 2016-03-09 DIAGNOSIS — E1122 Type 2 diabetes mellitus with diabetic chronic kidney disease: Secondary | ICD-10-CM

## 2016-03-09 DIAGNOSIS — I25111 Atherosclerotic heart disease of native coronary artery with angina pectoris with documented spasm: Secondary | ICD-10-CM | POA: Insufficient documentation

## 2016-03-09 DIAGNOSIS — G4700 Insomnia, unspecified: Secondary | ICD-10-CM

## 2016-03-09 DIAGNOSIS — R000 Tachycardia, unspecified: Secondary | ICD-10-CM

## 2016-03-09 DIAGNOSIS — Z951 Presence of aortocoronary bypass graft: Secondary | ICD-10-CM

## 2016-03-09 DIAGNOSIS — I63431 Cerebral infarction due to embolism of right posterior cerebral artery: Secondary | ICD-10-CM | POA: Diagnosis present

## 2016-03-09 DIAGNOSIS — R269 Unspecified abnormalities of gait and mobility: Secondary | ICD-10-CM

## 2016-03-09 DIAGNOSIS — D62 Acute posthemorrhagic anemia: Secondary | ICD-10-CM | POA: Diagnosis not present

## 2016-03-09 DIAGNOSIS — Z79899 Other long term (current) drug therapy: Secondary | ICD-10-CM | POA: Diagnosis not present

## 2016-03-09 DIAGNOSIS — K219 Gastro-esophageal reflux disease without esophagitis: Secondary | ICD-10-CM

## 2016-03-09 DIAGNOSIS — I2510 Atherosclerotic heart disease of native coronary artery without angina pectoris: Secondary | ICD-10-CM | POA: Diagnosis not present

## 2016-03-09 DIAGNOSIS — I129 Hypertensive chronic kidney disease with stage 1 through stage 4 chronic kidney disease, or unspecified chronic kidney disease: Secondary | ICD-10-CM

## 2016-03-09 DIAGNOSIS — Z85828 Personal history of other malignant neoplasm of skin: Secondary | ICD-10-CM | POA: Diagnosis not present

## 2016-03-09 DIAGNOSIS — I469 Cardiac arrest, cause unspecified: Secondary | ICD-10-CM

## 2016-03-09 DIAGNOSIS — E119 Type 2 diabetes mellitus without complications: Secondary | ICD-10-CM | POA: Insufficient documentation

## 2016-03-09 DIAGNOSIS — N189 Chronic kidney disease, unspecified: Secondary | ICD-10-CM

## 2016-03-09 DIAGNOSIS — E785 Hyperlipidemia, unspecified: Secondary | ICD-10-CM | POA: Diagnosis not present

## 2016-03-09 DIAGNOSIS — Z7982 Long term (current) use of aspirin: Secondary | ICD-10-CM | POA: Diagnosis not present

## 2016-03-09 DIAGNOSIS — I214 Non-ST elevation (NSTEMI) myocardial infarction: Secondary | ICD-10-CM | POA: Diagnosis not present

## 2016-03-09 DIAGNOSIS — Z85038 Personal history of other malignant neoplasm of large intestine: Secondary | ICD-10-CM | POA: Diagnosis not present

## 2016-03-09 DIAGNOSIS — Z794 Long term (current) use of insulin: Secondary | ICD-10-CM | POA: Diagnosis not present

## 2016-03-09 DIAGNOSIS — I69398 Other sequelae of cerebral infarction: Secondary | ICD-10-CM | POA: Diagnosis present

## 2016-03-09 DIAGNOSIS — H53462 Homonymous bilateral field defects, left side: Secondary | ICD-10-CM

## 2016-03-09 DIAGNOSIS — N179 Acute kidney failure, unspecified: Secondary | ICD-10-CM | POA: Diagnosis not present

## 2016-03-09 DIAGNOSIS — I69319 Unspecified symptoms and signs involving cognitive functions following cerebral infarction: Secondary | ICD-10-CM | POA: Diagnosis not present

## 2016-03-09 DIAGNOSIS — I441 Atrioventricular block, second degree: Secondary | ICD-10-CM

## 2016-03-09 LAB — CBC
HCT: 29.8 % — ABNORMAL LOW (ref 39.0–52.0)
Hemoglobin: 9.9 g/dL — ABNORMAL LOW (ref 13.0–17.0)
MCH: 28.2 pg (ref 26.0–34.0)
MCHC: 33.2 g/dL (ref 30.0–36.0)
MCV: 84.9 fL (ref 78.0–100.0)
PLATELETS: 202 10*3/uL (ref 150–400)
RBC: 3.51 MIL/uL — ABNORMAL LOW (ref 4.22–5.81)
RDW: 15.9 % — AB (ref 11.5–15.5)
WBC: 6.4 10*3/uL (ref 4.0–10.5)

## 2016-03-09 LAB — GLUCOSE, CAPILLARY
GLUCOSE-CAPILLARY: 189 mg/dL — AB (ref 65–99)
GLUCOSE-CAPILLARY: 276 mg/dL — AB (ref 65–99)
GLUCOSE-CAPILLARY: 230 mg/dL — AB (ref 65–99)
GLUCOSE-CAPILLARY: 298 mg/dL — AB (ref 65–99)

## 2016-03-09 MED ORDER — ATORVASTATIN CALCIUM 80 MG PO TABS
80.0000 mg | ORAL_TABLET | Freq: Every day | ORAL | Status: DC
  Administered 2016-03-09 – 2016-03-15 (×7): 80 mg via ORAL
  Filled 2016-03-09 (×8): qty 1

## 2016-03-09 MED ORDER — ISOSORBIDE MONONITRATE ER 30 MG PO TB24: 30.0000 mg | ORAL_TABLET | Freq: Every day | ORAL | Status: AC

## 2016-03-09 MED ORDER — CLOPIDOGREL BISULFATE 75 MG PO TABS
75.0000 mg | ORAL_TABLET | Freq: Every day | ORAL | Status: DC
  Administered 2016-03-10 – 2016-03-15 (×6): 75 mg via ORAL
  Filled 2016-03-09 (×5): qty 1

## 2016-03-09 MED ORDER — ENOXAPARIN SODIUM 40 MG/0.4ML ~~LOC~~ SOLN: 40.0000 mg | SUBCUTANEOUS | Status: DC

## 2016-03-09 MED ORDER — NITROGLYCERIN 0.4 MG SL SUBL: 0.4000 mg | SUBLINGUAL_TABLET | SUBLINGUAL | Status: AC | PRN

## 2016-03-09 MED ORDER — ASPIRIN 81 MG PO TBEC: 81.0000 mg | DELAYED_RELEASE_TABLET | Freq: Every day | ORAL | Status: AC

## 2016-03-09 MED ORDER — CLOPIDOGREL BISULFATE 75 MG PO TABS: 75.0000 mg | ORAL_TABLET | Freq: Every day | ORAL | Status: AC

## 2016-03-09 MED ORDER — PANTOPRAZOLE SODIUM 40 MG PO TBEC
40.0000 mg | DELAYED_RELEASE_TABLET | Freq: Every day | ORAL | Status: DC
  Administered 2016-03-10 – 2016-03-15 (×6): 40 mg via ORAL
  Filled 2016-03-09 (×6): qty 1

## 2016-03-09 MED ORDER — INSULIN DETEMIR 100 UNIT/ML ~~LOC~~ SOLN
20.0000 [IU] | Freq: Every day | SUBCUTANEOUS | Status: DC
  Administered 2016-03-09 – 2016-03-15 (×7): 20 [IU] via SUBCUTANEOUS
  Filled 2016-03-09 (×7): qty 0.2

## 2016-03-09 MED ORDER — ACETAMINOPHEN 325 MG PO TABS
650.0000 mg | ORAL_TABLET | ORAL | Status: DC | PRN
  Administered 2016-03-10: 650 mg via ORAL
  Filled 2016-03-09: qty 2

## 2016-03-09 MED ORDER — ASPIRIN EC 81 MG PO TBEC
81.0000 mg | DELAYED_RELEASE_TABLET | Freq: Every day | ORAL | Status: DC
  Administered 2016-03-09 – 2016-03-15 (×7): 81 mg via ORAL
  Filled 2016-03-09 (×7): qty 1

## 2016-03-09 MED ORDER — INSULIN ASPART 100 UNIT/ML ~~LOC~~ SOLN
0.0000 [IU] | Freq: Three times a day (TID) | SUBCUTANEOUS | Status: DC
  Administered 2016-03-09: 6 [IU] via SUBCUTANEOUS
  Administered 2016-03-09: 8 [IU] via SUBCUTANEOUS
  Administered 2016-03-10: 2 [IU] via SUBCUTANEOUS
  Administered 2016-03-10: 10 [IU] via SUBCUTANEOUS
  Administered 2016-03-10 (×2): 8 [IU] via SUBCUTANEOUS
  Administered 2016-03-11 (×2): 6 [IU] via SUBCUTANEOUS
  Administered 2016-03-11: 12 [IU] via SUBCUTANEOUS
  Administered 2016-03-11: 4 [IU] via SUBCUTANEOUS
  Administered 2016-03-12: 10 [IU] via SUBCUTANEOUS
  Administered 2016-03-12 (×2): 6 [IU] via SUBCUTANEOUS
  Administered 2016-03-13 (×2): 4 [IU] via SUBCUTANEOUS
  Administered 2016-03-13: 8 [IU] via SUBCUTANEOUS
  Administered 2016-03-13: 6 [IU] via SUBCUTANEOUS
  Administered 2016-03-14: 4 [IU] via SUBCUTANEOUS
  Administered 2016-03-14: 2 [IU] via SUBCUTANEOUS
  Administered 2016-03-14 – 2016-03-15 (×2): 4 [IU] via SUBCUTANEOUS

## 2016-03-09 MED ORDER — NITROGLYCERIN 0.4 MG SL SUBL
0.4000 mg | SUBLINGUAL_TABLET | SUBLINGUAL | Status: DC | PRN
  Administered 2016-03-11 – 2016-03-15 (×2): 0.4 mg via SUBLINGUAL
  Filled 2016-03-09 (×2): qty 1

## 2016-03-09 MED ORDER — ISOSORBIDE MONONITRATE ER 30 MG PO TB24
30.0000 mg | ORAL_TABLET | Freq: Every day | ORAL | Status: DC
  Administered 2016-03-10 – 2016-03-13 (×4): 30 mg via ORAL
  Filled 2016-03-09 (×5): qty 1

## 2016-03-09 MED ORDER — ATORVASTATIN CALCIUM 80 MG PO TABS: 80.0000 mg | ORAL_TABLET | Freq: Every day | ORAL | Status: AC

## 2016-03-09 MED ORDER — ADULT MULTIVITAMIN W/MINERALS CH
1.0000 | ORAL_TABLET | Freq: Every day | ORAL | Status: DC
  Administered 2016-03-10 – 2016-03-15 (×6): 1 via ORAL
  Filled 2016-03-09 (×6): qty 1

## 2016-03-09 MED ORDER — ZOLPIDEM TARTRATE 5 MG PO TABS: 2.5000 mg | ORAL_TABLET | Freq: Every evening | ORAL | Status: DC | PRN

## 2016-03-09 MED ORDER — VITAMIN D 1000 UNITS PO TABS
5000.0000 [IU] | ORAL_TABLET | Freq: Every day | ORAL | Status: DC
  Administered 2016-03-10 – 2016-03-15 (×6): 5000 [IU] via ORAL
  Filled 2016-03-09 (×6): qty 5

## 2016-03-09 MED ORDER — ENOXAPARIN SODIUM 40 MG/0.4ML ~~LOC~~ SOLN
40.0000 mg | SUBCUTANEOUS | Status: DC
  Administered 2016-03-10 – 2016-03-15 (×6): 40 mg via SUBCUTANEOUS
  Filled 2016-03-09 (×6): qty 0.4

## 2016-03-09 NOTE — Care Management Important Message (Signed)
Important Message  Patient Details  Name: Carlos Wolf MRN: [id number] Date of Birth: 04-Aug-1941   Medicare Important Message Given:  Yes    [name] 03/28/2016, 12:31 PM

## 2016-03-09 NOTE — Progress Notes (Signed)
Retta Diones, RN Rehab Admission Coordinator Cosign Needed Physical Medicine and Rehabilitation PMR Pre-admission 03/27/2016 3:09 PM  Related encounter: ED to Hosp-Admission (Current) from 03/02/2016 in [place] [facility]    Expand All Collapse All   PMR Admission Coordinator Pre-Admission Assessment  Patient: Carlos Wolf is an 75 y.o., male MRN: [id number] DOB: 1941/06/07 Height: '5\' 9"'  (175.3 cm) Weight: 78.2 kg (172 lb 6.4 oz) (bed scale)  Insurance Information HMO: PPO: Yes PCP: IPA: 80/20: OTHER:  PRIMARY: [place] Medicare Policy#: [id number] Subscriber: Arvilla Meres CM Name: Corliss Marcus Phone#: [phone] Fax#: [phone] Pre-Cert#: [id number] Employer: Retired Benefits: Phone #: [phone] Name: Azzie Roup. 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 right handed male with history of hypertension, hyperlipidemia, diabetes mellitus, CAD status post CABG 2002 at [facility]. Patient lives with spouse was independent prior to admission using  occasional cane. He presented 03/02/2016 with chest discomfort. He was given aspirin in route to the hospital on heparin drip initiated in the ER. EKG abnormal inferior leads but remained chest pain-free. Initial troponin 1.22. Echocardiogram ejection fraction of 45% severe hypokinesis grade 2 diastolic dysfunction. Underwent cardiac catheterization 03/03/2016 showing severe native CAD with total occlusion of the very proximal LAD and left circumflex coronary arteries and small RCA with 90% proximal stenosis. Recommended medical therapy maintained on aspirin and Plavix. Post catheterization with slurred speech and altered mental status. MRI of the brain showed small acute posterior circulation infarcts in the right occipital lobe, corpus callosum and pons. EEG suggestive of right hemispheric focal cerebral dysfunction. No seizure activity recorded. Subcutaneous Lovenox added for DVT prophylaxis. Close monitoring of bradycardia remained asymptomatic. Tolerating a regular consistency diet. Physical and occupational therapy evaluations completed with recommendations of physical medicine rehabilitation consult.   Past Medical History  Past Medical History  Diagnosis Date  . Hypertension   . High cholesterol   . Diabetes mellitus   . Acid reflux   . Occult GI bleeding     per patient blood in rectum my only problem  . Skin cancer     to nose  . Diabetes mellitus ([place]) 02/04/2013  . Colon cancer [facility])     Family History  family history includes Heart failure in his brother and father; Stroke in his mother. There is no history of Anesthesia problems, Hypotension, Malignant hyperthermia, Pseudochol deficiency, Colon cancer, or Colon polyps.  Prior Rehab/Hospitalizations: Had therapy hast summer for weakness as an outpatient.  Has the patient had major surgery during 100 days prior to admission? No  Current Medications   Current facility-administered medications:  .  0.9 % sodium chloride infusion, 250 mL, Intravenous, PRN, Troy Sine, MD . 0.9 % sodium chloride infusion, , Intravenous, Continuous, Troy Sine, MD, Stopped at 03/04/16 0000 . acetaminophen (TYLENOL) tablet 650 mg, 650 mg, Oral, Q4H PRN, Troy Sine, MD . aspirin  EC tablet 81 mg, 81 mg, Oral, QHS, Jules Husbands, MD, 81 mg at 03/08/16 2206 . atorvastatin (LIPITOR) tablet 80 mg, 80 mg, Oral, q1800, Jules Husbands, MD, 80 mg at 03/08/16 1731 . atropine 1 MG/10ML injection 0.5 mg, 0.5 mg, Intravenous, PRN, Almyra Deforest, PA . cholecalciferol (VITAMIN D) tablet 5,000 Units, 5,000 Units, Oral, Daily, Jules Husbands, MD, 5,000 Units at 03/19/2016 [phone] . clopidogrel (PLAVIX) tablet 75 mg, 75 mg, Oral, Daily, Rhonda G Barrett, PA-C, 75 mg at 03/08/2016 0948 . enoxaparin (LOVENOX) injection 40 mg, 40 mg, Subcutaneous, Q24H, Troy Sine, MD, 40 mg at 03/07/2016 0947 . insulin aspart (novoLOG) injection 0-24 Units, 0-24 Units, Subcutaneous, TID WC & HS, Jettie Booze, MD, 8 Units at 03/13/2016 1230 . isosorbide mononitrate (IMDUR) 24 hr tablet 30 mg, 30 mg, Oral, Daily, Troy Sine, MD, 30 mg at 03/22/2016 0948 . LORazepam (ATIVAN) injection 1 mg, 1 mg, Intravenous, Q4H PRN, Wallie Char, 1 mg at 03/04/16 0926 . multivitamin with minerals tablet 1 tablet, 1 tablet, Oral, Daily, Jules Husbands, MD, 1 tablet at 03/07/2016 0947 . nitroGLYCERIN (NITROSTAT) SL tablet 0.4 mg, 0.4 mg, Sublingual, Q5 Min x 3 PRN, Jules Husbands, MD . ondansetron [facility]) injection 4 mg, 4 mg, Intravenous, Q6H PRN, Jules Husbands, MD . pantoprazole (PROTONIX) EC tablet 40 mg, 40 mg, Oral, Daily, Jules Husbands, MD, 40 mg at 03/30/2016 0948 . sodium chloride flush (NS) 0.9 % injection 3 mL, 3 mL, Intravenous, Q12H, Jules Husbands, MD, 3 mL at 03/13/2016 1000 . sodium chloride flush (NS) 0.9 % injection 3 mL, 3 mL, Intravenous, PRN, Jules Husbands, MD . sodium chloride flush (NS) 0.9 % injection 3 mL, 3 mL, Intravenous, Q12H, Troy Sine, MD, 3 mL at 03/13/2016 1000 . sodium chloride flush (NS) 0.9 % injection 3 mL, 3 mL, Intravenous, PRN, Troy Sine, MD . zolpidem [facility]) tablet 2.5 mg, 2.5 mg, Oral, QHS PRN, Alphia Moh, MD  Patients Current Diet: Diet heart healthy/carb modified Room service appropriate?: Yes; Fluid consistency:: Thin  Precautions / Restrictions Precautions Precautions: Fall, Other (comment) Precaution Comments: 5 falls in past year, has been weak since March 2016 with coloscopy; L visual field cut - WATCH HR Restrictions Weight Bearing Restrictions: No   Has the patient had 2 or more falls or a fall with injury in the past year?Yes. Patient and wife report 5-7 falls in the past year, but no injury.  Prior Activity Level Community (5-7x/wk): Went out daily, was driving  Development worker, international aid / Paramedic Devices/Equipment: None, Dentures (specify type), CPAP Home Equipment: Kasandra Knudsen - single point, [employer] - quad, Environmental consultant - standard, Environmental consultant - 2 wheels, Walker - 4 wheels, Bedside commode, Shower seat  Prior Device Use: Indicate devices/aids used by the patient prior to current illness, exacerbation or injury? None  Prior Functional Level Prior Function Level of Independence: Independent  Self Care: Did the patient need help bathing, dressing, using the toilet or eating? Independent  Indoor Mobility: Did the patient need assistance with walking from room to room (with or without device)? Independent  Stairs: Did the patient need assistance with internal or external stairs (with or without device)? Independent  Functional Cognition: Did the patient need help planning regular tasks such as shopping or remembering to take medications? Independent  Current Functional Level Cognition  Overall Cognitive Status: Impaired/Different from baseline Orientation Level: Oriented to person, Disoriented to situation, Disoriented to time, Oriented to place Following Commands:  Follows one step commands with increased  time General Comments: Cues to turn head to the Lt to avoid walking into obstacles.    Extremity Assessment (includes Sensation/Coordination)  Upper Extremity Assessment: RUE deficits/detail, LUE deficits/detail RUE Deficits / Details: overall strength grossly 4/5 shoulder, elbow but difficulty with following commands for MMT. Wrist 3+/5 and grip is fair. Pt did reach out with each UE to retrieve washcloth from therapist with accurate reach for target. RUE Coordination: decreased fine motor, decreased gross motor LUE Deficits / Details: same as above.  LUE Coordination: decreased fine motor, decreased gross motor  Lower Extremity Assessment: Overall WFL for tasks assessed (4/5 B knee/ankle with manual muscle testing, functionally seems weaker on L (unable to advance LLE with ambulation) and neglects L side. L visual field cut per OT. )    ADLs  Overall ADL's : Needs assistance/impaired Eating/Feeding: Sitting, Set up Eating/Feeding Details (indicate cue type and reason): per wife report  Grooming: Set up, Supervision/safety, Sitting Upper Body Bathing: Moderate assistance, Sitting Lower Body Bathing: Moderate assistance, Sit to/from stand Upper Body Dressing : Moderate assistance, Sitting Lower Body Dressing: Maximal assistance, Sit to/from stand Toilet Transfer: Moderate assistance, Stand-pivot, BSC Toilet Transfer Details (indicate cue type and reason): simulated  Functional mobility during ADLs: Moderate assistance, Cueing for safety (stand pivot with no AD) General ADL Comments: Pt found supine in bed and drousy upon OT entering room. Wife present at bedside. Pt with decreased initiation, but awake and alert once seated EOB. Pt able to follow commands for MMT today. Pt with right lateral lean in sitting and required multimodal cueing to stay at baseline. Pt performed sit to stand with mod assist and stand pivot transfer with mod assist  and multimodal cueing for technique and sequencing during transfer.     Mobility  Overal bed mobility: Needs Assistance Bed Mobility: Supine to Sit Rolling: Min assist Sidelying to sit: Min assist Supine to sit: Min assist, HOB elevated Sit to supine: Min assist General bed mobility comments: Min assist to elevate trunk. Pt w/ heavy use of bed rail w/ increased time and effot.    Transfers  Overall transfer level: Needs assistance Equipment used: Rolling walker (2 wheeled) Transfers: Sit to/from Stand Sit to Stand: Min assist, Mod assist Stand pivot transfers: Mod assist General transfer comment: Sit>stand x (from bed, chair) w/ increasing need of assist. Pt slow to stand and requires assist to boost and steady. Cues for hand placement.    Ambulation / Gait / Stairs / Wheelchair Mobility  Ambulation/Gait Ambulation/Gait assistance: Min assist, +2 safety/equipment Ambulation Distance (Feet): 85 Feet (1 seated rest break. 22f, 35 ft) Assistive device: Rolling walker (2 wheeled) Gait Pattern/deviations: Step-to pattern, Decreased stride length, Shuffle, Decreased weight shift to left, Narrow base of support General Gait Details: Lt LE demonstrated improved endurance today but continues to demonstrate poor foot clearance Bil. Improved managment of RW w/ min assist at times to avoid obstacles in Lt visual field, cues to attent to Lt. One seated rest break after ambulating 50 ft.  Gait velocity interpretation: <1.8 ft/sec, indicative of risk for recurrent falls    Posture / Balance Dynamic Sitting Balance Sitting balance - Comments: UE support needed sitting EOB. Pt leans to the Rt, pillow propped at end of session. Balance Overall balance assessment: Needs assistance Sitting-balance support: No upper extremity supported, Feet supported Sitting balance-Leahy Scale: Poor Sitting balance - Comments: UE support needed sitting EOB. Pt leans to the Rt, pillow propped at  end of session. Postural control: Right lateral lean  Standing balance support: Bilateral upper extremity supported, During functional activity Standing balance-Leahy Scale: Poor Standing balance comment: Relies on RW and outside assist for support    Special needs/care consideration BiPAP/CPAP Yes CPM No Continuous Drip IV No Dialysis No  Life Vest No Oxygen No Special Bed No Trach Size No Wound Vac (area) No  Skin Scrapes on elbow and hands  Bowel mgmt: Last BM 03/07/16 Bladder mgmt: Voiding in urinal Diabetic mgmt Yes, on both oral medications and insulin at home    Previous Home Environment Living Arrangements: Spouse/significant other Available Help at Discharge: Family Type of Home: House Home Layout: Laundry or work area in basement Home Access: Stairs to enter Technical brewer of Steps: 1 [place]: No  Discharge Living Setting Plans for Discharge Living Setting: Patient's home, House, Lives with (comment) (Lives with wife of 30 years.) Type of Home at Discharge: House Discharge Home Layout: Two level, Laundry or work area in basement, Able to live on main level with bedroom/bathroom (Does not have to go down to basement.) Alternate Level Stairs-Number of Steps: Flight Discharge Home Access: Stairs to enter Technical brewer of Steps: 1 step entry Does the patient have any problems obtaining your medications?: No  Social/Family/Support Systems Patient Roles: Spouse, Parent (Has a wife and 4 children, wife has 3 more children.) Contact Information: Avry Roedl - wife Anticipated Caregiver: wife Anticipated Caregiver's Contact Information: Margaretha Sheffield - wife (h) [phone] (c) [phone] Ability/Limitations of Caregiver: wife can assist. Caregiver Availability: 24/7 Discharge Plan Discussed with Primary Caregiver: Yes Is Caregiver In Agreement with Plan?: Yes Does Caregiver/Family have Issues with  Lodging/Transportation while Pt is in Rehab?: No  Goals/Additional Needs Patient/Family Goal for Rehab: PT/OT/St Supervision goals Expected length of stay: 10-14 days Cultural Considerations: [facility] Dietary Needs: Heart healthy, carb modified, thin liquids Equipment Needs: TBD Pt/Family Agrees to Admission and willing to participate: Yes Program Orientation Provided & Reviewed with Pt/Caregiver Including Roles & Responsibilities: Yes  Decrease burden of Care through IP rehab admission: N/A  Possible need for SNF placement upon discharge: Not planned  Patient Condition: This patient's condition remains as documented in the consult dated 03/08/16, in which the Rehabilitation Physician determined and documented that the patient's condition is appropriate for intensive rehabilitative care in an inpatient rehabilitation facility. Will admit to inpatient rehab today.  Preadmission Screen Completed By: Retta Diones, 03/25/2016 3:20 PM ______________________________________________________________________  Discussed status with Dr. Letta Pate on 03/14/2016 at 1520 and received telephone approval for admission today.  Admission Coordinator: Retta Diones, time1520/Date05/04/17

## 2016-03-09 NOTE — Progress Notes (Signed)
Patient Profile: 75 yo man with PMH of CAD s/p CABG 2002 at [facility], T2DM, dyslipidemia, hypertension, colon cancer s/p surgery 01/2014 who presented with chest discomfort. He ruled in for NSTEMI.   Interval History:  LHC 03/03/16 showed 3/4 patent grafts (LIMA-LAD, SVG-Diag1, SVG-OM1). There was an atretic and probable recent occlusion of a small SVG which had supplied the PDA. Medical therapy recommended.  Post cath, patient developed AMS, confusion and slurred speach. Brain MRI 03/04/16 with small acute posterior circulation infarcts in the right occipital lobe, corpus callosum, and pons.   EKG 03/05/16 ~8:00 PM showed complete heart block with an escape rate of 69 and a wide-complex rhythm either an accelerated idioventricular rhythm or an IV conduction delay. Transient episodes but asymptomatic. Seen by EP 03/07/16. AV conduction abnormality felt to be secondary to recent inferior MI and likely to improve over time. Patient and his wife are clear that they would like to avoid PPM insertion if possible.    Awaiting Transfer to Inpatient Rehab.   Subjective: No complaints currently. Resting comfortably.   Objective: Vital signs in last 24 hours: Temp:  [97.4 F (36.3 C)-98.6 F (37 C)] 97.4 F (36.3 C) (05/04 0509) Pulse Rate:  [53-77] 54 (05/04 0509) Resp:  [16-18] 18 (05/04 0509) BP: (115-124)/(43-47) 124/45 mmHg (05/04 0509) SpO2:  [94 %-99 %] 98 % (05/04 0509) Weight:  [172 lb 6.4 oz (78.2 kg)] 172 lb 6.4 oz (78.2 kg) (05/04 0509) Last BM Date: 03/08/16  Intake/Output from previous day: 05/03 0701 - 05/04 0700 In: 960 [P.O.:960] Out: 900 [Urine:900] Intake/Output this shift: Total I/O In: -  Out: 200 [Urine:200]  Medications Current Facility-Administered Medications  Medication Dose Route Frequency Provider Last Rate Last Dose  . 0.9 %  sodium chloride infusion  250 mL Intravenous PRN Troy Sine, MD      . 0.9 %  sodium chloride infusion   Intravenous Continuous  Troy Sine, MD   Stopped at 03/04/16 0000  . acetaminophen (TYLENOL) tablet 650 mg  650 mg Oral Q4H PRN Troy Sine, MD      . aspirin EC tablet 81 mg  81 mg Oral QHS Jules Husbands, MD   81 mg at 03/08/16 2206  . atorvastatin (LIPITOR) tablet 80 mg  80 mg Oral q1800 Jules Husbands, MD   80 mg at 03/08/16 1731  . atropine 1 MG/10ML injection 0.5 mg  0.5 mg Intravenous PRN Almyra Deforest, PA      . cholecalciferol (VITAMIN D) tablet 5,000 Units  5,000 Units Oral Daily Jules Husbands, MD   5,000 Units at 03/08/16 1000  . clopidogrel (PLAVIX) tablet 75 mg  75 mg Oral Daily Evelene Croon Barrett, PA-C   75 mg at 03/08/16 0929  . enoxaparin (LOVENOX) injection 40 mg  40 mg Subcutaneous Q24H Troy Sine, MD   40 mg at 03/08/16 0930  . insulin aspart (novoLOG) injection 0-24 Units  0-24 Units Subcutaneous TID WC & HS Jettie Booze, MD   4 Units at 03/07/2016 [phone]  . isosorbide mononitrate (IMDUR) 24 hr tablet 30 mg  30 mg Oral Daily Troy Sine, MD   30 mg at 03/08/16 0930  . LORazepam (ATIVAN) injection 1 mg  1 mg Intravenous Q4H PRN Wallie Char   1 mg at 03/04/16 0926  . multivitamin with minerals tablet 1 tablet  1 tablet Oral Daily Jules Husbands, MD   1 tablet at 03/08/16 0929  . nitroGLYCERIN (NITROSTAT) SL tablet 0.4  mg  0.4 mg Sublingual Q5 Min x 3 PRN Jules Husbands, MD      . ondansetron [facility]) injection 4 mg  4 mg Intravenous Q6H PRN Jules Husbands, MD      . pantoprazole (PROTONIX) EC tablet 40 mg  40 mg Oral Daily Jules Husbands, MD   40 mg at 03/08/16 0930  . sodium chloride flush (NS) 0.9 % injection 3 mL  3 mL Intravenous Q12H Jules Husbands, MD   3 mL at 03/08/16 1000  . sodium chloride flush (NS) 0.9 % injection 3 mL  3 mL Intravenous PRN Jules Husbands, MD      . sodium chloride flush (NS) 0.9 % injection 3 mL  3 mL Intravenous Q12H Troy Sine, MD   3 mL at 03/08/16 2207  . sodium chloride flush (NS) 0.9 % injection 3 mL  3 mL Intravenous PRN Troy Sine, MD      . zolpidem (AMBIEN) tablet 2.5  mg  2.5 mg Oral QHS PRN Alphia Moh, MD        PE: He is awake and alert. He is able to focus on moving objects. Neck veins are not distended Cardiac exam reveals an S4/S3 gallop Extremities reveal no edema Neuro exam reveals a flat affect was still slight gaze preference rightward.   Lab Results:   Recent Labs  03/07/16 0407 03/08/16 0241 03/30/2016 0347  WBC 8.9 8.2 6.4  HGB 11.3* 9.8* 9.9*  HCT 34.3* 28.9* 29.8*  PLT 141* 193 202   BMET No results for input(s): NA, K, CL, CO2, GLUCOSE, BUN, CREATININE, CALCIUM in the last 72 hours. PT/INR No results for input(s): LABPROT, INR in the last 72 hours. Cholesterol No results for input(s): CHOL in the last 72 hours.   Assessment/Plan  Principal Problem:   NSTEMI (non-ST elevated myocardial infarction) ([place]) Active Problems:   Diabetes mellitus ([place])   Essential hypertension   GERD (gastroesophageal reflux disease)   Dyslipidemia   Unstable angina (HCC)   Stroke (cerebrum) (HCC)   Altered mental state   Stroke ([place])   Complete heart block ([place])   1. NSTEMI: s/p cath w/ med rx - on ASA, Plavix, high-dose statin, Imdur,  - No BB given HB/ bradycardia - continue current therapy - pt not able to participate in cardiac rehab at this time due to neurologic event  2. CVA:  - MRI c/w small acute posterior circulation infarcts in the right occipital lobe, corpus callosum and pons.  - [place] team following. Continue recs per stroke team. Appreciate their assistance.   3. Transient CHB:  - Seen by EP 03/07/16. Patient and his wife are clear that they would like to avoid PPM insertion if possible. AV conduction abnormalities felt to be secondary to recent inferior MI. Hopefully this will resolve as he recovers from his MI. EKG yesterday showed 2nd degree AV block. Telemetry today shows 2-1 AV conduction secondary to second-degree Mobitz 2 HB. Will repeat 12 lead EKG today. Repeat daily untilr resolved. Continue to  monitor;  - Metoprolol discontinued.  -continue to monitor on telemetry.   4. Dispo: Awaiting transfer to Inpatient Rehab.    LOS: 7 days    Brittainy M. Ladoris Gene 04/03/2016 9:16 AM  The patient has been seen in conjunction with Ellen Henri, PA-C. All aspects of care have been considered and discussed. The patient has been personally interviewed, examined, and all clinical data has been reviewed.   Patient is making significant improvement from the neurological  standpoint.  He is now 7 days post inferior infarction. He continues to have second-degree presumed Mobitz 2 heart block with fixed heart rate during ambulation and at rest. Current heart rate is in the 55 bpm range.  He is awaiting admission to the inpatient rehabilitation service.  We will continue to follow and we should perform daily EKG.  I am prepared to wait up to 2 weeks to see if normal conduction recurrence. If secondary heart block remains fixed, he will need permanent pacemaker prior to discharge.

## 2016-03-09 NOTE — Discharge Summary (Signed)
Discharge Summary    Patient ID: Carlos Wolf,  MRN: [id number], DOB/AGE: [birth year] 76 y.o.  Admit date: 03/02/2016 Discharge date: 03/25/2016  Primary Care Provider: Wende Neighbors Primary Cardiologist: Dr. Harrington Challenger (last seen in 2014)  Discharge Diagnoses    Principal Problem:   NSTEMI (non-ST elevated myocardial infarction) [facility]) Active Problems:   Diabetes mellitus ([place])   Essential hypertension   GERD (gastroesophageal reflux disease)   Dyslipidemia   Unstable angina (HCC)   Stroke (cerebrum) (HCC)   Altered mental state   Stroke ([place])   Complete heart block (HCC)   Allergies Allergies  Allergen Reactions  . Docusate Sodium Hives  . Neosporin [Neomycin-Bacitracin Zn-Polymyx] Itching  . Percocet [Oxycodone-Acetaminophen] Nausea And Vomiting  . Adhesive [Tape] Itching, Rash and Other (See Comments)    Also pulls skin if left on too long -Please use paper tape    Diagnostic Studies/Procedures    Procedures    Left Heart Cath and Cors/Grafts Angiography    Conclusion     Ost RCA to Prox RCA lesion, 90% stenosed.  Dist RCA lesion, 90% stenosed.  Ost RPDA lesion, 90% stenosed.  RPDA lesion, 100% stenosed.  SVG .  Prox Graft to Mid Graft lesion, 100% stenosed.  Ost LAD lesion, 100% stenosed.  SVG .  LIMA .  Ost Cx lesion, 100% stenosed.  SVG .  Severe native CAD with total occlusion of the very proximal LAD and left circumflex coronary arteries, and small RCA with 90% proximal stenosis with bifurcation 90% stenoses in RV marginal branch with probable total occlusion distally.  Patent LIMA graft to the mid LAD.  Patent SVG to the diagonal 1 vessel.  Patent SVG to the OM1 vessel. There did not appear to be a sequential limb to this. However, there was filling of the distal circumflex and distal marginal branches from the OM1 vessel proximal to the graft anastomosis down the AV groove circumflex.  Atretic and probable recent occlusion of a small  SVG which had supplied the PDA.    Brain MRI 03/04/16 EXAM: MRI HEAD WITHOUT CONTRAST  TECHNIQUE: Multiplanar, multiecho pulse sequences of the brain and surrounding structures were obtained without intravenous contrast.  COMPARISON: Head CT 03/04/2016 and MRI 05/24/2015  FINDINGS: The examination had to be discontinued prior to completion due to patient's mental status. Only axial diffusion images were obtained. These demonstrate small areas of acute cortical and white matter infarction in the PCA territory involving the right occipital lobe and splenium of the corpus callosum. A tiny acute infarct is also suspected in the posterior pons.  Cerebral atrophy and extensive chronic small vessel white matter disease are again seen. There also chronic infarcts in the left greater than right thalami and pons. No midline shift.  IMPRESSION: Diffusion only MRI demonstrate small acute posterior circulation infarcts in the right occipital lobe, corpus callosum, and pons.   History of Present Illness     75 yo male with PMH of CAD s/p CABG in 2002 at [facility], T2DM, dyslipidemia, hypertension, colon cancer s/p surgery 01/2014 who presented to [facility] on 03/02/16 with chest discomfort. He ruled in for NSTEMI. Troponins peaked at 25.99.  Hospital Course     Patient was admitted and underwent LHC on 03/03/16. Cath showed 3/4 patent grafts (LIMA-LAD, SVG-Diag1, SVG-OM1). There was an atretic and probable recent occlusion of a small SVG which had supplied the PDA. Medical therapy recommended. No PCI was performed.   Post cath, patient patient developed AMS, confusion and  slurred speach. Brain MRI 03/04/16 showed small acute posterior circulation infarcts in the right occipital lobe, corpus callosum, and pons. Neurology was consulted. Carotid dopplers were negative. 2D echo showed mild-moderately reduced EF at 40-45% with severe hypokinesis of the basal-mid inferolateral, inferior and inferoseptal  myocardium. He was placed on ASA and Plavix per Neurologoy. He did have some improvement in his mental status and orientation, but continued to have left-sided visual deficits.   His hospital course was further complicated by transient CHB, however he remained asymptomatic. Metoprolol was discontinued, however he continued to have persistent bradycardia and HB. Electrophysiology was consulted. He was seen by Dr. Rayann Heman.  It was felt that his AV conduction abnormality was likely subsequent to his recent inferior MI. The patient and his wife were both clear that they wished to avoid PPM insertion, if at all possible. Dr. Rayann Heman felt that this was reasonable along with watchful waiting and avoidance of AV nodal agents. He was monitored further on telemetry. 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 Dr. Tamala Julian recommended daily EKGs to monitor. He recommended waiting up to 2 weeks to see if normal conduction recurrence. If secondary heart block remains fixed, he will need a PPM prior to discharge from inpatient rehab.   Inpatient rehab was advised given his acute stroke. Bed placement is now available and he will be admitted to [facility] inpatient rehab 03/30/2016. He will be maintained on ASA and Plavix. As outlined above, all AV nodal blocking agents have been discontinued. He will need daily EKGs to monitor heart block. As recommended by Dr. Tamala Julian, we will wait up to 2 weeks to see if normal conduction recurrence. If secondary heart block remains fixed, he will need a PPM prior to discharge.   Consultants: Neurology, Electrophysiology, Physical Medicine and Rehabilitation.    Discharge Vitals Blood pressure 127/42, pulse 53, temperature 98.2 F (36.8 C), temperature source Oral, resp. rate 18, height 5\' 9"  (1.753 m), weight 172 lb 6.4 oz (78.2 kg), SpO2 98 %.  Filed Weights   03/07/16 0508 03/08/16 0519 03/30/2016 0509  Weight: 172  lb 12.8 oz (78.382 kg) 174 lb 3.2 oz (79.017 kg) 172 lb 6.4 oz (78.2 kg)    Labs & Radiologic Studies    CBC  Recent Labs  03/08/16 0241 03/19/2016 0347  WBC 8.2 6.4  HGB 9.8* 9.9*  HCT 28.9* 29.8*  MCV 84.0 84.9  PLT 193 [id number]   Basic Metabolic Panel No results for input(s): NA, K, CL, CO2, GLUCOSE, BUN, CREATININE, CALCIUM, MG, PHOS in the last 72 hours. Liver Function Tests No results for input(s): AST, ALT, ALKPHOS, BILITOT, PROT, ALBUMIN in the last 72 hours. No results for input(s): LIPASE, AMYLASE in the last 72 hours. Cardiac Enzymes No results for input(s): CKTOTAL, CKMB, CKMBINDEX, TROPONINI in the last 72 hours. BNP Invalid input(s): POCBNP D-Dimer No results for input(s): DDIMER in the last 72 hours. Hemoglobin A1C No results for input(s): HGBA1C in the last 72 hours. Fasting Lipid Panel No results for input(s): CHOL, HDL, LDLCALC, TRIG, CHOLHDL, LDLDIRECT in the last 72 hours. Thyroid Function Tests No results for input(s): TSH, T4TOTAL, T3FREE, THYROIDAB in the last 72 hours.  Invalid input(s): FREET3 _____________  Ct Head Wo Contrast  03/04/2016  CLINICAL DATA:  Altered mental status, agitation. Assess cerebellar stroke. History of hypertension, diabetes, hypercholesterolemia and colon cancer. EXAM: CT HEAD WITHOUT CONTRAST TECHNIQUE: Contiguous axial images were obtained from  the base of the skull through the vertex without intravenous contrast. COMPARISON:  MRI of the brain May 24, 2015 FINDINGS: INTRACRANIAL CONTENTS: The ventricles and sulci are normal for age. No intraparenchymal hemorrhage, mass effect nor midline shift. Confluent supratentorial and pontine white matter hypodensities. Old RIGHT basal ganglia and LEFT thalamus lacunar infarcts. No acute large vascular territory infarcts. No abnormal extra-axial fluid collections. Basal cisterns are patent. Moderate calcific atherosclerosis of the carotid siphons. ORBITS: The included ocular globes and orbital  contents are non-suspicious. Status post RIGHT ocular lens implant. SINUSES: Mild paranasal sinus mucosal thickening without air-fluid levels. Mastoid air cells are well aerated. Soft tissue within RIGHT external auditory canal most compatible with cerumen. SKULL/SOFT TISSUES: No skull fracture. No significant soft tissue swelling. IMPRESSION: No acute intracranial process. 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 Electronically Signed   By: Elon Alas M.D.   On: 03/04/2016 00:50   Mr Brain Wo Contrast  03/04/2016  CLINICAL DATA:  Altered mental status. Slurred speech and confusion as well as visual in attention following cardiac catheterization. EXAM: MRI HEAD WITHOUT CONTRAST TECHNIQUE: Multiplanar, multiecho pulse sequences of the brain and surrounding structures were obtained without intravenous contrast. COMPARISON:  Head CT 03/04/2016 and MRI 05/24/2015 FINDINGS: The examination had to be discontinued prior to completion due to patient's mental status. Only axial diffusion images were obtained. These demonstrate small areas of acute cortical and white matter infarction in the PCA territory involving the right occipital lobe and splenium of the corpus callosum. A tiny acute infarct is also suspected in the posterior pons. Cerebral atrophy and extensive chronic small vessel white matter disease are again seen. There also chronic infarcts in the left greater than right thalami and pons. No midline shift. IMPRESSION: Diffusion only MRI demonstrate small acute posterior circulation infarcts in the right occipital lobe, corpus callosum, and pons. Electronically Signed   By: Logan Bores M.D.   On: 03/04/2016 12:21   Dg Chest Port 1 View  03/02/2016  CLINICAL DATA:  Patient with chest pain from the left side to the right side with associated pressure. EXAM: PORTABLE CHEST 1 VIEW COMPARISON:  Chest radiograph  07/14/2013. FINDINGS: Stable cardiac and mediastinal contours status post median sternotomy and CABG procedure. No consolidative pulmonary opacities. No pleural effusion or pneumothorax. AC joint degenerative changes. IMPRESSION: No acute cardiopulmonary process. Electronically Signed   By: Lovey Newcomer M.D.   On: 03/02/2016 22:29   Disposition   Pt is being discharged home today in good condition.  Follow-up Plans & Appointments    Follow-up Information    Follow up with Wende Neighbors, MD.   Specialty:  Internal Medicine   Contact information:   [place] [address] [phone]      Discharge Instructions    Amb Referral to Cardiac Rehabilitation    Complete by:  As directed   To [place]  Diagnosis:  NSTEMI     Ambulatory referral to Neurology    Complete by:  As directed   Stroke office f/u in 1 month     Ambulatory referral to Neurology    Complete by:  As directed   Stroke office f/u in 1 month           Discharge Medications   Current Discharge Medication List    START taking these medications   Details  atorvastatin (LIPITOR) 80 MG tablet Take 1 tablet (80 mg  total) by mouth daily at 6 PM.    clopidogrel (PLAVIX) 75 MG tablet Take 1 tablet (75 mg total) by mouth daily.    enoxaparin (LOVENOX) 40 MG/0.4ML injection Inject 0.4 mLs (40 mg total) into the skin daily. Qty: 0 Syringe    isosorbide mononitrate (IMDUR) 30 MG 24 hr tablet Take 1 tablet (30 mg total) by mouth daily.    nitroGLYCERIN (NITROSTAT) 0.4 MG SL tablet Place 1 tablet (0.4 mg total) under the tongue every 5 (five) minutes x 3 doses as needed for chest pain. Refills: 12      CONTINUE these medications which have CHANGED   Details  !! aspirin EC 81 MG EC tablet Take 1 tablet (81 mg total) by mouth at bedtime.     !! - Potential duplicate medications found. Please discuss with provider.    CONTINUE these medications which have NOT CHANGED   Details  !! aspirin EC 81 MG  tablet Take 81 mg by mouth at bedtime.     Cholecalciferol (VITAMIN D3) 5000 units TABS Take 5,000 Units by mouth daily.    Insulin Degludec (TRESIBA FLEXTOUCH) 200 UNIT/ML SOPN Inject 25-35 Units into the skin at bedtime. Inject 25 units if CBG <200, 35 units if CBG >200    metFORMIN (GLUCOPHAGE) 850 MG tablet Take 850 mg by mouth 2 (two) times daily with a meal.    Multiple Vitamin (MULTIVITAMIN WITH MINERALS) TABS tablet Take 1 tablet by mouth daily. Centrum    omeprazole (PRILOSEC) 20 MG capsule Take 20 mg by mouth at bedtime.     PRESCRIPTION MEDICATION Inhale into the lungs at bedtime. CPAP    simvastatin (ZOCOR) 40 MG tablet Take 40 mg by mouth at bedtime.      !! - Potential duplicate medications found. Please discuss with provider.        Outstanding Labs/Studies   none  Duration of Discharge Encounter   Greater than 30 minutes including physician time.  Signed, Lyda Jester PA-C 04/02/2016, 3:53 PM    The patient has been seen in conjunction with Ellen Henri, PA-C. All aspects of care have been considered and discussed. The patient has been personally interviewed, examined, and all clinical data has been reviewed.   The patient is being discharged to the inpatient rehabilitation center.  EKGs will be needed to monitor second-degree heart block.  At discharge he will need to be set for a 30 day cardiac monitor.  He may ultimately need a permanent pacemaker if second-degree heart block does not resolve.

## 2016-03-09 NOTE — Progress Notes (Signed)
Rehab admissions - I have approval for acute inpatient rehab admission from [facility].  I need medical clearance for admission to inpatient rehab today.  I do have a bed available today for admission.  Call me for questions.  [id number]

## 2016-03-09 NOTE — Progress Notes (Signed)
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 Daneen Schick   HPI: Carlos Wolf is a 75 y.o. right handed male with history of hypertension, hyperlipidemia, diabetes mellitus, CAD status post CABG 2002 at [facility]. Patient lives with spouse was independent prior to admission using occasional cane. He presented 03/02/2016 with chest discomfort. He was given aspirin in route to the hospital on heparin drip initiated in the ER. EKG abnormal inferior leads but remained chest pain-free. Initial troponin 1.22. Echocardiogram ejection fraction of 45% severe hypokinesis grade 2 diastolic dysfunction. Underwent cardiac catheterization 03/03/2016 showing severe native CAD with total occlusion of the very proximal LAD and left circumflex coronary arteries and small RCA with 90% proximal stenosis. Recommended medical therapy maintained on aspirin and Plavix. Post catheterization with slurred speech and altered mental status. MRI of the brain showed small acute posterior circulation infarcts in the right occipital lobe, corpus callosum and pons. EEG suggestive of right hemispheric focal cerebral dysfunction. No seizure activity recorded. Subcutaneous Lovenox added for DVT prophylaxis. Close monitoring of bradycardia remained asymptomatic. Tolerating a regular consistency diet. Physical and occupational therapy evaluations completed with recommendations of physical medicine rehabilitation consult.  Patient is oriented to person and hospital but not city or name of hospital, not oriented to date or month  Review of Systems    Constitutional: Negative for fever and chills.  HENT: Negative for hearing loss.  Eyes: Negative for blurred vision and double vision.  Respiratory: Negative for cough and shortness of breath.  Cardiovascular: Positive for chest pain and palpitations. Negative for leg swelling.  Gastrointestinal: Positive for constipation. Negative for nausea and vomiting.   GERD  Genitourinary: Negative for dysuria and hematuria.  Musculoskeletal: Positive for myalgias.  Skin: Negative for rash.  Neurological: Positive for weakness. Negative for seizures, loss of consciousness and headaches.  All other systems reviewed and are negative.  Past Medical History  Diagnosis Date  . Hypertension   . High cholesterol   . Diabetes mellitus   . Acid reflux   . Occult GI bleeding     per patient blood in rectum my only problem  . Skin cancer     to nose  . Diabetes mellitus ([place]) 02/04/2013  . Colon cancer [facility])    Past Surgical History  Procedure Laterality Date  . Cardiac surgery    . Tonsillectomy    . Skin cancer excision      nose  . 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 Circumcision  10/24/2011    Procedure: CIRCUMCISION ADULT; Surgeon: Marissa Nestle; Location: [facility]S; Service: Urology; Laterality: N/A; with repair of glans penis  . Cystoscopy  10/24/2011    Procedure: CYSTOSCOPY FLEXIBLE; Surgeon: Marissa Nestle; Location: [facility]; Service: Urology;;  . Esophagogastroduodenoscopy N/A 12/13/2012    Procedure: ESOPHAGOGASTRODUODENOSCOPY (EGD); Surgeon: Rogene Houston, MD; Location: [facility]; Service: Endoscopy; Laterality: N/A;  . Flexible sigmoidoscopy N/A 12/13/2012    Procedure: FLEXIBLE SIGMOIDOSCOPY; Surgeon: Rogene Houston, MD; Location: [facility]; Service: Endoscopy; Laterality: N/A;  . Colonoscopy N/A 12/27/2012    Procedure: COLONOSCOPY;  Surgeon: Bernadene Person  Gloriann Loan, MD; Location: [facility]; Service: Endoscopy; Laterality: N/A; 730-rescheduled to 200 Ann to notify pt  . Eus N/A 01/16/2013    Procedure: LOWER ENDOSCOPIC ULTRASOUND (EUS) radial only, 60 minutes, staging of rectal cancer, colonoscopy to follow; Surgeon: Milus Banister, MD; Location: [facility]; Service: Endoscopy; Laterality: N/A;  . Colonoscopy N/A 01/16/2013    Procedure: COLONOSCOPY; Surgeon: Milus Banister, MD; Location: [facility]; Service: Endoscopy; Laterality: N/A;  . Cataract extraction Right   . Hemicolectomy    . Cardiac catheterization N/A 03/03/2016    Procedure: Left Heart Cath and Cors/Grafts Angiography; Surgeon: Troy Sine, MD; Location: [facility]; Service: Cardiovascular; Laterality: N/A;   Family History  Problem Relation Age of Onset  . Stroke Mother   . Heart failure Father   . Heart failure Brother   . Anesthesia problems Neg Hx   . Hypotension Neg Hx   . Malignant hyperthermia Neg Hx   . Pseudochol deficiency Neg Hx   . Colon cancer Neg Hx   . Colon polyps Neg Hx    Social History:  reports that he has never smoked. He has never used smokeless tobacco. He reports that he does not drink alcohol or use illicit drugs. Allergies:  Allergies  Allergen Reactions  . Docusate Sodium Hives  . Neosporin [Neomycin-Bacitracin Zn-Polymyx] Itching  . Percocet [Oxycodone-Acetaminophen] Nausea And Vomiting  . Adhesive [Tape] Itching, Rash and Other (See Comments)    Also pulls skin if left on too long -Please use paper tape   Medications Prior to Admission  Medication Sig Dispense Refill  . aspirin EC 81 MG tablet Take 81 mg by mouth at bedtime.     . Cholecalciferol (VITAMIN D3) 5000 units TABS Take 5,000 Units by mouth daily.    . Insulin Degludec (TRESIBA FLEXTOUCH) 200 UNIT/ML SOPN Inject 25-35 Units  into the skin at bedtime. Inject 25 units if CBG <200, 35 units if CBG >200    . metFORMIN (GLUCOPHAGE) 850 MG tablet Take 850 mg by mouth 2 (two) times daily with a meal.    . Multiple Vitamin (MULTIVITAMIN WITH MINERALS) TABS tablet Take 1 tablet by mouth daily. Centrum    . omeprazole (PRILOSEC) 20 MG capsule Take 20 mg by mouth at bedtime.     Marland Kitchen PRESCRIPTION MEDICATION Inhale into the lungs at bedtime. CPAP    . simvastatin (ZOCOR) 40 MG tablet Take 40 mg by mouth at bedtime.       Home: Home Living Family/patient expects to be discharged to:: Skilled nursing facility Living Arrangements: Spouse/significant other Available Help at Discharge: Family Type of Home: House Home Access: Stairs to enter Technical brewer of Steps: 1 [place]: Laundry or work area in [place]: Kasandra Knudsen - single point, Radio producer - quad, Environmental consultant - standard, Environmental consultant - 2 wheels, Environmental consultant - 4 wheels, Bedside commode, Shower seat  Functional History: Prior Function Level of Independence: Independent Functional Status:  Mobility: Bed Mobility Overal bed mobility: Needs Assistance Bed Mobility: Sit to Supine Rolling: Min assist Sidelying to sit: Min assist Sit to supine: Min assist General bed mobility comments: Multimodal cueing for sequencing, technique, and safety.  Transfers Overall transfer level: Needs assistance Equipment used: Rolling walker (2 wheeled) Transfers: Sit to/from Stand Sit to Stand: Min assist, Mod assist Stand pivot transfers: Mod assist General transfer comment: Sit>stand x3 (from bed, chair) w/ increasing need of assist. Pt slow to stand and requires assist to boost and steady. Cues for hand placement.  Ambulation/Gait Ambulation/Gait assistance: Mod assist Ambulation Distance (Feet): 30 Feet (15x2 w/ one sitting rest break) Assistive device: Rolling walker (2 wheeled) Gait Pattern/deviations: Step-to pattern, Decreased stride length,  Antalgic, Trunk flexed, Decreased dorsiflexion - left, Shuffle, Decreased step length - left General Gait Details: Step to gait pattern w/ Lt LE shuffling and fatiguing quickly. Mod assist managing RW as he pushes it too far ahead and bumps into obstacles on his Lt. Multimodal cues for upright posture. Gait velocity interpretation: <1.8 ft/sec, indicative of risk for recurrent falls    ADL: ADL Overall ADL's : Needs assistance/impaired Eating/Feeding: Sitting, Set up Eating/Feeding Details (indicate cue type and reason): per wife report  Grooming: Set up, Supervision/safety, Sitting Upper Body Bathing: Moderate assistance, Sitting Lower Body Bathing: Moderate assistance, Sit to/from stand Upper Body Dressing : Moderate assistance, Sitting Lower Body Dressing: Maximal assistance, Sit to/from stand Toilet Transfer: Moderate assistance, Stand-pivot, BSC Toilet Transfer Details (indicate cue type and reason): simulated  Functional mobility during ADLs: Moderate assistance, Cueing for safety (stand pivot with no AD) General ADL Comments: Pt found supine in bed and drousy upon OT entering room. Wife present at bedside. Pt with decreased initiation, but awake and alert once seated EOB. Pt able to follow commands for MMT today. Pt with right lateral lean in sitting and required multimodal cueing to stay at baseline. Pt performed sit to stand with mod assist and stand pivot transfer with mod assist and multimodal cueing for technique and sequencing during transfer.   Cognition: Cognition Overall Cognitive Status: Impaired/Different from baseline Orientation Level: Oriented to person, Disoriented to place, Disoriented to time, Disoriented to situation Cognition Arousal/Alertness: Awake/alert Behavior During Therapy: Flat affect Overall Cognitive Status: Impaired/Different from baseline Area of Impairment: Memory, Following commands, Problem solving, Awareness Memory: Decreased short-term  memory Following Commands: Follows one step commands with increased time Awareness: Intellectual Problem Solving: Slow processing, Decreased initiation, Difficulty sequencing, Requires verbal cues, Requires tactile cues  Blood pressure 106/53, pulse 54, temperature 98.1 F (36.7 C), temperature source Oral, resp. rate 16, height 5\' 9"  (1.753 m), weight 79.017 kg (174 lb 3.2 oz), SpO2 94 %. Physical Exam  Constitutional: He is oriented to person, place, and time. He appears well-developed.  HENT:  Head: Normocephalic.  Eyes: EOM are normal.  Neck: Normal range of motion. Neck supple. No thyromegaly present.  Cardiovascular:  Cardiac rate controlled  Respiratory: Effort normal and breath sounds normal. No respiratory distress.  GI: Soft. Bowel sounds are normal. He exhibits no distension.  Neurological: He is alert and oriented to person, place, and time.  Speech is dysarthric but intelligible. Follows commands. Fair awareness of deficits  Skin: Skin is warm and dry.  Left homonomous hemianopsia noted on confrontation testing Motor strength is 4/5 bilateral deltoid, biceps, triceps, grip, hip flexor, knee extensor, ankle dorsiflexor and plantar flexor   Lab Results Last 24 Hours    Results for orders placed or performed during the hospital encounter of 03/02/16 (from the past 24 hour(s))  Glucose, capillary Status: Abnormal   Collection Time: 03/07/16 12:00 PM  Result Value Ref Range   Glucose-Capillary 258 (H) 65 - 99 mg/dL   Comment 1 Notify RN   Glucose, capillary Status: Abnormal   Collection Time: 03/07/16 4:52 PM  Result Value Ref Range   Glucose-Capillary 256 (H) 65 - 99 mg/dL   Comment 1 Notify RN   Glucose, capillary Status: Abnormal   Collection Time: 03/07/16 9:49 PM  Result Value Ref Range   Glucose-Capillary 286 (  H) 65 - 99 mg/dL  CBC Status: Abnormal   Collection Time: 03/08/16 2:41 AM  Result Value  Ref Range   WBC 8.2 4.0 - 10.5 K/uL   RBC 3.44 (L) 4.22 - 5.81 MIL/uL   Hemoglobin 9.8 (L) 13.0 - 17.0 g/dL   HCT 28.9 (L) 39.0 - 52.0 %   MCV 84.0 78.0 - 100.0 fL   MCH 28.5 26.0 - 34.0 pg   MCHC 33.9 30.0 - 36.0 g/dL   RDW 16.0 (H) 11.5 - 15.5 %   Platelets 193 150 - 400 K/uL  Glucose, capillary Status: Abnormal   Collection Time: 03/08/16 6:17 AM  Result Value Ref Range   Glucose-Capillary 174 (H) 65 - 99 mg/dL      Imaging Results (Last 48 hours)    No results found.    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 Does the need for close, 24 hr/day medical supervision in concert with the patient's rehab needs make it unreasonable for this patient to be served in a less intensive setting? Yes 2. Co-Morbidities requiring supervision/potential complications: Coronary artery disease, diabetes with peripheral neuropathy, Combined systolic and diastolic congestive heart failure 3. Due to bladder management, bowel management, safety, skin/wound care, disease management, medication administration, pain management and patient education, does the patient require 24 hr/day rehab nursing? Yes 4. Does the patient require coordinated care of a physician, rehab nurse, PT (1-2 hrs/day, 55 days/week), OT (1-2 hrs/day, 55 days/week) and SLP (0.5-1 hrs/day, 5 days/week) to address physical and functional deficits in the context of the above medical diagnosis(es)? Yes Addressing deficits in the following areas: balance, endurance, locomotion, strength, transferring, bowel/bladder control, bathing, dressing, feeding, grooming, toileting, cognition and psychosocial support 5. Can the patient actively participate in an intensive therapy program of at least 3 hrs of therapy per day at least 5 days per week? Yes 6. The  potential for patient to make measurable gains while on inpatient rehab is excellent 7. Anticipated functional outcomes upon discharge from inpatient rehab are supervision with PT, supervision with OT, supervision with SLP. 8. Estimated rehab length of stay to reach the above functional goals is: 10 of 14 days 9. Does the patient have adequate social supports and living environment to accommodate these discharge functional goals? Yes 10. Anticipated D/C setting: Home 11. Anticipated post D/C treatments: [place] therapy 12. Overall Rehab/Functional Prognosis: excellent  RECOMMENDATIONS: This patient's condition is appropriate for continued rehabilitative care in the following setting: CIR Patient has agreed to participate in recommended program. Yes Note that insurance prior authorization may be required for reimbursement for recommended care.  Comment:     03/08/2016       Revision History     Date/Time User Provider Type Action   03/08/2016 11:36 AM Charlett Blake, MD Physician Sign   03/08/2016 7:53 AM Cathlyn Parsons, PA-C Physician Assistant Pend   View Details Report       Routing History     Date/Time From To Method   03/08/2016 11:36 AM Charlett Blake, MD Celene Squibb, MD Fax

## 2016-03-09 NOTE — Progress Notes (Signed)
Pt admitted to unit at 1605 with wife at bedside. RN reviewed rehab booklet, process, and safety plan with verbal understanding. Pt in chair with call bell within reach, wife at bedside.

## 2016-03-09 NOTE — Progress Notes (Signed)
Physical Medicine and Rehabilitation Admission H&P   Chief Complaint  Patient presents with  . Left vision deficits, confusion and left sided weakness.     HPI: Carlos Wolf is a 75 y.o. right handed male with history of HTN, T2DM, CAD s/p CABG, CKD, colon cancer who was admitted on 03/02/2016 with Chest pain. EKG with ST changes and elevated cardiac enzymes noted and he was started on IV heparin for NSTEMI. 2 D echo with EF 40-45% with severe hypokinesis of basal-midinferolateral, inferior and inferoseptal myocardium with grade 2 diastolic dysfunction. He underwent cardiac cath on 04/28 and post procedure developed confusion with agitation, slurred speech and visual deficits. 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 MRI brain showed acute small PCA infarcts affecting right occipital lobe, corpus callosum and pons. EEG without evidence of seizure activity. Dr. Leonie Man recommended continuing ASA/Plavix for non dominant embolic infarcts due to heart catheterization. Carotid dopplers without significant ICA stenosis. On 05/01 he developed CHB with new LBB and Dr. Rayann Heman was consulted for input. Metoprolol discontinued and heart block resolved. Dr. Tamala Julian recommends avoiding medications that affect AV node and monitor for now.   Patient states that he feels better today  Review of Systems  Constitutional: Positive for malaise/fatigue (has been weak for the past year).  Eyes: Negative for blurred vision and double vision.  Respiratory: Negative for cough and shortness of breath.  Cardiovascular: Negative for chest pain and palpitations.  Gastrointestinal: Positive for heartburn. Negative for vomiting, abdominal pain and constipation.   Urgency with occasional incontinence due to rectal numbness from CA  Genitourinary: Negative for dysuria, urgency and frequency.  Musculoskeletal: Negative for myalgias,  back pain and joint pain.  Neurological: Positive for focal weakness and weakness (BLE weakness--give out with increase in activity). Negative for dizziness, tingling, sensory change and headaches.  Psychiatric/Behavioral: Negative for depression. The patient is not nervous/anxious and does not have insomnia.    Past Medical History  Diagnosis Date  . Hypertension   . High cholesterol   . Diabetes mellitus   . Acid reflux   . Occult GI bleeding     per patient blood in rectum my only problem  . Skin cancer     to nose  . Diabetes mellitus ([place]) 02/04/2013  . Colon cancer [facility]     Past Surgical History  Procedure Laterality Date  . Cardiac surgery    . Tonsillectomy    . Skin cancer excision      nose  . 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 Circumcision  10/24/2011    Procedure: CIRCUMCISION ADULT; Surgeon: Marissa Nestle; Location: [facility]; Service: Urology; Laterality: N/A; with repair of glans penis  . Cystoscopy  10/24/2011    Procedure: CYSTOSCOPY FLEXIBLE; Surgeon: Marissa Nestle; Location: [facility]; Service: Urology;;  . Esophagogastroduodenoscopy N/A 12/13/2012    Procedure: ESOPHAGOGASTRODUODENOSCOPY (EGD); Surgeon: Rogene Houston, MD; Location: [facility]; Service: Endoscopy; Laterality: N/A;  . Flexible sigmoidoscopy N/A 12/13/2012    Procedure: FLEXIBLE SIGMOIDOSCOPY; Surgeon: Rogene Houston, MD; Location: [facility]; Service: Endoscopy; Laterality: N/A;  . Colonoscopy N/A 12/27/2012    Procedure: COLONOSCOPY; Surgeon: Rogene Houston, MD; Location: [facility]; Service: Endoscopy; Laterality: N/A; 730-rescheduled to 200 Ann to notify pt  . Eus N/A 01/16/2013    Procedure: LOWER ENDOSCOPIC ULTRASOUND (EUS) radial only, 60 minutes, staging of rectal  cancer, colonoscopy to follow; Surgeon: Milus Banister, MD;  Location: [facility]; Service: Endoscopy; Laterality: N/A;  . Colonoscopy N/A 01/16/2013    Procedure: COLONOSCOPY; Surgeon: Milus Banister, MD; Location: [facility]; Service: Endoscopy; Laterality: N/A;  . Cataract extraction Right   . Hemicolectomy    . Cardiac catheterization N/A 03/03/2016    Procedure: Left Heart Cath and Cors/Grafts Angiography; Surgeon: Troy Sine, MD; Location: [facility]; Service: Cardiovascular; Laterality: N/A;    Family History  Problem Relation Age of Onset  . Stroke Mother   . Heart failure Father   . Heart failure Brother   . Anesthesia problems Neg Hx   . Hypotension Neg Hx   . Malignant hyperthermia Neg Hx   . Pseudochol deficiency Neg Hx   . Colon cancer Neg Hx   . Colon polyps Neg Hx     Social History: Married. Independent without AD. He reports that he has never smoked. He has never used smokeless tobacco. He reports that he does not drink alcohol or use illicit drugs.   Allergies  Allergen Reactions  . Docusate Sodium Hives  . Neosporin [Neomycin-Bacitracin Zn-Polymyx] Itching  . Percocet [Oxycodone-Acetaminophen] Nausea And Vomiting  . Adhesive [Tape] Itching, Rash and Other (See Comments)    Also pulls skin if left on too long -Please use paper tape    Medications Prior to Admission  Medication Sig Dispense Refill  . aspirin EC 81 MG tablet Take 81 mg by mouth at bedtime.     . Cholecalciferol (VITAMIN D3) 5000 units TABS Take 5,000 Units by mouth daily.    . Insulin Degludec (TRESIBA FLEXTOUCH) 200 UNIT/ML SOPN Inject 25-35 Units into the skin at bedtime. Inject 25 units if CBG <200, 35 units if CBG >200    . metFORMIN (GLUCOPHAGE) 850 MG tablet Take 850 mg by mouth 2 (two) times daily with a meal.    . Multiple Vitamin (MULTIVITAMIN WITH MINERALS) TABS tablet Take 1 tablet by mouth daily.  Centrum    . omeprazole (PRILOSEC) 20 MG capsule Take 20 mg by mouth at bedtime.     Marland Kitchen PRESCRIPTION MEDICATION Inhale into the lungs at bedtime. CPAP    . simvastatin (ZOCOR) 40 MG tablet Take 40 mg by mouth at bedtime.       Home: Home Living Family/patient expects to be discharged to:: Skilled nursing facility Living Arrangements: Spouse/significant other Available Help at Discharge: Family Type of Home: House Home Access: Stairs to enter Technical brewer of Steps: 1 [place]: Laundry or work area in [place]: Kasandra Knudsen - single point, Radio producer - quad, Environmental consultant - standard, Environmental consultant - 2 wheels, Environmental consultant - 4 wheels, Bedside commode, Shower seat  Functional History: Prior Function Level of Independence: Independent  Functional Status:  Mobility: Bed Mobility Overal bed mobility: Needs Assistance Bed Mobility: Supine to Sit Rolling: Min assist Sidelying to sit: Min assist Supine to sit: Min assist, HOB elevated Sit to supine: Min assist General bed mobility comments: Min assist to elevate trunk. Pt w/ heavy use of bed rail w/ increased time and effot. Transfers Overall transfer level: Needs assistance Equipment used: Rolling walker (2 wheeled) Transfers: Sit to/from Stand Sit to Stand: Min assist, Mod assist Stand pivot transfers: Mod assist General transfer comment: Sit>stand x (from bed, chair) w/ increasing need of assist. Pt slow to stand and requires assist to boost and steady. Cues for hand placement. Ambulation/Gait Ambulation/Gait assistance: Min assist, +2 safety/equipment Ambulation Distance (Feet): 85 Feet (1  seated rest break. 29ft, 35 ft) Assistive device: Rolling walker (2 wheeled) Gait Pattern/deviations: Step-to pattern, Decreased stride length, Shuffle, Decreased weight shift to left, Narrow base of support General Gait Details: Lt LE demonstrated improved endurance today but continues to demonstrate poor foot clearance Bil.  Improved managment of RW w/ min assist at times to avoid obstacles in Lt visual field, cues to attent to Lt. One seated rest break after ambulating 50 ft.  Gait velocity interpretation: <1.8 ft/sec, indicative of risk for recurrent falls    ADL: ADL Overall ADL's : Needs assistance/impaired Eating/Feeding: Sitting, Set up Eating/Feeding Details (indicate cue type and reason): per wife report  Grooming: Set up, Supervision/safety, Sitting Upper Body Bathing: Moderate assistance, Sitting Lower Body Bathing: Moderate assistance, Sit to/from stand Upper Body Dressing : Moderate assistance, Sitting Lower Body Dressing: Maximal assistance, Sit to/from stand Toilet Transfer: Moderate assistance, Stand-pivot, BSC Toilet Transfer Details (indicate cue type and reason): simulated  Functional mobility during ADLs: Moderate assistance, Cueing for safety (stand pivot with no AD) General ADL Comments: Pt found supine in bed and drousy upon OT entering room. Wife present at bedside. Pt with decreased initiation, but awake and alert once seated EOB. Pt able to follow commands for MMT today. Pt with right lateral lean in sitting and required multimodal cueing to stay at baseline. Pt performed sit to stand with mod assist and stand pivot transfer with mod assist and multimodal cueing for technique and sequencing during transfer.   Cognition: Cognition Overall Cognitive Status: Impaired/Different from baseline Orientation Level: Oriented to person, Disoriented to situation, Disoriented to time, Oriented to place Cognition Arousal/Alertness: Awake/alert Behavior During Therapy: Flat affect Overall Cognitive Status: Impaired/Different from baseline Area of Impairment: Memory, Following commands, Problem solving, Awareness Memory: Decreased short-term memory Following Commands: Follows one step commands with increased time Awareness: Intellectual Problem Solving: Slow processing, Decreased initiation,  Difficulty sequencing, Requires verbal cues, Requires tactile cues General Comments: Cues to turn head to the Lt to avoid walking into obstacles.     Blood pressure 127/42, pulse 53, temperature 98.2 F (36.8 C), temperature source Oral, resp. rate 18, height 5\' 9"  (1.753 m), weight 78.2 kg (172 lb 6.4 oz), SpO2 98 %. Physical Exam  Nursing note and vitals reviewed. Constitutional: He is oriented to person, place, and time. He appears well-developed and well-nourished.  HENT:  Head: Normocephalic and atraumatic.  Mouth/Throat: Oropharynx is clear and moist.  Well healed scar mid-forehead --donor site for Mohs surgery.  Eyes: Conjunctivae and EOM are normal. Pupils are equal, round, and reactive to light. Right eye exhibits no discharge. Left eye exhibits no discharge.  Neck: Normal range of motion. Neck supple.  Cardiovascular: Normal rate and regular rhythm.  Respiratory: Effort normal and breath sounds normal. He has no wheezes.  GI: Soft. Bowel sounds are normal. He exhibits no distension. There is no tenderness.  Soft reducible umbilical incisional hernia along well healed midline incision.  Musculoskeletal: He exhibits no edema or tenderness.  VV LLE> RLE.  Neurological: He is alert and oriented to person, place, and time. A cranial nerve deficit is present.  Speech clear. HOH. Left HH. Able to follow one and two step commands without difficulty.  Skin: Skin is warm and dry. No rash noted. No erythema.  Psychiatric: He has a normal mood and affect. His behavior is normal.     Lab Results Last 48 Hours    Results for orders placed or performed during the hospital encounter of 03/02/16 (from the  past 48 hour(s))  Glucose, capillary Status: Abnormal   Collection Time: 03/07/16 4:52 PM  Result Value Ref Range   Glucose-Capillary 256 (H) 65 - 99 mg/dL   Comment 1 Notify RN   Glucose, capillary Status: Abnormal   Collection Time: 03/07/16 9:49  PM  Result Value Ref Range   Glucose-Capillary 286 (H) 65 - 99 mg/dL  CBC Status: Abnormal   Collection Time: 03/08/16 2:41 AM  Result Value Ref Range   WBC 8.2 4.0 - 10.5 K/uL   RBC 3.44 (L) 4.22 - 5.81 MIL/uL   Hemoglobin 9.8 (L) 13.0 - 17.0 g/dL   HCT 28.9 (L) 39.0 - 52.0 %   MCV 84.0 78.0 - 100.0 fL   MCH 28.5 26.0 - 34.0 pg   MCHC 33.9 30.0 - 36.0 g/dL   RDW 16.0 (H) 11.5 - 15.5 %   Platelets 193 150 - 400 K/uL  Glucose, capillary Status: Abnormal   Collection Time: 03/08/16 6:17 AM  Result Value Ref Range   Glucose-Capillary 174 (H) 65 - 99 mg/dL  Glucose, capillary Status: Abnormal   Collection Time: 03/08/16 11:38 AM  Result Value Ref Range   Glucose-Capillary 299 (H) 65 - 99 mg/dL  Glucose, capillary Status: Abnormal   Collection Time: 03/08/16 4:47 PM  Result Value Ref Range   Glucose-Capillary 344 (H) 65 - 99 mg/dL  Glucose, capillary Status: Abnormal   Collection Time: 03/08/16 9:26 PM  Result Value Ref Range   Glucose-Capillary 243 (H) 65 - 99 mg/dL  CBC Status: Abnormal   Collection Time: 03/08/2016 3:47 AM  Result Value Ref Range   WBC 6.4 4.0 - 10.5 K/uL   RBC 3.51 (L) 4.22 - 5.81 MIL/uL   Hemoglobin 9.9 (L) 13.0 - 17.0 g/dL   HCT 29.8 (L) 39.0 - 52.0 %   MCV 84.9 78.0 - 100.0 fL   MCH 28.2 26.0 - 34.0 pg   MCHC 33.2 30.0 - 36.0 g/dL   RDW 15.9 (H) 11.5 - 15.5 %   Platelets 202 150 - 400 K/uL  Glucose, capillary Status: Abnormal   Collection Time: 03/14/2016 5:56 AM  Result Value Ref Range   Glucose-Capillary 189 (H) 65 - 99 mg/dL  Glucose, capillary Status: Abnormal   Collection Time: 03/13/2016 11:50 AM  Result Value Ref Range   Glucose-Capillary 276 (H) 65 - 99 mg/dL      Imaging Results (Last 48 hours)    No results found.       Medical Problem List and  Plan: 1. Gait disorder, left homonymous hemianopsia secondary to Right PCA distribution embolic infarcts 2. DVT Prophylaxis/Anticoagulation: Pharmaceutical: Lovenox 3. Pain Management: Tylenol prn for HA effective.  4. Mood: Wife has been supportive and present during the stay. LCSW to follow for evaluation and support.  5. Neuropsych: This patient is capable of making decisions on his own behalf. 6. Skin/Wound Care: Routine pressure relief measures.  7. Fluids/Electrolytes/Nutrition: Monitor I/O check lytes in am.  8. CAD s/pCABG/NSTEMI: To treat medically. On Imdur, Plavix, ASA and Lipitor. .  9. T2DM: Hgb A1c-8.4. Will monitor BS ac/hs. Continue to hold metformin due to renal status. BS poorly controlled. Will add low dose Lantus and Amaryl. (Used Tresiba 25 units for BS,200 and 35 units for BS> 200--will ask wife to bring from home.) 10. Acute on chronic renal failure: BUN/Cr- 23/1.68 at admission--> 25/2.03 post cath.  11. ABLA: Recheck CBC in am.    Post Admission Physician Evaluation: 1. Functional deficits secondary to Right PCA embolic  infarct post catheterization. 2. Patient is admitted to receive collaborative, interdisciplinary care between the physiatrist, rehab nursing staff, and therapy team. 3. Patient's level of medical complexity and substantial therapy needs in context of that medical necessity cannot be provided at a lesser intensity of care such as a SNF. 4. Patient has experienced substantial functional loss from his/her baseline which was documented above under the "Functional History" and "Functional Status" headings. Judging by the patient's diagnosis, physical exam, and functional history, the patient has potential for functional progress which will result in measurable gains while on inpatient rehab. These gains will be of substantial and practical use upon discharge in facilitating mobility and self-care at the household level. 5. Physiatrist will  provide 24 hour management of medical needs as well as oversight of the therapy plan/treatment and provide guidance as appropriate regarding the interaction of the two. 6. 24 hour rehab nursing will assist with bladder management, bowel management, safety, skin/wound care, disease management, medication administration, pain management and patient education and help integrate therapy concepts, techniques,education, etc. 7. PT will assess and treat for/with: pre gait, gait training, endurance , safety, equipment, neuromuscular re education. Goals are: Supervision to modified independent. 8. OT will assess and treat for/with: ADLs, Cognitive perceptual skills, Neuromuscular re education, safety, endurance, equipment. Goals are: Supervision to modified independent. Therapy May proceed with showering this patient. 9. SLP will assess and treat for/with: Field cut, reading, attention, concentration, problem solving, sequencing, medication management. Goals are: Improve attention to the left side, supervision medication management. 10. Case Management and Social Worker will assess and treat for psychological issues and discharge planning. 11. Team conference will be held weekly to assess progress toward goals and to determine barriers to discharge. 12. Patient will receive at least 3 hours of therapy per day at least 5 days per week. 13. ELOS: 10-14 days  14. Prognosis: excellent     Charlett Blake M.D. [facility] FAAPM&R (Sports Med, Neuromuscular Med) Diplomate Am Board of Electrodiagnostic Med  03/15/2016

## 2016-03-09 NOTE — H&P (Addendum)
Physical Medicine and Rehabilitation Admission H&P   Chief Complaint  Patient presents with  . Left vision deficits, confusion and left sided weakness.     HPI: Carlos Wolf is a 75 y.o. right handed male with history of HTN, T2DM, CAD s/p CABG, CKD, colon cancer who was admitted on 03/02/2016 with Chest pain. EKG with ST changes and elevated cardiac enzymes noted and he was started on IV heparin for NSTEMI. 2 D echo with EF 40-45% with severe hypokinesis of basal-midinferolateral, inferior and inferoseptal myocardium with grade 2 diastolic dysfunction. He underwent cardiac cath on 04/28 and post procedure developed confusion with agitation, slurred speech and visual deficits. 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 MRI brain showed acute small PCA infarcts affecting right occipital lobe, corpus callosum and pons. EEG without evidence of seizure activity. Dr. Leonie Man recommended continuing ASA/Plavix for non dominant embolic infarcts due to heart catheterization. Carotid dopplers without significant ICA stenosis. On 05/01 he developed CHB with new LBB and Dr. Rayann Heman was consulted for input. Metoprolol discontinued and heart block resolved. Dr. Tamala Julian recommends avoiding medications that affect AV node and monitor for now.   Patient states that he feels better today  Review of Systems  Constitutional: Positive for malaise/fatigue (has been weak for the past year).  Eyes: Negative for blurred vision and double vision.  Respiratory: Negative for cough and shortness of breath.  Cardiovascular: Negative for chest pain and palpitations.  Gastrointestinal: Positive for heartburn. Negative for vomiting, abdominal pain and constipation.   Urgency with occasional incontinence due to rectal numbness from CA  Genitourinary: Negative for dysuria, urgency and frequency.  Musculoskeletal: Negative  for myalgias, back pain and joint pain.  Neurological: Positive for focal weakness and weakness (BLE weakness--give out with increase in activity). Negative for dizziness, tingling, sensory change and headaches.  Psychiatric/Behavioral: Negative for depression. The patient is not nervous/anxious and does not have insomnia.    Past Medical History  Diagnosis Date  . Hypertension   . High cholesterol   . Diabetes mellitus   . Acid reflux   . Occult GI bleeding     per patient blood in rectum my only problem  . Skin cancer     to nose  . Diabetes mellitus ([place]) 02/04/2013  . Colon cancer [facility])     Past Surgical History  Procedure Laterality Date  . Cardiac surgery    . Tonsillectomy    . Skin cancer excision      nose  . 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 Circumcision  10/24/2011    Procedure: CIRCUMCISION ADULT; Surgeon: Marissa Nestle; Location: [facility]; Service: Urology; Laterality: N/A; with repair of glans penis  . Cystoscopy  10/24/2011    Procedure: CYSTOSCOPY FLEXIBLE; Surgeon: Marissa Nestle; Location: [facility]; Service: Urology;;  . Esophagogastroduodenoscopy N/A 12/13/2012    Procedure: ESOPHAGOGASTRODUODENOSCOPY (EGD); Surgeon: Rogene Houston, MD; Location: [facility]; Service: Endoscopy; Laterality: N/A;  . Flexible sigmoidoscopy N/A 12/13/2012    Procedure: FLEXIBLE SIGMOIDOSCOPY; Surgeon: Rogene Houston, MD; Location: [facility]; Service: Endoscopy; Laterality: N/A;  . Colonoscopy N/A 12/27/2012    Procedure: COLONOSCOPY; Surgeon: Rogene Houston, MD; Location: [facility]; Service: Endoscopy; Laterality: N/A; 730-rescheduled to 200 Ann to notify pt  . Eus N/A 01/16/2013    Procedure: LOWER ENDOSCOPIC ULTRASOUND  (EUS) radial only, 60 minutes, staging of  rectal cancer, colonoscopy to follow; Surgeon: Milus Banister, MD; Location: [facility]; Service: Endoscopy; Laterality: N/A;  . Colonoscopy N/A 01/16/2013    Procedure: COLONOSCOPY; Surgeon: Milus Banister, MD; Location: [facility]; Service: Endoscopy; Laterality: N/A;  . Cataract extraction Right   . Hemicolectomy    . Cardiac catheterization N/A 03/03/2016    Procedure: Left Heart Cath and Cors/Grafts Angiography; Surgeon: Troy Sine, MD; Location: [facility]; Service: Cardiovascular; Laterality: N/A;    Family History  Problem Relation Age of Onset  . Stroke Mother   . Heart failure Father   . Heart failure Brother   . Anesthesia problems Neg Hx   . Hypotension Neg Hx   . Malignant hyperthermia Neg Hx   . Pseudochol deficiency Neg Hx   . Colon cancer Neg Hx   . Colon polyps Neg Hx     Social History: Married. Independent without AD. He reports that he has never smoked. He has never used smokeless tobacco. He reports that he does not drink alcohol or use illicit drugs.   Allergies  Allergen Reactions  . Docusate Sodium Hives  . Neosporin [Neomycin-Bacitracin Zn-Polymyx] Itching  . Percocet [Oxycodone-Acetaminophen] Nausea And Vomiting  . Adhesive [Tape] Itching, Rash and Other (See Comments)    Also pulls skin if left on too long -Please use paper tape    Medications Prior to Admission  Medication Sig Dispense Refill  . aspirin EC 81 MG tablet Take 81 mg by mouth at bedtime.     . Cholecalciferol (VITAMIN D3) 5000 units TABS Take 5,000 Units by mouth daily.    . Insulin Degludec (TRESIBA FLEXTOUCH) 200 UNIT/ML SOPN Inject 25-35 Units into the skin at bedtime. Inject 25 units if CBG <200, 35 units if CBG  >200    . metFORMIN (GLUCOPHAGE) 850 MG tablet Take 850 mg by mouth 2 (two) times daily with a meal.    . Multiple Vitamin (MULTIVITAMIN WITH MINERALS) TABS tablet Take 1 tablet by mouth daily. Centrum    . omeprazole (PRILOSEC) 20 MG capsule Take 20 mg by mouth at bedtime.     Marland Kitchen PRESCRIPTION MEDICATION Inhale into the lungs at bedtime. CPAP    . simvastatin (ZOCOR) 40 MG tablet Take 40 mg by mouth at bedtime.       Home: Home Living Family/patient expects to be discharged to:: Skilled nursing facility Living Arrangements: Spouse/significant other Available Help at Discharge: Family Type of Home: House Home Access: Stairs to enter Technical brewer of Steps: 1 [place]: Laundry or work area in [place]: Kasandra Knudsen - single point, Radio producer - quad, Environmental consultant - standard, Environmental consultant - 2 wheels, Environmental consultant - 4 wheels, Bedside commode, Shower seat  Functional History: Prior Function Level of Independence: Independent  Functional Status:  Mobility: Bed Mobility Overal bed mobility: Needs Assistance Bed Mobility: Supine to Sit Rolling: Min assist Sidelying to sit: Min assist Supine to sit: Min assist, HOB elevated Sit to supine: Min assist General bed mobility comments: Min assist to elevate trunk. Pt w/ heavy use of bed rail w/ increased time and effot. Transfers Overall transfer level: Needs assistance Equipment used: Rolling walker (2 wheeled) Transfers: Sit to/from Stand Sit to Stand: Min assist, Mod assist Stand pivot transfers: Mod assist General transfer comment: Sit>stand x (from bed, chair) w/ increasing need of assist. Pt slow to stand and requires assist to boost and steady. Cues for hand placement. Ambulation/Gait Ambulation/Gait assistance: Min assist, +2 safety/equipment Ambulation Distance (Feet): 85 Feet (1  seated rest break. 17ft, 35 ft) Assistive device: Rolling walker (2 wheeled) Gait  Pattern/deviations: Step-to pattern, Decreased stride length, Shuffle, Decreased weight shift to left, Narrow base of support General Gait Details: Lt LE demonstrated improved endurance today but continues to demonstrate poor foot clearance Bil. Improved managment of RW w/ min assist at times to avoid obstacles in Lt visual field, cues to attent to Lt. One seated rest break after ambulating 50 ft.  Gait velocity interpretation: <1.8 ft/sec, indicative of risk for recurrent falls   ADL: ADL Overall ADL's : Needs assistance/impaired Eating/Feeding: Sitting, Set up Eating/Feeding Details (indicate cue type and reason): per wife report  Grooming: Set up, Supervision/safety, Sitting Upper Body Bathing: Moderate assistance, Sitting Lower Body Bathing: Moderate assistance, Sit to/from stand Upper Body Dressing : Moderate assistance, Sitting Lower Body Dressing: Maximal assistance, Sit to/from stand Toilet Transfer: Moderate assistance, Stand-pivot, BSC Toilet Transfer Details (indicate cue type and reason): simulated  Functional mobility during ADLs: Moderate assistance, Cueing for safety (stand pivot with no AD) General ADL Comments: Pt found supine in bed and drousy upon OT entering room. Wife present at bedside. Pt with decreased initiation, but awake and alert once seated EOB. Pt able to follow commands for MMT today. Pt with right lateral lean in sitting and required multimodal cueing to stay at baseline. Pt performed sit to stand with mod assist and stand pivot transfer with mod assist and multimodal cueing for technique and sequencing during transfer.   Cognition: Cognition Overall Cognitive Status: Impaired/Different from baseline Orientation Level: Oriented to person, Disoriented to situation, Disoriented to time, Oriented to place Cognition Arousal/Alertness: Awake/alert Behavior During Therapy: Flat affect Overall Cognitive Status: Impaired/Different from baseline Area of  Impairment: Memory, Following commands, Problem solving, Awareness Memory: Decreased short-term memory Following Commands: Follows one step commands with increased time Awareness: Intellectual Problem Solving: Slow processing, Decreased initiation, Difficulty sequencing, Requires verbal cues, Requires tactile cues General Comments: Cues to turn head to the Lt to avoid walking into obstacles.     Blood pressure 127/42, pulse 53, temperature 98.2 F (36.8 C), temperature source Oral, resp. rate 18, height 5\' 9"  (1.753 m), weight 78.2 kg (172 lb 6.4 oz), SpO2 98 %. Physical Exam  Nursing note and vitals reviewed. Constitutional: He is oriented to person, place, and time. He appears well-developed and well-nourished.  HENT:  Head: Normocephalic and atraumatic.  Mouth/Throat: Oropharynx is clear and moist.  Well healed scar mid-forehead --donor site for Mohs surgery.  Eyes: Conjunctivae and EOM are normal. Pupils are equal, round, and reactive to light. Right eye exhibits no discharge. Left eye exhibits no discharge.  Neck: Normal range of motion. Neck supple.  Cardiovascular: Normal rate and regular rhythm.  Respiratory: Effort normal and breath sounds normal. He has no wheezes.  GI: Soft. Bowel sounds are normal. He exhibits no distension. There is no tenderness.  Soft reducible umbilical incisional hernia along well healed midline incision.  Musculoskeletal: He exhibits no edema or tenderness.  VV LLE> RLE.  Neurological: He is alert and oriented to person, place, and time. A cranial nerve deficit is present.  Speech clear. HOH. Left HH. Able to follow one and two step commands without difficulty.  Skin: Skin is warm and dry. No rash noted. No erythema.  Psychiatric: He has a normal mood and affect. His behavior is normal.  Motor strength is 4/5 bilateral deltoid, biceps, triceps, grip, hip flexor, knee extensor, ankle dorsiflex and plantar flexion There is evidence of  left homonymous hemianopsia  on visual confrontation testing   Lab Results Last 48 Hours    Results for orders placed or performed during the hospital encounter of 03/02/16 (from the past 48 hour(s))  Glucose, capillary Status: Abnormal   Collection Time: 03/07/16 4:52 PM  Result Value Ref Range   Glucose-Capillary 256 (H) 65 - 99 mg/dL   Comment 1 Notify RN   Glucose, capillary Status: Abnormal   Collection Time: 03/07/16 9:49 PM  Result Value Ref Range   Glucose-Capillary 286 (H) 65 - 99 mg/dL  CBC Status: Abnormal   Collection Time: 03/08/16 2:41 AM  Result Value Ref Range   WBC 8.2 4.0 - 10.5 K/uL   RBC 3.44 (L) 4.22 - 5.81 MIL/uL   Hemoglobin 9.8 (L) 13.0 - 17.0 g/dL   HCT 28.9 (L) 39.0 - 52.0 %   MCV 84.0 78.0 - 100.0 fL   MCH 28.5 26.0 - 34.0 pg   MCHC 33.9 30.0 - 36.0 g/dL   RDW 16.0 (H) 11.5 - 15.5 %   Platelets 193 150 - 400 K/uL  Glucose, capillary Status: Abnormal   Collection Time: 03/08/16 6:17 AM  Result Value Ref Range   Glucose-Capillary 174 (H) 65 - 99 mg/dL  Glucose, capillary Status: Abnormal   Collection Time: 03/08/16 11:38 AM  Result Value Ref Range   Glucose-Capillary 299 (H) 65 - 99 mg/dL  Glucose, capillary Status: Abnormal   Collection Time: 03/08/16 4:47 PM  Result Value Ref Range   Glucose-Capillary 344 (H) 65 - 99 mg/dL  Glucose, capillary Status: Abnormal   Collection Time: 03/08/16 9:26 PM  Result Value Ref Range   Glucose-Capillary 243 (H) 65 - 99 mg/dL  CBC Status: Abnormal   Collection Time: 03/13/2016 3:47 AM  Result Value Ref Range   WBC 6.4 4.0 - 10.5 K/uL   RBC 3.51 (L) 4.22 - 5.81 MIL/uL   Hemoglobin 9.9 (L) 13.0 - 17.0 g/dL   HCT 29.8 (L)  39.0 - 52.0 %   MCV 84.9 78.0 - 100.0 fL   MCH 28.2 26.0 - 34.0 pg   MCHC 33.2 30.0 - 36.0 g/dL   RDW 15.9 (H) 11.5 - 15.5 %   Platelets 202 150 - 400 K/uL  Glucose, capillary Status: Abnormal   Collection Time: 03/15/2016 5:56 AM  Result Value Ref Range   Glucose-Capillary 189 (H) 65 - 99 mg/dL  Glucose, capillary Status: Abnormal   Collection Time: 04/05/2016 11:50 AM  Result Value Ref Range   Glucose-Capillary 276 (H) 65 - 99 mg/dL      Imaging Results (Last 48 hours)    No results found.       Medical Problem List and Plan: 1. Gait disorder, left homonymous hemianopsia secondary to Right PCA distribution embolic infarcts 2. DVT Prophylaxis/Anticoagulation: Pharmaceutical: Lovenox 3. Pain Management: Tylenol prn for HA effective.  4. Mood: Wife has been supportive and present during the stay. LCSW to follow for evaluation and support.  5. Neuropsych: This patient is capable of making decisions on his own behalf. 6. Skin/Wound Care: Routine pressure relief measures.  7. Fluids/Electrolytes/Nutrition: Monitor I/O check lytes in am.  8. CAD s/pCABG/NSTEMI: To treat medically. On Imdur, Plavix, ASA and Lipitor. .  9. T2DM: Hgb A1c-8.4. Will monitor BS ac/hs. Continue to hold metformin due to renal status. BS poorly controlled. Will add low dose Lantus and Amaryl. (Used Tresiba 25 units for BS,200 and 35 units for BS> 200--will ask wife to bring from home.) 10. Acute on chronic renal failure: BUN/Cr- 23/1.68  at admission--> 25/2.03 post cath.  11. ABLA: Recheck CBC in am.    Post Admission Physician Evaluation: 1. Functional deficits secondary to Right PCA embolic infarct post catheterization. 2. Patient is admitted to receive collaborative, interdisciplinary care between the physiatrist, rehab nursing staff, and therapy team. 3. Patient's level of medical complexity and  substantial therapy needs in context of that medical necessity cannot be provided at a lesser intensity of care such as a SNF. 4. Patient has experienced substantial functional loss from his/her baseline which was documented above under the "Functional History" and "Functional Status" headings. Judging by the patient's diagnosis, physical exam, and functional history, the patient has potential for functional progress which will result in measurable gains while on inpatient rehab. These gains will be of substantial and practical use upon discharge in facilitating mobility and self-care at the household level. 5. Physiatrist will provide 24 hour management of medical needs as well as oversight of the therapy plan/treatment and provide guidance as appropriate regarding the interaction of the two. 6. 24 hour rehab nursing will assist with bladder management, bowel management, safety, skin/wound care, disease management, medication administration, pain management and patient education and help integrate therapy concepts, techniques,education, etc. 7. PT will assess and treat for/with: pre gait, gait training, endurance , safety, equipment, neuromuscular re education. Goals are: Supervision to modified independent. 8. OT will assess and treat for/with: ADLs, Cognitive perceptual skills, Neuromuscular re education, safety, endurance, equipment. Goals are: Supervision to modified independent. Therapy May proceed with showering this patient. 9. SLP will assess and treat for/with: Field cut, reading, attention, concentration, problem solving, sequencing, medication management. Goals are: Improve attention to the left side, supervision medication management. 10. Case Management and Social Worker will assess and treat for psychological issues and discharge planning. 11. Team conference will be held weekly to assess progress toward goals and to determine barriers to discharge. 12. Patient will receive at least  3 hours of therapy per day at least 5 days per week. 13. ELOS: 10-14 days  14. Prognosis: excellent     Charlett Blake M.D. [facility] FAAPM&R (Sports Med, Neuromuscular Med) Diplomate [place] Board of Electrodiagnostic Med  03/31/2016

## 2016-03-09 NOTE — Progress Notes (Signed)
CARDIAC REHAB PHASE I   Pt awaiting discharge to CIR soon. Completed MI education with pt and wife at bedside.  Reviewed risk factors, MI book, anti-platelet therapy, activity restrictions, ntg, exercise guidelines, heart healthy diet, carb counting, portion control, sodium restrictions and phase 2 cardiac rehab. Pt and wife verbalized understanding, receptive to [employer]. Pt agrees to phase 2 cardiac rehab referral, will send to [facility] per pt request. Pt in bed, call bell within reach.   Mays Chapel, RN, BSN 03/15/2016 3:08 PM

## 2016-03-09 NOTE — Progress Notes (Signed)
Inpatient Diabetes Program Recommendations  AACE/ADA: New Consensus Statement on Inpatient Glycemic Control (2015)  Target Ranges:  Prepandial:   less than 140 mg/dL      Peak postprandial:   less than 180 mg/dL (1-2 hours)      Critically ill patients:  140 - 180 mg/dL   Review of Glycemic Control  Results for TH[place], ULLOM (MRN [id number]) as of 03/14/2016 12:42  Ref. Range 03/08/2016 11:38 03/08/2016 16:47 03/08/2016 21:26 03/17/2016 05:56 03/14/2016 11:50  Glucose-Capillary Latest Ref Range: 65-99 mg/dL 299 (H) 344 (H) 243 (H) 189 (H) 276 (H)   Inpatient Diabetes Program Recommendations:  Insulin - Basal: add low dose Levemir 15 units    If poor po intake, may need to change Novolog to Q4H.  Will follow. Thank you. Lorenda Peck, RD, LDN, CDE Inpatient Diabetes Coordinator [phone]

## 2016-03-09 NOTE — PMR Pre-admission (Signed)
PMR Admission Coordinator Pre-Admission Assessment  Patient: Carlos Wolf is an 75 y.o., male MRN: [id number] DOB: [birth year] Height: 5' 9" (175.3 cm) Weight: 78.2 kg (172 lb 6.4 oz) (bed scale)              Insurance Information HMO:     PPO: Yes     PCP:       IPA:       80/20:       OTHER:   PRIMARY:  [place] Medicare      Policy#: [id number]      Subscriber: Arvilla Meres CM Name: Corliss Marcus      Phone#: [phone]     Fax#: [phone] Pre-Cert#: [id number]      Employer: Retired Benefits:  Phone #: [phone]     Name:  Azzie Roup. 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 right handed male with history of hypertension, hyperlipidemia, diabetes mellitus, CAD status post CABG 2002 at [facility]. Patient lives with spouse was independent prior to admission using occasional cane. He presented 03/02/2016 with chest discomfort. He was given aspirin in route to the hospital on heparin drip initiated in the ER. EKG abnormal inferior leads but remained chest pain-free. Initial troponin 1.22. Echocardiogram ejection fraction of 45% severe hypokinesis grade 2 diastolic dysfunction. Underwent cardiac catheterization 03/03/2016 showing severe native CAD with total occlusion of the very proximal LAD  and left circumflex coronary arteries and small RCA with 90% proximal stenosis. Recommended medical therapy maintained on aspirin and Plavix. Post catheterization with slurred speech and altered mental status. MRI of the brain showed small acute posterior circulation infarcts in the right occipital lobe, corpus callosum and pons. EEG suggestive of right hemispheric focal cerebral dysfunction. No seizure activity recorded. Subcutaneous Lovenox added for DVT prophylaxis. Close monitoring of bradycardia remained asymptomatic. Tolerating a regular consistency diet. Physical and occupational therapy evaluations completed with recommendations of physical medicine rehabilitation consult.   Past Medical History  Past Medical History  Diagnosis Date  . Hypertension   . High cholesterol   . Diabetes mellitus   . Acid reflux   . Occult GI bleeding     per patient blood in rectum my only problem  . Skin cancer     to nose  . Diabetes mellitus ([place]) 02/04/2013  . Colon cancer [facility])     Family History  family history includes Heart failure in his brother and father; Stroke in his mother. There is no history of Anesthesia problems, Hypotension, Malignant hyperthermia, Pseudochol deficiency, Colon cancer, or Colon polyps.  Prior Rehab/Hospitalizations: Had therapy hast summer for weakness as an outpatient.  Has the patient had major surgery during 100 days prior to  admission? No  Current Medications   Current facility-administered medications:  .  0.9 %  sodium chloride infusion, 250 mL, Intravenous, PRN, Troy Sine, MD .  0.9 %  sodium chloride infusion, , Intravenous, Continuous, Troy Sine, MD, Stopped at 03/04/16 0000 .  acetaminophen (TYLENOL) tablet 650 mg, 650 mg, Oral, Q4H PRN, Troy Sine, MD .  aspirin EC tablet 81 mg, 81 mg, Oral, QHS, Jules Husbands, MD, 81 mg at 03/08/16 2206 .  atorvastatin (LIPITOR) tablet 80 mg, 80 mg, Oral, q1800, Jules Husbands, MD, 80 mg at 03/08/16 1731 .   atropine 1 MG/10ML injection 0.5 mg, 0.5 mg, Intravenous, PRN, Almyra Deforest, PA .  cholecalciferol (VITAMIN D) tablet 5,000 Units, 5,000 Units, Oral, Daily, Jules Husbands, MD, 5,000 Units at 03/28/2016 [phone] .  clopidogrel (PLAVIX) tablet 75 mg, 75 mg, Oral, Daily, Rhonda G Barrett, PA-C, 75 mg at 03/08/2016 0948 .  enoxaparin (LOVENOX) injection 40 mg, 40 mg, Subcutaneous, Q24H, Troy Sine, MD, 40 mg at 03/21/2016 0947 .  insulin aspart (novoLOG) injection 0-24 Units, 0-24 Units, Subcutaneous, TID WC & HS, Jettie Booze, MD, 8 Units at 03/19/2016 1230 .  isosorbide mononitrate (IMDUR) 24 hr tablet 30 mg, 30 mg, Oral, Daily, Troy Sine, MD, 30 mg at 03/19/2016 0948 .  LORazepam (ATIVAN) injection 1 mg, 1 mg, Intravenous, Q4H PRN, Wallie Char, 1 mg at 03/04/16 0926 .  multivitamin with minerals tablet 1 tablet, 1 tablet, Oral, Daily, Jules Husbands, MD, 1 tablet at 03/07/2016 0947 .  nitroGLYCERIN (NITROSTAT) SL tablet 0.4 mg, 0.4 mg, Sublingual, Q5 Min x 3 PRN, Jules Husbands, MD .  ondansetron [facility]) injection 4 mg, 4 mg, Intravenous, Q6H PRN, Jules Husbands, MD .  pantoprazole (PROTONIX) EC tablet 40 mg, 40 mg, Oral, Daily, Jules Husbands, MD, 40 mg at 03/21/2016 0948 .  sodium chloride flush (NS) 0.9 % injection 3 mL, 3 mL, Intravenous, Q12H, Jules Husbands, MD, 3 mL at 03/23/2016 1000 .  sodium chloride flush (NS) 0.9 % injection 3 mL, 3 mL, Intravenous, PRN, Jules Husbands, MD .  sodium chloride flush (NS) 0.9 % injection 3 mL, 3 mL, Intravenous, Q12H, Troy Sine, MD, 3 mL at 03/26/2016 1000 .  sodium chloride flush (NS) 0.9 % injection 3 mL, 3 mL, Intravenous, PRN, Troy Sine, MD .  zolpidem [facility]) tablet 2.5 mg, 2.5 mg, Oral, QHS PRN, Alphia Moh, MD  Patients Current Diet: Diet heart healthy/carb modified Room service appropriate?: Yes; Fluid consistency:: Thin  Precautions / Restrictions Precautions Precautions: Fall, Other (comment) Precaution Comments: 5 falls in past year, has been weak  since March 2016 with coloscopy; L visual field cut - WATCH HR Restrictions Weight Bearing Restrictions: No   Has the patient had 2 or more falls or a fall with injury in the past year?Yes.  Patient and wife report 5-7 falls in the past year, but no injury.  Prior Activity Level Community (5-7x/wk): Went out daily, was driving  Development worker, international aid / Paramedic Devices/Equipment: None, Dentures (specify type), CPAP Home Equipment: Kasandra Knudsen - single point, [employer] - quad, Environmental consultant - standard, Environmental consultant - 2 wheels, Walker - 4 wheels, Bedside commode, Shower seat  Prior Device Use: Indicate devices/aids used by the patient prior to current illness, exacerbation or injury? None  Prior Functional Level Prior Function Level of Independence: Independent  Self Care: Did the patient need help bathing, dressing, using the toilet or eating?  Independent  Indoor Mobility: Did the patient  need assistance with walking from room to room (with or without device)? Independent  Stairs: Did the patient need assistance with internal or external stairs (with or without device)? Independent  Functional Cognition: Did the patient need help planning regular tasks such as shopping or remembering to take medications? Independent  Current Functional Level Cognition  Overall Cognitive Status: Impaired/Different from baseline Orientation Level: Oriented to person, Disoriented to situation, Disoriented to time, Oriented to place Following Commands: Follows one step commands with increased time General Comments: Cues to turn head to the Lt to avoid walking into obstacles.      Extremity Assessment (includes Sensation/Coordination)  Upper Extremity Assessment: RUE deficits/detail, LUE deficits/detail RUE Deficits / Details: overall strength grossly 4/5 shoulder, elbow but difficulty with following commands for MMT. Wrist 3+/5 and grip is fair. Pt did reach out with each UE to retrieve washcloth from  therapist with accurate reach for target. RUE Coordination: decreased fine motor, decreased gross motor LUE Deficits / Details: same as above.  LUE Coordination: decreased fine motor, decreased gross motor  Lower Extremity Assessment: Overall WFL for tasks assessed (4/5 B knee/ankle with manual muscle testing, functionally seems weaker on L (unable to advance LLE with ambulation) and neglects L side. L visual field cut per OT. )    ADLs  Overall ADL's : Needs assistance/impaired Eating/Feeding: Sitting, Set up Eating/Feeding Details (indicate cue type and reason): per wife report  Grooming: Set up, Supervision/safety, Sitting Upper Body Bathing: Moderate assistance, Sitting Lower Body Bathing: Moderate assistance, Sit to/from stand Upper Body Dressing : Moderate assistance, Sitting Lower Body Dressing: Maximal assistance, Sit to/from stand Toilet Transfer: Moderate assistance, Stand-pivot, BSC Toilet Transfer Details (indicate cue type and reason): simulated  Functional mobility during ADLs: Moderate assistance, Cueing for safety (stand pivot with no AD) General ADL Comments: Pt found supine in bed and drousy upon OT entering room. Wife present at bedside. Pt with decreased initiation, but awake and alert once seated EOB. Pt able to follow commands for MMT today. Pt with right lateral lean in sitting and required multimodal cueing to stay at baseline. Pt performed sit to stand with mod assist and stand pivot transfer with mod assist and multimodal cueing for technique and sequencing during transfer.     Mobility  Overal bed mobility: Needs Assistance Bed Mobility: Supine to Sit Rolling: Min assist Sidelying to sit: Min assist Supine to sit: Min assist, HOB elevated Sit to supine: Min assist General bed mobility comments: Min assist to elevate trunk. Pt w/ heavy use of bed rail w/ increased time and effot.    Transfers  Overall transfer level: Needs assistance Equipment used: Rolling  walker (2 wheeled) Transfers: Sit to/from Stand Sit to Stand: Min assist, Mod assist Stand pivot transfers: Mod assist General transfer comment: Sit>stand x (from bed, chair) w/ increasing need of assist.  Pt slow to stand and requires assist to boost and steady.  Cues for hand placement.    Ambulation / Gait / Stairs / Wheelchair Mobility  Ambulation/Gait Ambulation/Gait assistance: Min assist, +2 safety/equipment Ambulation Distance (Feet): 85 Feet (1 seated rest break.  4f, 35 ft) Assistive device: Rolling walker (2 wheeled) Gait Pattern/deviations: Step-to pattern, Decreased stride length, Shuffle, Decreased weight shift to left, Narrow base of support General Gait Details: Lt LE demonstrated improved endurance today but continues to demonstrate poor foot clearance Bil.  Improved managment of RW w/ min assist at times to avoid obstacles in Lt visual field, cues to attent to Lt.  One  seated rest break after ambulating 50 ft.   Gait velocity interpretation: <1.8 ft/sec, indicative of risk for recurrent falls    Posture / Balance Dynamic Sitting Balance Sitting balance - Comments: UE support needed sitting EOB.  Pt leans to the Rt, pillow propped at end of session. Balance Overall balance assessment: Needs assistance Sitting-balance support: No upper extremity supported, Feet supported Sitting balance-Leahy Scale: Poor Sitting balance - Comments: UE support needed sitting EOB.  Pt leans to the Rt, pillow propped at end of session. Postural control: Right lateral lean Standing balance support: Bilateral upper extremity supported, During functional activity Standing balance-Leahy Scale: Poor Standing balance comment: Relies on RW and outside assist for support    Special needs/care consideration BiPAP/CPAP Yes CPM No Continuous Drip IV No Dialysis No     Life Vest No Oxygen No Special Bed No Trach Size No Wound Vac (area) No      Skin Scrapes on elbow and hands                               Bowel mgmt: Last BM 03/07/16 Bladder mgmt: Voiding in urinal Diabetic mgmt Yes, on both oral medications and insulin at home    Previous Home Environment Living Arrangements: Spouse/significant other Available Help at Discharge: Family Type of Home: House Home Layout: Laundry or work area in basement Home Access: Stairs to enter Technical brewer of Steps: 1 [place]: No  Discharge Living Setting Plans for Discharge Living Setting: Patient's home, House, Lives with (comment) (Lives with wife of 30 years.) Type of Home at Discharge: House Discharge Home Layout: Two level, Laundry or work area in basement, Able to live on main level with bedroom/bathroom (Does not have to go down to basement.) Alternate Level Stairs-Number of Steps: Flight Discharge Home Access: Stairs to enter Technical brewer of Steps: 1 step entry Does the patient have any problems obtaining your medications?: No  Social/Family/Support Systems Patient Roles: Spouse, Parent (Has a wife and 4 children, wife has 3 more children.) Contact Information: Kielan Dreisbach - wife Anticipated Caregiver: wife Anticipated Caregiver's Contact Information: Margaretha Sheffield - wife (h) [phone] (c) [phone] Ability/Limitations of Caregiver: wife can assist. Caregiver Availability: 24/7 Discharge Plan Discussed with Primary Caregiver: Yes Is Caregiver In Agreement with Plan?: Yes Does Caregiver/Family have Issues with Lodging/Transportation while Pt is in Rehab?: No  Goals/Additional Needs Patient/Family Goal for Rehab: PT/OT/St Supervision goals Expected length of stay: 10-14 days Cultural Considerations: [employer] Dietary Needs: Heart healthy, carb modified, thin liquids Equipment Needs: TBD Pt/Family Agrees to Admission and willing to participate: Yes Program Orientation Provided & Reviewed with Pt/Caregiver Including Roles  & Responsibilities: Yes  Decrease burden of Care through IP rehab  admission: N/A  Possible need for SNF placement upon discharge: Not planned  Patient Condition: This patient's condition remains as documented in the consult dated 03/08/16, in which the Rehabilitation Physician determined and documented that the patient's condition is appropriate for intensive rehabilitative care in an inpatient rehabilitation facility. Will admit to inpatient rehab today.  Preadmission Screen Completed By:  Retta Diones, 03/24/2016 3:20 PM ______________________________________________________________________   Discussed status with Dr. Letta Pate on 04/03/2016 at 1520 and received telephone approval for admission today.  Admission Coordinator:  Retta Diones, time1520/Date05/04/17

## 2016-03-10 ENCOUNTER — Inpatient Hospital Stay (HOSPITAL_COMMUNITY): Payer: Medicare Other | Admitting: Physical Therapy

## 2016-03-10 ENCOUNTER — Inpatient Hospital Stay (HOSPITAL_COMMUNITY): Payer: Medicare Other | Admitting: Speech Pathology

## 2016-03-10 ENCOUNTER — Inpatient Hospital Stay (HOSPITAL_COMMUNITY): Payer: Medicare Other | Admitting: Occupational Therapy

## 2016-03-10 DIAGNOSIS — R269 Unspecified abnormalities of gait and mobility: Secondary | ICD-10-CM

## 2016-03-10 DIAGNOSIS — I69398 Other sequelae of cerebral infarction: Secondary | ICD-10-CM

## 2016-03-10 DIAGNOSIS — I69319 Unspecified symptoms and signs involving cognitive functions following cerebral infarction: Secondary | ICD-10-CM

## 2016-03-10 DIAGNOSIS — I441 Atrioventricular block, second degree: Secondary | ICD-10-CM | POA: Insufficient documentation

## 2016-03-10 DIAGNOSIS — I63431 Cerebral infarction due to embolism of right posterior cerebral artery: Secondary | ICD-10-CM

## 2016-03-10 LAB — COMPREHENSIVE METABOLIC PANEL
ALBUMIN: 2.1 g/dL — AB (ref 3.5–5.0)
ALT: 21 U/L (ref 17–63)
AST: 28 U/L (ref 15–41)
Alkaline Phosphatase: 62 U/L (ref 38–126)
Anion gap: 14 (ref 5–15)
BILIRUBIN TOTAL: 0.9 mg/dL (ref 0.3–1.2)
BUN: 31 mg/dL — AB (ref 6–20)
CO2: 25 mmol/L (ref 22–32)
CREATININE: 1.79 mg/dL — AB (ref 0.61–1.24)
Calcium: 8.4 mg/dL — ABNORMAL LOW (ref 8.9–10.3)
Chloride: 99 mmol/L — ABNORMAL LOW (ref 101–111)
GFR, EST AFRICAN AMERICAN: 41 mL/min — AB (ref >60)
GFR, EST NON AFRICAN AMERICAN: 35 mL/min — AB (ref >60)
Glucose, Bld: 137 mg/dL — ABNORMAL HIGH (ref 65–99)
POTASSIUM: 4 mmol/L (ref 3.5–5.1)
Sodium: 138 mmol/L (ref 135–145)
Total Protein: 5.4 g/dL — ABNORMAL LOW (ref 6.5–8.1)

## 2016-03-10 LAB — GLUCOSE, CAPILLARY
GLUCOSE-CAPILLARY: 135 mg/dL — AB (ref 65–99)
GLUCOSE-CAPILLARY: 299 mg/dL — AB (ref 65–99)
Glucose-Capillary: 286 mg/dL — ABNORMAL HIGH (ref 65–99)
Glucose-Capillary: 308 mg/dL — ABNORMAL HIGH (ref 65–99)

## 2016-03-10 LAB — CBC WITH DIFFERENTIAL/PLATELET
BASOS ABS: 0 10*3/uL (ref 0.0–0.1)
BASOS PCT: 0 %
Eosinophils Absolute: 0.1 10*3/uL (ref 0.0–0.7)
Eosinophils Relative: 2 %
HEMATOCRIT: 29.5 % — AB (ref 39.0–52.0)
HEMOGLOBIN: 9.8 g/dL — AB (ref 13.0–17.0)
Lymphocytes Relative: 10 %
Lymphs Abs: 0.7 10*3/uL (ref 0.7–4.0)
MCH: 28.2 pg (ref 26.0–34.0)
MCHC: 33.2 g/dL (ref 30.0–36.0)
MCV: 84.8 fL (ref 78.0–100.0)
Monocytes Absolute: 0.6 10*3/uL (ref 0.1–1.0)
Monocytes Relative: 8 %
NEUTROS ABS: 5.6 10*3/uL (ref 1.7–7.7)
NEUTROS PCT: 80 %
Platelets: 233 10*3/uL (ref 150–400)
RBC: 3.48 MIL/uL — AB (ref 4.22–5.81)
RDW: 15.8 % — AB (ref 11.5–15.5)
WBC: 7 10*3/uL (ref 4.0–10.5)

## 2016-03-10 MED ORDER — TRAZODONE HCL 50 MG PO TABS
50.0000 mg | ORAL_TABLET | Freq: Every evening | ORAL | Status: DC | PRN
  Administered 2016-03-10: 50 mg via ORAL
  Administered 2016-03-11 – 2016-03-12 (×2): 25 mg via ORAL
  Administered 2016-03-15: 50 mg via ORAL
  Filled 2016-03-10 (×4): qty 1

## 2016-03-10 NOTE — Progress Notes (Signed)
Social Work  Social Work Assessment and Plan  Patient Details  Name: Carlos Wolf MRN: [id number] Date of Birth: 1941/06/21  Today's Date: 03/10/2016  Problem List:  Patient Active Problem List   Diagnosis Date Noted  . Gait disturbance, post-stroke 03/10/2016  . Cognitive deficit, post-stroke 03/10/2016  . Mobitz type 2 second degree heart block   . 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 Complete heart block ([place])   . Stroke ([place])   . Stroke (cerebrum) ([place])   . Altered mental state   . Unstable angina ([place])   . NSTEMI (non-ST elevated myocardial infarction) ([place]) 03/02/2016  . Dyslipidemia 03/02/2016  . Weakness 01/13/2015  . Multiple falls 01/13/2015  . Physical deconditioning 01/13/2015  . Essential hypertension 01/13/2015  . GERD (gastroesophageal reflux disease) 01/13/2015  . Diabetes mellitus ([place]) 02/04/2013  . Rectal cancer ([place]) 01/16/2013  . Benign neoplasm of colon 01/16/2013  . Guaiac positive stools 11/28/2012  . Melena 11/28/2012  . High cholesterol   . Acid reflux   . Skin cancer    Past Medical History:  Past Medical History  Diagnosis Date  . Hypertension   . High cholesterol   . Diabetes mellitus   . Acid reflux   . Occult GI bleeding     per patient blood in rectum my only problem  . Skin cancer     to nose  . Diabetes mellitus ([place]) 02/04/2013  . Colon cancer [facility])    Past Surgical History:  Past Surgical History  Procedure Laterality Date  . Cardiac surgery    . Tonsillectomy    . Skin cancer excision      nose  . 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 Circumcision  10/24/2011    Procedure: CIRCUMCISION ADULT;  Surgeon: Marissa Nestle;  Location: [facility] OR[facility];  Service: Urology;  Laterality: N/A;  with repair of glans penis  . Cystoscopy  10/24/2011    Procedure: CYSTOSCOPY FLEXIBLE;  Surgeon: Marissa Nestle;  Location: [facility] ORS;  Service: Urology;;  . Esophagogastroduodenoscopy  N/A 12/13/2012    Procedure: ESOPHAGOGASTRODUODENOSCOPY (EGD);  Surgeon: Rogene Houston, MD;  Location: [facility] END[facility];  Service: Endoscopy;  Laterality: N/A;  . Flexible sigmoidoscopy N/A 12/13/2012    Procedure: FLEXIBLE SIGMOIDOSCOPY;  Surgeon: Rogene Houston, MD;  Location: [facility];  Service: Endoscopy;  Laterality: N/A;  . Colonoscopy N/A 12/27/2012    Procedure: COLONOSCOPY;  Surgeon: Rogene Houston, MD;  Location: [facility] SU[facility];  Service: Endoscopy;  Laterality: N/A;  730-rescheduled to 200 Ann to notify pt  . Eus N/A 01/16/2013    Procedure: LOWER ENDOSCOPIC ULTRASOUND (EUS) radial only, 60 minutes, staging of rectal cancer, colonoscopy to follow;  Surgeon: Milus Banister, MD;  Location: [facility] END[facility];  Service: Endoscopy;  Laterality: N/A;  . Colonoscopy N/A 01/16/2013    Procedure: COLONOSCOPY;  Surgeon: Milus Banister, MD;  Location: [facility] END[facility];  Service: Endoscopy;  Laterality: N/A;  . Cataract extraction Right   . Hemicolectomy    . Cardiac catheterization N/A 03/03/2016    Procedure: Left Heart Cath and Cors/Grafts Angiography;  Surgeon: Troy Sine, MD;  Location: [facility];  Service: Cardiovascular;  Laterality: N/A;   Social History:  reports that he has never smoked. He has never used smokeless tobacco. He reports that he does not drink alcohol or use illicit drugs.  Family / [place] Marital  Status: Married How Long?: 30 years Patient Roles: Spouse, Parent Spouse/Significant OtherMargaretha Sheffield [phone] Children: Benedetto Goad [id number]  Myrtice Lauth [phone] Other Supports: Other children they have seven between them Anticipated Caregiver: Wife Ability/Limitations of Caregiver: no limitations Caregiver Availability: 24/7 Family Dynamics: Close knit family pt has four children and wife has three children, all grew up together. They have good social supports between family, friends and  church members. Wife went home to get some rest had been staying in the hospital with pt.  Social History Preferred language: English Religion: [employer] Cultural Background: No issues Education: [employer] Read: Yes Write: Yes Employment Status: Retired Freight forwarder Issues: No issues Guardian/Conservator: None-according to MD pt is capable of making his own decisions while here.   Abuse/Neglect Physical Abuse: Denies Verbal Abuse: Denies Sexual Abuse: Denies Exploitation of patient/patient's resources: Denies Self-Neglect: Denies  Emotional Status Pt's affect, behavior adn adjustment status: Pt is motivated to do well, but is exhausted from his am therapies, he needs a rest in bed. He reports he has been weak since coloscopy not sure why. He wants to get back his strength and be independent again. He was prior to this stroke. Recent Psychosocial Issues: other health issues-hx colon cancer and cardiac issues Pyschiatric History: No history deferred depression screening due to tired from therapies, he seems to be doing/coping appropriately. Will monitor and have neuro-psych see while here if necessary. Substance Abuse History: No issues  Patient / Family Perceptions, Expectations & Goals Pt/Family understanding of illness & functional limitations: Pt and wife have a good understanding of his stroke and deficits, he has progressed since his cath and feels good about this. They do talk with the MD and feel their questions have been answered and addressed. Premorbid pt/family roles/activities: husband, father, grandfather, retiree, church member, etc Anticipated changes in roles/activities/participation: resume Pt/family expectations/goals: Pt states: " I want to take care of myself when I leave here, like I was doing before this."  Wife states: " I hope he does well here, he wants to do for himself."  [employer]: None Premorbid Home Care/DME  Agencies: Other (Comment) (PT last summer) Transportation available at discharge: wife Resource referrals recommended: Support group (specify)  Discharge Planning Living Arrangements: Spouse/significant other Support Systems: Spouse/significant other, Children, Other relatives, Friends/neighbors, Church/faith community Type of Residence: Private residence Insurance Resources: Multimedia programmer (specify) Automotive engineer) Financial Resources: Radio broadcast assistant Screen Referred: No Living Expenses: Own Money Management: Spouse, Patient Does the patient have any problems obtaining your medications?: No Home Management: Wife Patient/Family Preliminary Plans: Return home with wife who is able to assist him if necessary. He wants to be mod/i before going home. He didn't realize how weak he is and wants to work on this while here. Wife went home to get some rest which is good. Social Work Anticipated Follow Up Needs: HH/OP, Support Group  Clinical Impression Pleasant tired gentleman who did not realize how weak he is from his procedures and stroke. His wife is involved and will assist him at home. She plans to be here and observe him in therapies throughout his stay. Pt should do well, team setting mod/i level goals. Will work toward discharge needs.  Elease Hashimoto 03/10/2016, 1:03 PM

## 2016-03-10 NOTE — Progress Notes (Addendum)
   We will continue to monitor second-degree heart block while on rehabilitation.  I have ordered daily EKG tracings.  If second-degree heart block persists greater than 10-14 days out from his inferior infarct, he will need permanent pacemaker therapy.

## 2016-03-10 NOTE — Care Management Note (Signed)
[facility] Individual Statement of Services  Patient Name:  Carlos Wolf  Date:  03/10/2016  Welcome to the [facility].  Our goal is to provide you with an individualized program based on your diagnosis and situation, designed to meet your specific needs.  With this comprehensive rehabilitation program, you will be expected to participate in at least 3 hours of rehabilitation therapies Monday-Friday, with modified therapy programming on the weekends.  Your rehabilitation program will include the following services:  Physical Therapy (PT), Occupational Therapy (OT), Speech Therapy (ST), 24 hour per day rehabilitation nursing, Therapeutic Recreaction (TR), Case Management (Social Worker), Rehabilitation Medicine, Nutrition Services and Pharmacy Services  Weekly team conferences will be held on Wednesday to discuss your progress.  Your Social Worker will talk with you frequently to get your input and to update you on team discussions.  Team conferences with you and your family in attendance may also be held.  Expected length of stay: 10-14 days   Overall anticipated outcome: mod/i level  Depending on your progress and recovery, your program may change. Your Social Worker will coordinate services and will keep you informed of any changes. Your Social Worker's name and contact numbers are listed  below.  The following services may also be recommended but are not provided by the [facility] will be made to provide these services after discharge if needed.  Arrangements include referral to agencies that provide these services.  Your insurance has been verified to be:  BCBS-Medicare Your primary doctor is:  Allyn Kenner  Pertinent information will be shared with your doctor and your insurance company.  Social Worker:  Ovidio Kin, [place] or ([phone]  Information discussed with and copy given to patient by: Elease Hashimoto, 03/10/2016, 11:26 AM

## 2016-03-10 NOTE — Evaluation (Signed)
Physical Therapy Assessment and Plan  Patient Details  Name: SAUD BAIL MRN: [id number] Date of Birth: [birth year]  PT Diagnosis: Abnormality of gait, Cognitive deficits, Difficulty walking, Hemiparesis non-dominant and Muscle weakness Rehab Potential: Good ELOS: 12-14 days   Today's Date: 03/10/2016 PT Individual Time: 0858-1000 PT Individual Time Calculation (min): 62 min    Problem List:  Patient Active Problem List   Diagnosis Date Noted  . Gait disturbance, post-stroke 03/10/2016  . Cognitive deficit, post-stroke 03/10/2016  . Mobitz type 2 second degree heart block   . 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 Complete heart block ([place])   . Stroke ([place])   . Stroke (cerebrum) ([place])   . Altered mental state   . Unstable angina ([place])   . NSTEMI (non-ST elevated myocardial infarction) ([place]) 03/02/2016  . Dyslipidemia 03/02/2016  . Weakness 01/13/2015  . Multiple falls 01/13/2015  . Physical deconditioning 01/13/2015  . Essential hypertension 01/13/2015  . GERD (gastroesophageal reflux disease) 01/13/2015  . Diabetes mellitus ([place]) 02/04/2013  . Rectal cancer ([place]) 01/16/2013  . Benign neoplasm of colon 01/16/2013  . Guaiac positive stools 11/28/2012  . Melena 11/28/2012  . High cholesterol   . Acid reflux   . Skin cancer     Past Medical History:  Past Medical History  Diagnosis Date  . Hypertension   . High cholesterol   . Diabetes mellitus   . Acid reflux   . Occult GI bleeding     per patient blood in rectum my only problem  . Skin cancer     to nose  . Diabetes mellitus ([place]) 02/04/2013  . Colon cancer [facility])    Past Surgical History:  Past Surgical History  Procedure Laterality Date  . Cardiac surgery    . Tonsillectomy    . Skin cancer excision      nose  . 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 Circumcision  10/24/2011    Procedure: CIRCUMCISION ADULT;  Surgeon: Marissa Nestle;  Location:  [facility];  Service: Urology;  Laterality: N/A;  with repair of glans penis  . Cystoscopy  10/24/2011    Procedure: CYSTOSCOPY FLEXIBLE;  Surgeon: Marissa Nestle;  Location: [facility];  Service: Urology;;  . Esophagogastroduodenoscopy N/A 12/13/2012    Procedure: ESOPHAGOGASTRODUODENOSCOPY (EGD);  Surgeon: Rogene Houston, MD;  Location: [facility];  Service: Endoscopy;  Laterality: N/A;  . Flexible sigmoidoscopy N/A 12/13/2012    Procedure: FLEXIBLE SIGMOIDOSCOPY;  Surgeon: Rogene Houston, MD;  Location: [facility];  Service: Endoscopy;  Laterality: N/A;  . Colonoscopy N/A 12/27/2012    Procedure: COLONOSCOPY;  Surgeon: Rogene Houston, MD;  Location: [facility];  Service: Endoscopy;  Laterality: N/A;  730-rescheduled to 200 Ann to notify pt  . Eus N/A 01/16/2013    Procedure: LOWER ENDOSCOPIC ULTRASOUND (EUS) radial only, 60 minutes, staging of rectal cancer, colonoscopy to follow;  Surgeon: Milus Banister, MD;  Location: [facility];  Service: Endoscopy;  Laterality: N/A;  . Colonoscopy N/A 01/16/2013    Procedure: COLONOSCOPY;  Surgeon: Milus Banister, MD;  Location: [facility];  Service: Endoscopy;  Laterality: N/A;  . Cataract extraction Right   . Hemicolectomy    . Cardiac catheterization N/A 03/03/2016    Procedure: Left Heart Cath and Cors/Grafts Angiography;  Surgeon: Troy Sine, MD;  Location: [place] [facility];  Service: Cardiovascular;  Laterality: N/A;  Assessment & Plan Clinical Impression: SONYA PUCCI is a 75 y.o. right handed male with history of HTN, T2DM, CAD s/p CABG, CKD, colon cancer who was admitted on 03/02/2016 with Chest pain. EKG with ST changes and elevated cardiac enzymes noted and he was started on IV heparin for NSTEMI. 2 D echo with EF 40-45% with severe hypokinesis of basal-midinferolateral, inferior and inferoseptal myocardium with grade 2 diastolic dysfunction. He underwent cardiac cath on 04/28 and post procedure developed confusion with  agitation, slurred speech and visual deficits. 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 MRI brain showed acute small PCA infarcts affecting right occipital lobe, corpus callosum and pons. EEG without evidence of seizure activity. Dr. Leonie Man recommended continuing ASA/Plavix for non dominant embolic infarcts due to heart catheterization. Carotid dopplers without significant ICA stenosis. On 05/01 he developed CHB with new LBB and Dr. Rayann Heman was consulted for input. Metoprolol discontinued and heart block resolved. Dr. Tamala Julian recommends avoiding medications that affect AV node and monitor for now. Patient transferred to CIR on 03/28/2016 .   Patient currently requires min with mobility secondary to muscle weakness, decreased cardiorespiratoy endurance, decreased motor planning, field cut, decreased attention to left, decreased initiation, decreased awareness, decreased problem solving, decreased safety awareness, decreased memory and delayed processing and decreased standing balance, decreased postural control and decreased balance strategies.  Prior to hospitalization, patient was independent  with mobility and lived with Spouse in a House home.  Home access is 1Stairs to enter.  Patient will benefit from skilled PT intervention to maximize safe functional mobility, minimize fall risk and decrease caregiver burden for planned discharge home with 24 hour supervision.  Anticipate patient will benefit from follow up [place] at discharge.  PT - End of Session Activity Tolerance: Tolerates 30+ min activity with multiple rests Endurance Deficit: Yes Endurance Deficit Description: requires rest breaks after all mobility tasks PT Assessment Rehab Potential (ACUTE/IP ONLY): Good PT Patient demonstrates impairments in the following area(s): Balance;Perception;Safety;Sensory;Endurance;Motor PT Transfers Functional Problem(s): Bed  Mobility;Bed to Chair;Car;Furniture;Floor PT Locomotion Functional Problem(s): Ambulation;Wheelchair Mobility;Stairs PT Plan PT Intensity: Minimum of 1-2 x/day ,45 to 90 minutes PT Frequency: 5 out of 7 days PT Duration Estimated Length of Stay: 12-14 days PT Treatment/Interventions: Ambulation/gait training;Balance/vestibular training;Community reintegration;Cognitive remediation/compensation;Discharge planning;Neuromuscular re-education;Functional mobility training;Patient/family education;Psychosocial support;Stair training;Therapeutic Exercise;Therapeutic Activities;UE/LE Strength taining/ROM;UE/LE Coordination activities;Wheelchair propulsion/positioning;Visual/perceptual remediation/compensation;Disease management/prevention PT Transfers Anticipated Outcome(s): mod I PT Locomotion Anticipated Outcome(s): mod I in home, S community ambulation and stairs PT Recommendation Follow Up Recommendations: Home health PT Patient destination: Home Equipment Recommended: To be determined  Skilled Therapeutic Intervention Pt received supine in bed, denies pain and agreeable to treatment. Wife present initially then left to go home. Initial PT evaluation performed and completed with minA overall. Pt with flat affect, impaired memory/recall, and decreased awareness of deficits. Responds well to cueing for gait training to improve speed and safety, however poor carryover to later trials. Educated pt in rehab process, goals, estimated LOS, and falls prevention safety with use of call bell for transfers and use of restroom. Pt remained seated in recliner at completion of session, quick release belt intact and all needs within reach at completion of session.   PT Evaluation Precautions/Restrictions Precautions Precautions: Fall Precaution Comments: 5 falls in past year, has been weak since March 2016 with coloscopy; L visual field cut - WATCH HR Restrictions Weight Bearing Restrictions: No General Chart  Reviewed: Yes Response to Previous  Treatment: Not applicable Family/Caregiver Present: Yes (wife present initially, but then left to go home)  Pain Pain Assessment Pain Assessment: No/denies pain Home Living/Prior Functioning Home Living Available Help at Discharge: Family;Available 24 hours/day Type of Home: House Home Access: Stairs to enter [employer] of Steps: 1 Entrance Stairs-Rails: None Home Layout: Laundry or work area in basement;Able to live on main level with bedroom/bathroom Bathroom Shower/Tub: Gaffer (has a Landscape architect and a Civil engineer, contracting) Biochemist, clinical: Standard  Lives With: Spouse Prior Function Level of Independence: Independent with basic ADLs;Independent with homemaking with ambulation;Independent with gait;Independent with transfers  Able to Take Stairs?: Yes Vocation: Retired Vision/Perception  Vision - Risk analyst: Within Functional Limits Ocular Range of Motion: Within Functional Limits Alignment/Gaze Preference: Within Defined Limits Tracking/Visual Pursuits: Requires cues, head turns, or add eye shifts to track;Unable to hold eye position out of midline Saccades: Additional head turns occurred during testing Additional Comments: pt unable to identify items in Lt upper and lower quadrants during confrontation testing also with delayed reaction to solo visual stimulus in Lt quadrant Praxis Praxis-Other Comments: delayed processing  Cognition Overall Cognitive Status: Impaired/Different from baseline Arousal/Alertness: Awake/alert Orientation Level: Oriented X4 Attention: Sustained Sustained Attention: Appears intact Memory: Impaired Memory Impairment: Decreased recall of new information Awareness: Impaired Awareness Impairment: Emergent impairment Safety/Judgment: Impaired Sensation Sensation Light Touch: Appears Intact Stereognosis: Not tested Hot/Cold: Not tested Proprioception: Impaired by gross  assessment Coordination Gross Motor Movements are Fluid and Coordinated: Yes Fine Motor Movements are Fluid and Coordinated: No Finger Nose Finger Test: mild overshooting on Rt; slow bilaterally Heel Shin Test: WFL BLE, slow speed and decreased excursion Motor  Motor Motor: Hemiplegia;Motor apraxia  Mobility Bed Mobility Bed Mobility: Rolling Left;Rolling Right Rolling Right: 5: Supervision Rolling Right Details: Verbal cues for technique Rolling Left: 5: Supervision Rolling Left Details: Verbal cues for technique Transfers Transfers: Yes Stand Pivot Transfers: 4: Min assist Stand Pivot Transfer Details: Tactile cues for weight shifting;Tactile cues for placement;Verbal cues for precautions/safety;Verbal cues for gait pattern Locomotion  Ambulation Ambulation: Yes Ambulation/Gait Assistance: 4: Min assist Ambulation Distance (Feet): 150 Feet Assistive device: 1 person hand held assist;Other (Comment) (rail in hallway) Ambulation/Gait Assistance Details: Tactile cues for weight beaing;Tactile cues for weight shifting;Tactile cues for posture;Verbal cues for precautions/safety;Verbal cues for technique;Verbal cues for gait pattern Gait Gait: Yes Gait Pattern: Impaired Gait Pattern: Decreased stride length;Shuffle;Wide base of support;Left foot flat;Right foot flat Gait velocity: decreased for age/gender norms Stairs / Additional Locomotion Stairs: No (NT due to time constraints) Ramp: 4: Min Chemical engineer: Yes Wheelchair Assistance: 5: Investment banker, operational Details: Verbal cues for technique;Verbal cues for Astronomer: Both upper extremities Wheelchair Parts Management: Needs assistance Distance: 150'  Trunk/Postural Assessment  Cervical Assessment Cervical Assessment: Exceptions to [facility] (forward head posture) Thoracic Assessment Thoracic Assessment: Within Functional Limits Lumbar Assessment Lumbar  Assessment: Within Functional Limits Postural Control Postural Control: Deficits on evaluation (delayed/inefficient righting reactions)  Balance Balance Balance Assessed: Yes Standardized Balance Assessment Standardized Balance Assessment: Berg Balance Test Berg Balance Test Sit to Stand: Needs minimal aid to stand or to stabilize Standing Unsupported: Needs several tries to stand 30 seconds unsupported Sitting with Back Unsupported but Feet Supported on Floor or Stool: Able to sit safely and securely 2 minutes Stand to Sit: Controls descent by using hands Transfers: Needs one person to assist Standing Unsupported with Eyes Closed: Needs help to keep from falling Standing Ubsupported with Feet Together: Needs help to attain  position and unable to hold for 15 seconds From Standing, Reach Forward with Outstretched Arm: Loses balance while trying/requires external support From Standing Position, Pick up Object from Floor: Unable to try/needs assist to keep balance From Standing Position, Turn to Look Behind Over each Shoulder: Needs assist to keep from losing balance and falling Turn 360 Degrees: Needs assistance while turning Standing Unsupported, Alternately Place Feet on Step/Stool: Needs assistance to keep from falling or unable to try Standing Unsupported, One Foot in Front: Loses balance while stepping or standing Standing on One Leg: Unable to try or needs assist to prevent fall Total Score: 10 Static Sitting Balance Static Sitting - Balance Support: No upper extremity supported;Feet supported Static Sitting - Level of Assistance: 5: Stand by assistance Dynamic Sitting Balance Dynamic Sitting - Balance Support: No upper extremity supported;Feet supported Dynamic Sitting - Level of Assistance: 4: Min assist Sitting balance - Comments: UE support for sitting EOB, LOB to R side during LLE MMT Static Standing Balance Static Standing - Balance Support: No upper extremity  supported Static Standing - Level of Assistance: 4: Min assist Static Stance: Eyes closed Static Stance: Eyes Closed: x5 sec before LOB posteriorly Dynamic Standing Balance Dynamic Standing - Balance Support: No upper extremity supported;During functional activity Dynamic Standing - Level of Assistance: 4: Min assist Dynamic Standing - Balance Activities: Forward lean/weight shifting;Lateral lean/weight shifting;Reaching across midline Extremity Assessment  RUE Assessment RUE Assessment: Within Functional Limits (ROM WFL, strength grossly 4 to 4+/5) LUE Assessment LUE Assessment: Within Functional Limits (ROM WFL, strength grossly 4 to 4+/5) RLE Assessment RLE Assessment: Within Functional Limits LLE Assessment LLE Assessment: Exceptions to [facility] (mild strength deficits, grossly 4/5 to 4+/5 throughout)   See Function Navigator for Current Functional Status.   Refer to Care Plan for Long Term Goals  Recommendations for other services: None  Discharge Criteria: Patient will be discharged from PT if patient refuses treatment 3 consecutive times without medical reason, if treatment goals not met, if there is a change in medical status, if patient makes no progress towards goals or if patient is discharged from hospital.  The above assessment, treatment plan, treatment alternatives and goals were discussed and mutually agreed upon: by patient  Luberta Mutter 03/10/2016, 11:43 AM

## 2016-03-10 NOTE — Progress Notes (Signed)
75 y.o. right handed male with history of HTN, T2DM, CAD s/p CABG, CKD, colon cancer who was admitted on 03/02/2016 with Chest pain. EKG with ST changes and elevated cardiac enzymes noted and he was started on IV heparin for NSTEMI. 2 D echo with EF 40-45% with severe hypokinesis of basal-midinferolateral, inferior and inferoseptal myocardium with grade 2 diastolic dysfunction. He underwent cardiac cath on 04/28 and post procedure developed confusion with agitation, slurred speech and visual deficits. 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 MRI brain showed acute small PCA infarcts affecting right occipital lobe, corpus callosum and pons. EEG without evidence of seizure activity  Subjective/Complaints: Slept poorly last noc Wife states pt uses benadryl at home Wife will bring in pt's CPAP set up  today from home  ROS- no pain , has problems tolerating CPAP, no SOB Objective: Vital Signs: Blood pressure 117/39, pulse 52, temperature 98 F (36.7 C), temperature source Oral, resp. rate 18, weight 74.027 kg (163 lb 3.2 oz), SpO2 98 %. No results found. Results for orders placed or performed during the hospital encounter of 03/08/2016 (from the past 72 hour(s))  Glucose, capillary     Status: Abnormal   Collection Time: 04/04/2016  4:52 PM  Result Value Ref Range   Glucose-Capillary 230 (H) 65 - 99 mg/dL  Glucose, capillary     Status: Abnormal   Collection Time: 03/27/2016  8:33 PM  Result Value Ref Range   Glucose-Capillary 298 (H) 65 - 99 mg/dL  CBC WITH DIFFERENTIAL     Status: Abnormal   Collection Time: 03/10/16  5:21 AM  Result Value Ref Range   WBC 7.0 4.0 - 10.5 K/uL   RBC 3.48 (L) 4.22 - 5.81 MIL/uL   Hemoglobin 9.8 (L) 13.0 - 17.0 g/dL   HCT 29.5 (L) 39.0 - 52.0 %   MCV 84.8 78.0 - 100.0 fL   MCH 28.2 26.0 - 34.0 pg   MCHC 33.2 30.0 - 36.0 g/dL   RDW 15.8 (H) 11.5 - 15.5 %   Platelets 233 150 -  400 K/uL   Neutrophils Relative % 80 %   Neutro Abs 5.6 1.7 - 7.7 K/uL   Lymphocytes Relative 10 %   Lymphs Abs 0.7 0.7 - 4.0 K/uL   Monocytes Relative 8 %   Monocytes Absolute 0.6 0.1 - 1.0 K/uL   Eosinophils Relative 2 %   Eosinophils Absolute 0.1 0.0 - 0.7 K/uL   Basophils Relative 0 %   Basophils Absolute 0.0 0.0 - 0.1 K/uL  Comprehensive metabolic panel     Status: Abnormal   Collection Time: 03/10/16  5:21 AM  Result Value Ref Range   Sodium 138 135 - 145 mmol/L   Potassium 4.0 3.5 - 5.1 mmol/L   Chloride 99 (L) 101 - 111 mmol/L   CO2 25 22 - 32 mmol/L   Glucose, Bld 137 (H) 65 - 99 mg/dL   BUN 31 (H) 6 - 20 mg/dL   Creatinine, Ser 1.79 (H) 0.61 - 1.24 mg/dL   Calcium 8.4 (L) 8.9 - 10.3 mg/dL   Total Protein 5.4 (L) 6.5 - 8.1 g/dL   Albumin 2.1 (L) 3.5 - 5.0 g/dL   AST 28 15 - 41 U/L   ALT 21 17 - 63 U/L   Alkaline Phosphatase 62 38 - 126 U/L   Total Bilirubin 0.9 0.3 - 1.2 mg/dL   GFR calc non Af Amer 35 (L) >  60 mL/min   GFR calc Af Amer 41 (L) >60 mL/min    Comment: (NOTE) The eGFR has been calculated using the CKD EPI equation. This calculation has not been validated in all clinical situations. eGFR's persistently <60 mL/min signify possible Chronic Kidney Disease.    Anion gap 14 5 - 15  Glucose, capillary     Status: Abnormal   Collection Time: 03/10/16  7:01 AM  Result Value Ref Range   Glucose-Capillary 135 (H) 65 - 99 mg/dL     HEENT: normal Cardio: RRR and no murmur Resp: CTA B/L and unlabored GI: BS positive and non tender Extremity:  Pulses positive and No Edema Skin:   Intact Neuro: Alert/Oriented,, Abnormal Motor 4/5 in BUE and BLE and Other Left field cut Musc/Skel:  Normal Gen NAD   Assessment/Plan: 1. 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 Physiatrist  is providing close team supervision and 24 hour management of active medical problems listed below. Physiatrist and rehab team continue to assess barriers to discharge/monitor patient progress toward functional and medical goals. FIM:                   Function - Comprehension Comprehension: Auditory Comprehension assist level: Follows basic conversation/direction with no assist  Function - Expression Expression: Verbal Expression assist level: Expresses complex ideas: With no assist  Function - Social Interaction Social Interaction assist level: Interacts appropriately with others with medication or extra time (anti-anxiety, antidepressant).  Function - Problem Solving Problem solving assist level: Solves basic problems with no assist  Function - Memory Memory assist level: Recognizes or recalls 90% of the time/requires cueing < 10% of the time Patient normally able to recall (first 3 days only): Current season, That he or she is in a hospital  Medical Problem List and Plan: 1.  Gait disorder, left homonymous hemianopsia secondary to Right PCA distribution embolic infarcts Initiate CIR                                                                  2.  DVT Prophylaxis/Anticoagulation: Pharmaceutical: Lovenox, plt nl at 233K 3. Pain Management:  Tylenol prn for HA effective.   4. Mood:  Wife has been supportive and present during the stay. LCSW to follow for evaluation and support.   5. Neuropsych: This patient is capable of making decisions on his  own behalf. 6. Skin/Wound Care: Routine pressure relief measures.   7. Fluids/Electrolytes/Nutrition: Monitor I/O check lytes in am. Hx of CKD  BMP Latest Ref Rng 03/10/2016 03/05/2016 03/04/2016  Glucose 65 - 99 mg/dL 137(H) 230(H) 153(H)  BUN 6 - 20 mg/dL 31(H) 25(H) 17  Creatinine 0.61 - 1.24 mg/dL 1.79(H) 2.03(H) 1.49(H)  Sodium 135 - 145 mmol/L 138 136 142  Potassium 3.5 - 5.1 mmol/L 4.0 4.0 4.0  Chloride 101 - 111 mmol/L  99(L) 100(L) 108  CO2 22 - 32 mmol/L _0 Calcium 8.9 - 10.3 mg/dL 8.4(L) 8.2(L) 8.4(L)    8. CAD s/pCABG/NSTEMI: To treat medically. On Imdur, Plavix, ASA and Lipitor. .   9. T2DM:  Hgb A1c-8.4. Will monitor BS  ac/hs. Continue to hold metformin due to renal status.  BS poorly controlled. Will add low dose Lantus and Amaryl.  (Used Tresiba 25 units for BS,200 and 35 units for BS> 200--will ask wife to bring from home.) CBG (last 3)   Recent Labs  03/15/2016 1652 03/22/2016 2033 03/10/16 0701  GLUCAP 230* 298* 135*    10. Acute on chronic renal failure:  BUN/Cr- 23/1.68 at admission--> 25/2.03 post cath.   11. ABLA:  Recheck CBC in am.   LOS (Days) 1 A FACE TO FACE EVALUATION WAS PERFORMED  Marixa Mellott E 03/10/2016, 7:10 AM

## 2016-03-10 NOTE — Progress Notes (Signed)
   Today's ECG reveals Mobitz 1 second degree AV block.

## 2016-03-10 NOTE — Progress Notes (Signed)
Patient has home CPAP machine, mask and tubing. Places self on and off. RT took [facility] CPAP machine out of room.

## 2016-03-10 NOTE — Evaluation (Signed)
Occupational Therapy Assessment and Plan  Patient Details  Name: Carlos Wolf MRN: [id number] Date of Birth: 04-Nov-1941  OT Diagnosis: cognitive deficits, disturbance of vision and muscle weakness (generalized) Rehab Potential: Rehab Potential (ACUTE ONLY): Good ELOS: 10-14 days   Today's Date: 03/10/2016 OT Individual Time: 1050-1200 OT Individual Time Calculation (min): 70 min     Problem List:  Patient Active Problem List   Diagnosis Date Noted  . Gait disturbance, post-stroke 03/10/2016  . Cognitive deficit, post-stroke 03/10/2016  . Mobitz type 2 second degree heart block   . 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 Complete heart block ([place])   . Stroke ([place])   . Stroke (cerebrum) ([place])   . Altered mental state   . Unstable angina ([place])   . NSTEMI (non-ST elevated myocardial infarction) ([place]) 03/02/2016  . Dyslipidemia 03/02/2016  . Weakness 01/13/2015  . Multiple falls 01/13/2015  . Physical deconditioning 01/13/2015  . Essential hypertension 01/13/2015  . GERD (gastroesophageal reflux disease) 01/13/2015  . Diabetes mellitus ([place]) 02/04/2013  . Rectal cancer ([place]) 01/16/2013  . Benign neoplasm of colon 01/16/2013  . Guaiac positive stools 11/28/2012  . Melena 11/28/2012  . High cholesterol   . Acid reflux   . Skin cancer     Past Medical History:  Past Medical History  Diagnosis Date  . Hypertension   . High cholesterol   . Diabetes mellitus   . Acid reflux   . Occult GI bleeding     per patient blood in rectum my only problem  . Skin cancer     to nose  . Diabetes mellitus ([place]) 02/04/2013  . Colon cancer [facility])    Past Surgical History:  Past Surgical History  Procedure Laterality Date  . Cardiac surgery    . Tonsillectomy    . Skin cancer excision      nose  . 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 Circumcision  10/24/2011    Procedure: CIRCUMCISION ADULT;  Surgeon: Marissa Nestle;   Location: [facility];  Service: Urology;  Laterality: N/A;  with repair of glans penis  . Cystoscopy  10/24/2011    Procedure: CYSTOSCOPY FLEXIBLE;  Surgeon: Marissa Nestle;  Location: [facility];  Service: Urology;;  . Esophagogastroduodenoscopy N/A 12/13/2012    Procedure: ESOPHAGOGASTRODUODENOSCOPY (EGD);  Surgeon: Rogene Houston, MD;  Location[facility];  Service: Endoscopy;  Laterality: N/A;  . Flexible sigmoidoscopy N/A 12/13/2012    Procedure: FLEXIBLE SIGMOIDOSCOPY;  Surgeon: Rogene Houston, MD;  [facility];  Service: Endoscopy;  Laterality: N/A;  . Colonoscopy N/A 12/27/2012    Procedure: COLONOSCOPY;  Surgeon: Rogene Houston, MD;  [facility];  Service: Endoscopy;  Laterality: N/A;  730-rescheduled to 200 Ann to notify pt  . Eus N/A 01/16/2013    Procedure: LOWER ENDOSCOPIC ULTRASOUND (EUS) radial only, 60 minutes, staging of rectal cancer, colonoscopy to follow;  Surgeon: Milus Banister, MD;  [facility];  Service: Endoscopy;  Laterality: N/A;  . Colonoscopy N/A 01/16/2013    Procedure: COLONOSCOPY;  Surgeon: Milus Banister, MD;  [facility];  Service: Endoscopy;  Laterality: N/A;  . Cataract extraction Right   . Hemicolectomy    . Cardiac catheterization N/A 03/03/2016    Procedure: Left Heart Cath and Cors/Grafts Angiography;  Surgeon: Troy Sine, MD;  Location: [place] CV LAB;  Service: Cardiovascular;  Laterality: N/A;  Assessment & Plan Clinical Impression: Patient is a 75 y.o. right handed male with history of hypertension, hyperlipidemia, diabetes mellitus, CAD status post CABG 2002 at [facility]. Patient lives with spouse was independent prior to admission using occasional cane. He presented 03/02/2016 with chest discomfort. He was given aspirin in route to the hospital on heparin drip initiated in the ER. EKG abnormal inferior leads but remained chest pain-free. Initial troponin 1.22. Echocardiogram ejection  fraction of 45% severe hypokinesis grade 2 diastolic dysfunction. Underwent cardiac catheterization 03/03/2016 showing severe native CAD with total occlusion of the very proximal LAD and left circumflex coronary arteries and small RCA with 90% proximal stenosis. Recommended medical therapy maintained on aspirin and Plavix. Post catheterization with slurred speech and altered mental status. MRI of the brain showed small acute posterior circulation infarcts in the right occipital lobe, corpus callosum and pons. EEG suggestive of right hemispheric focal cerebral dysfunction. No seizure activity recorded. Subcutaneous Lovenox added for DVT prophylaxis. Close monitoring of bradycardia remained asymptomatic. Tolerating a regular consistency diet. Physical and occupational therapy evaluations completed with recommendations of physical medicine rehabilitation consult.   Patient transferred to CIR on 03/14/2016 .    Patient currently requires mod with basic self-care skills secondary to muscle weakness, decreased cardiorespiratoy endurance, impaired timing and sequencing, decreased coordination and decreased motor planning, hemianopsia, decreased attention to left and left side neglect, decreased initiation, decreased awareness, decreased problem solving, decreased safety awareness, decreased memory and delayed processing and decreased standing balance, decreased postural control and decreased balance strategies.  Prior to hospitalization, patient could complete ADLs with independent .  Patient will benefit from skilled intervention to increase independence with basic self-care skills prior to discharge home with care partner.  Anticipate patient will require 24 hour supervision and follow up outpatient.  OT - End of Session Activity Tolerance: Tolerates 30+ min activity with multiple rests Endurance Deficit: Yes Endurance Deficit Description: requires rest breaks after all mobility tasks OT Assessment Rehab  Potential (ACUTE ONLY): Good OT Patient demonstrates impairments in the following area(s): Balance;Cognition;Endurance;Motor;Perception;Safety;Vision OT Basic ADL's Functional Problem(s): Grooming;Bathing;Dressing;Toileting;Eating OT Transfers Functional Problem(s): Toilet;Tub/Shower OT Additional Impairment(s): Fuctional Use of Upper Extremity OT Plan OT Intensity: Minimum of 1-2 x/day, 45 to 90 minutes OT Frequency: 5 out of 7 days OT Duration/Estimated Length of Stay: 10-14 days OT Treatment/Interventions: Balance/vestibular training;Cognitive remediation/compensation;Discharge planning;Community reintegration;Disease mangement/prevention;DME/adaptive equipment instruction;Functional mobility training;Neuromuscular re-education;Pain management;Patient/family education;Psychosocial support;Self Care/advanced ADL retraining;Therapeutic Activities;Therapeutic Exercise;UE/LE Strength taining/ROM;UE/LE Coordination activities;Visual/perceptual remediation/compensation OT Self Feeding Anticipated Outcome(s): Mod I OT Basic Self-Care Anticipated Outcome(s): Mod I OT Toileting Anticipated Outcome(s): Mod I OT Bathroom Transfers Anticipated Outcome(s): Mod I OT Recommendation Patient destination: Home Follow Up Recommendations: Outpatient OT;24 hour supervision/assistance Equipment Recommended: To be determined   Skilled Therapeutic Intervention OT evaluation completed with discussion of rehab process, OT purpose, POC, ELOS, and goals.  ADL assessment completed with pt dressing at sit > stand level with min-mod assist for standing balance.  Pt declined bathing this session.  Min-mod assist ambulation short distance with hand held assist, pt with decreased steadiness on feet with turns and obstacle negotiation.  Lt homonymous hemianopsia with pt unable to identify items in Lt upper and lower quadrants during confrontation testing also with delayed reaction to solo visual stimulus in Lt quadrant.   Transferred back to bed at end of session due to decreased endurance.  OT Evaluation Precautions/Restrictions  Precautions Precautions: Fall Precaution Comments: 5 falls in past year, has been weak since March 2016 with coloscopy; L  visual field cut - WATCH HR Restrictions Weight Bearing Restrictions: No General   Vital Signs Therapy Vitals Pulse Rate: (!) 57 BP: (!) 127/46 mmHg Oxygen Therapy SpO2: 96 % O2 Device: Not Delivered Pain Pain Assessment Pain Assessment: No/denies pain Home Living/Prior Functioning Home Living Available Help at Discharge: Family, Available 24 hours/day Type of Home: House Home Access: Stairs to enter Technical brewer of Steps: 1 Entrance Stairs-Rails: None Home Layout: Laundry or work area in basement, Able to live on main level with bedroom/bathroom Bathroom Shower/Tub: Gaffer (has a Landscape architect and a shower seat) Biochemist, clinical: Standard  Lives With: Spouse IADL History Homemaking Responsibilities: No Current License: Yes Prior Function Level of Independence: Independent with basic ADLs, Independent with homemaking with ambulation, Independent with gait, Independent with transfers  Able to Take Stairs?: Yes Vocation: Retired ADL  See Function Navigator Vision/Perception  Vision- History Baseline Vision/History: Wears glasses Wears Glasses: At all times Patient Visual Report: Other (comment);Peripheral vision impairment Vision- Assessment Vision Assessment?: Yes;Vision impaired- to be further tested in functional context Eye Alignment: Within Functional Limits Ocular Range of Motion: Within Functional Limits Alignment/Gaze Preference: Within Defined Limits Tracking/Visual Pursuits: Requires cues, head turns, or add eye shifts to track;Unable to hold eye position out of midline Saccades: Additional head turns occurred during testing Visual Fields: Left homonymous hemianopsia Additional Comments: pt unable to identify items  in Lt upper and lower quadrants during confrontation testing also with delayed reaction to solo visual stimulus in Lt quadrant Praxis Praxis-Other Comments: delayed processing  Cognition Overall Cognitive Status: Impaired/Different from baseline Arousal/Alertness: Awake/alert Orientation Level: Person;Place;Situation Person: Oriented Place: Oriented Situation: Oriented Year: 2017 Month: May Day of Week: Incorrect (thursday) Memory: Impaired Memory Impairment: Decreased recall of new information Immediate Memory Recall: Sock;Blue;Bed Memory Recall: Blue;Bed Memory Recall Blue: Without Cue Memory Recall Bed: Without Cue Attention: Sustained Sustained Attention: Appears intact Awareness: Impaired Awareness Impairment: Emergent impairment Safety/Judgment: Impaired Sensation Sensation Light Touch: Appears Intact Stereognosis: Not tested Hot/Cold: Not tested Proprioception: Impaired by gross assessment Coordination Gross Motor Movements are Fluid and Coordinated: Yes Fine Motor Movements are Fluid and Coordinated: No Finger Nose Finger Test: mild overshooting on Rt; slow bilaterally Heel Shin Test: WFL BLE, slow speed and decreased excursion 9 Hole Peg Test: Rt: 1:29:21 and Lt: 1:00:98, dropped 2 pegs on Rt and with increased difficulty manipulating pegs on Rt Motor  Motor Motor: Hemiplegia;Motor apraxia Mobility  Bed Mobility Bed Mobility: Rolling Left;Rolling Right Rolling Right: 5: Supervision Rolling Right Details: Verbal cues for technique Rolling Left: 5: Supervision Rolling Left Details: Verbal cues for technique  Trunk/Postural Assessment  Cervical Assessment Cervical Assessment: Exceptions to [facility] (forward head posture) Thoracic Assessment Thoracic Assessment: Within Functional Limits Lumbar Assessment Lumbar Assessment: Within Functional Limits Postural Control Postural Control: Deficits on evaluation (delayed/inefficient righting reactions)   Balance Balance Balance Assessed: Yes Standardized Balance Assessment Standardized Balance Assessment: Berg Balance Test Berg Balance Test Sit to Stand: Needs minimal aid to stand or to stabilize Standing Unsupported: Needs several tries to stand 30 seconds unsupported Sitting with Back Unsupported but Feet Supported on Floor or Stool: Able to sit safely and securely 2 minutes Stand to Sit: Controls descent by using hands Transfers: Needs one person to assist Standing Unsupported with Eyes Closed: Needs help to keep from falling Standing Ubsupported with Feet Together: Needs help to attain position and unable to hold for 15 seconds From Standing, Reach Forward with Outstretched Arm: Loses balance while trying/requires external support From Standing Position, Pick up  Object from Floor: Unable to try/needs assist to keep balance From Standing Position, Turn to Look Behind Over each Shoulder: Needs assist to keep from losing balance and falling Turn 360 Degrees: Needs assistance while turning Standing Unsupported, Alternately Place Feet on Step/Stool: Needs assistance to keep from falling or unable to try Standing Unsupported, One Foot in Front: Loses balance while stepping or standing Standing on One Leg: Unable to try or needs assist to prevent fall Total Score: 10 Static Sitting Balance Static Sitting - Balance Support: No upper extremity supported;Feet supported Static Sitting - Level of Assistance: 5: Stand by assistance Dynamic Sitting Balance Dynamic Sitting - Balance Support: No upper extremity supported;Feet supported Dynamic Sitting - Level of Assistance: 4: Min assist Sitting balance - Comments: UE support for sitting EOB, LOB to R side during LLE MMT Static Standing Balance Static Standing - Balance Support: No upper extremity supported Static Standing - Level of Assistance: 4: Min assist Static Stance: Eyes closed Static Stance: Eyes Closed: x5 sec before LOB  posteriorly Dynamic Standing Balance Dynamic Standing - Balance Support: No upper extremity supported;During functional activity Dynamic Standing - Level of Assistance: 4: Min assist Dynamic Standing - Balance Activities: Forward lean/weight shifting;Lateral lean/weight shifting;Reaching across midline Extremity/Trunk Assessment RUE Assessment RUE Assessment: Within Functional Limits (ROM WFL, strength grossly 4 to 4+/5) LUE Assessment LUE Assessment: Within Functional Limits (ROM WFL, strength grossly 4 to 4+/5)   See Function Navigator for Current Functional Status.   Refer to Care Plan for Long Term Goals  Recommendations for other services: None  Discharge Criteria: Patient will be discharged from OT if patient refuses treatment 3 consecutive times without medical reason, if treatment goals not met, if there is a change in medical status, if patient makes no progress towards goals or if patient is discharged from hospital.  The above assessment, treatment plan, treatment alternatives and goals were discussed and mutually agreed upon: by patient  Simonne Come 03/10/2016, 12:17 PM

## 2016-03-10 NOTE — Progress Notes (Signed)
Patient information reviewed and entered into eRehab system by Yaneliz Radebaugh, RN, CRRN, PPS Coordinator.  Information including medical coding and functional independence measure will be reviewed and updated through discharge.     Per nursing patient was given "Data Collection Information Summary for Patients in Inpatient Rehabilitation Facilities with attached "Privacy Act Statement-Health Care Records" upon admission.  

## 2016-03-10 NOTE — Evaluation (Signed)
Speech Language Pathology Assessment and Plan  Patient Details  Name: Carlos Wolf MRN: [id number] Date of Birth: 04/11/1941  SLP Diagnosis: Cognitive Impairments  Rehab Potential: Good ELOS: 12-14 days     Today's Date: 03/10/2016 SLP Individual Time: 1300-1400 SLP Individual Time Calculation (min): 60 min   Problem List:  Patient Active Problem List   Diagnosis Date Noted  . Gait disturbance, post-stroke 03/10/2016  . Cognitive deficit, post-stroke 03/10/2016  . Mobitz type 2 second degree heart block   . 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 Complete heart block ([facility])   . Stroke ([facility])   . Stroke (cerebrum) ([facility])   . Altered mental state   . Unstable angina ([facility])   . NSTEMI (non-ST elevated myocardial infarction) ([facility]) 03/02/2016  . Dyslipidemia 03/02/2016  . Weakness 01/13/2015  . Multiple falls 01/13/2015  . Physical deconditioning 01/13/2015  . Essential hypertension 01/13/2015  . GERD (gastroesophageal reflux disease) 01/13/2015  . Diabetes mellitus ([facility]) 02/04/2013  . Rectal cancer ([facility]) 01/16/2013  . Benign neoplasm of colon 01/16/2013  . Guaiac positive stools 11/28/2012  . Melena 11/28/2012  . High cholesterol   . Acid reflux   . Skin cancer    Past Medical History:  Past Medical History  Diagnosis Date  . Hypertension   . High cholesterol   . Diabetes mellitus   . Acid reflux   . Occult GI bleeding     per patient blood in rectum my only problem  . Skin cancer     to nose  . Diabetes mellitus ([facility]) 02/04/2013  . Colon cancer ([facility])    Past Surgical History:  Past Surgical History  Procedure Laterality Date  . Cardiac surgery    . Tonsillectomy    . Skin cancer excision      nose  . 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 Circumcision  10/24/2011    Procedure: CIRCUMCISION ADULT;  Surgeon: Mohammad I Javaid;  Location: [facility];  Service: Urology;  Laterality: N/A;  with repair of glans  penis  . Cystoscopy  10/24/2011    Procedure: CYSTOSCOPY FLEXIBLE;  Surgeon: Mohammad I Javaid;  Location: [facility];  Service: Urology;;  . Esophagogastroduodenoscopy N/A 12/13/2012    Procedure: ESOPHAGOGASTRODUODENOSCOPY (EGD);  Surgeon: [name] U Rehman, MD;  [facility];  Service: Endoscopy;  Laterality: N/A;  . Flexible sigmoidoscopy N/A 12/13/2012    Procedure: FLEXIBLE SIGMOIDOSCOPY;  Surgeon: [name] U Rehman, MD;  [facility];  Service: Endoscopy;  Laterality: N/A;  . Colonoscopy N/A 12/27/2012    Procedure: COLONOSCOPY;  Surgeon: [name] U Rehman, MD;  [facility];  Service: Endoscopy;  Laterality: N/A;  730-rescheduled to 200 Ann to notify pt  . Eus N/A 01/16/2013    Procedure: LOWER ENDOSCOPIC ULTRASOUND (EUS) radial only, 60 minutes, staging of rectal cancer, colonoscopy to follow;  Surgeon: Daniel P Jacobs, MD;  [facility];  Service: Endoscopy;  Laterality: N/A;  . Colonoscopy N/A 01/16/2013    Procedure: COLONOSCOPY;  Surgeon: Daniel P Jacobs, MD;  [facility];  Service: Endoscopy;  Laterality: N/A;  . Cataract extraction Right   . Hemicolectomy    . Cardiac catheterization N/A 03/03/2016    Procedure: Left Heart Cath and Cors/Grafts Angiography;  Surgeon: Nicolai A Kelly, MD;  [facility]: [facility];  Service: Cardiovascular;  Laterality: N/A;    Assessment / Plan / Recommendation Clinical Impression    Carlos Wolf is a 75 y.o. right handed male with history of HTN, T2DM, CAD s/p CABG, CKD, colon cancer who was admitted on 03/02/2016 withChest pain. EKG with ST changes and elevated cardiac enzymes noted and he was started on IV heparin for NSTEMI. He underwent cardiac cath on 04/28 and post procedure developed confusion with agitation, slurred speech and visual deficits. 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 MRI brain showed acute small PCA  infarcts affecting right occipital lobe, corpus callosum and pons.Pt was admitted to CIR on 03/08/2016.  SLP evaluation completed on 03/10/2016 with the following results: Pt presents with mild-moderate cognitive deficits characterized by decreased retrieval of information, decreased intellectual awareness of deficits, and decreased functional problem solving.  Suspect some level of underlying baseline impairment given evidence of severe small vessel disease on MRI.  No family present to verify baseline; however, pt reports that he was driving, handling bills, and managing his own medications prior to admission.  The abovementioned deficits impact pt's ability to complete familiar self care tasks safely and as a result pt would benefit from skilled ST while inpatient in order to maximize functional independence and reduce burden of care prior to discharge.    Skilled Therapeutic Interventions          Cognitive-linguistic evaluation completed with results and recommendations reviewed with patient.     SLP Assessment  Patient will need skilled Speech Lanaguage Pathology Services during CIR admission    Recommendations  Patient destination: Home Follow up Recommendations: Home Health SLP;24 hour supervision/assistance;Outpatient SLP Equipment Recommended: None recommended by SLP    SLP Frequency 3 to 5 out of 7 days   SLP Duration  SLP Intensity  SLP Treatment/Interventions 12-14 days   Minumum of 1-2 x/day, 30 to 90 minutes  Cognitive remediation/compensation;Cueing hierarchy;Functional tasks;Environmental controls;Patient/family education;Internal/external aids    Pain Pain Assessment Pain Assessment: No/denies pain  Prior Functioning Cognitive/Linguistic Baseline: Baseline deficits Baseline deficit details: Cerebral atrophy and extensive chronic small vessel white matter on MRI Type of Home: House  Lives With: Spouse Available Help at Discharge: Family;Available 24  hours/day Education: completed high school  Vocation: Retired  Function:  Eating Eating                 Cognition Comprehension Comprehension assist level: Follows basic conversation/direction with extra time/assistive device  Expression   Expression assist level: Expresses basic needs/ideas: With extra time/assistive device  Social Interaction Social Interaction assist level: Interacts appropriately 75 - 89% of the time - Needs redirection for appropriate language or to initiate interaction.  Problem Solving Problem solving assist level: Solves basic 50 - 74% of the time/requires cueing 25 - 49% of the time  Memory Memory assist level: Recognizes or recalls 50 - 74% of the time/requires cueing 25 - 49% of the time   Short Term Goals: Week 1: SLP Short Term Goal 1 (Week 1): Pt will use external memory aids to facilitate recall of daily information with min assist verbal cues.  SLP Short Term Goal 2 (Week 1): Pt will complete familiar semi-complex tasks with min assist verbal cues for functional problem solving.  SLP Short Term Goal 3 (Week 1): Pt will identify at least 2 deficits s/p CVA wtih min assist question cues.    Refer to Care Plan for Long Term Goals  Recommendations for other services: None  Discharge Criteria: Patient will be discharged from SLP if patient   refuses treatment 3 consecutive times without medical reason, if treatment goals not met, if there is a change in medical status, if patient makes no progress towards goals or if patient is discharged from hospital.  The above assessment, treatment plan, treatment alternatives and goals were discussed and mutually agreed upon: by patient  Page, Nicole L 03/10/2016, 3:58 PM   

## 2016-03-11 ENCOUNTER — Inpatient Hospital Stay (HOSPITAL_COMMUNITY): Payer: Medicare Other | Admitting: Speech Pathology

## 2016-03-11 DIAGNOSIS — E119 Type 2 diabetes mellitus without complications: Secondary | ICD-10-CM

## 2016-03-11 DIAGNOSIS — R000 Tachycardia, unspecified: Secondary | ICD-10-CM

## 2016-03-11 DIAGNOSIS — I441 Atrioventricular block, second degree: Secondary | ICD-10-CM

## 2016-03-11 LAB — GLUCOSE, CAPILLARY
GLUCOSE-CAPILLARY: 195 mg/dL — AB (ref 65–99)
GLUCOSE-CAPILLARY: 228 mg/dL — AB (ref 65–99)
GLUCOSE-CAPILLARY: 366 mg/dL — AB (ref 65–99)
Glucose-Capillary: 245 mg/dL — ABNORMAL HIGH (ref 65–99)

## 2016-03-11 MED ORDER — INSULIN ASPART 100 UNIT/ML ~~LOC~~ SOLN
5.0000 [IU] | Freq: Three times a day (TID) | SUBCUTANEOUS | Status: DC
  Administered 2016-03-15 (×3): 5 [IU] via SUBCUTANEOUS

## 2016-03-11 NOTE — Plan of Care (Signed)
Problem: RH PAIN MANAGEMENT Goal: RH STG PAIN MANAGED AT OR BELOW PT'S PAIN GOAL <3 Outcome: Progressing No c/o pain     

## 2016-03-11 NOTE — IPOC Note (Signed)
Overall Plan of Care [facility]) Patient Details Name: Carlos Wolf MRN: [id number] DOB: [birth year]  Admitting Diagnosis: R CVA  Hospital Problems: Active Problems:   Stroke due to embolism of right posterior cerebral artery (HCC)   Gait disturbance, post-stroke   Cognitive deficit, post-stroke   Mobitz type 2 second degree heart block   Diabetes mellitus type 2 in nonobese (HCC)   Mobitz (type) I (Wenckebach's) atrioventricular block   Tachycardia     Functional Problem List: Nursing Endurance, Medication Management, Perception, Safety, Skin Integrity  PT Balance, Perception, Safety, Sensory, Endurance, Motor  OT Balance, Cognition, Endurance, Motor, Perception, Safety, Vision  SLP Cognition  TR         Basic ADL's: OT Grooming, Bathing, Dressing, Toileting, Eating     Advanced  ADL's: OT       Transfers: PT Bed Mobility, Bed to Chair, Car, Furniture, Futures trader, Metallurgist: PT Ambulation, Emergency planning/management officer, Stairs     Additional Impairments: OT Fuctional Use of Upper Extremity  SLP Social Cognition   Problem Solving, Memory, Awareness  TR      Anticipated Outcomes Item Anticipated Outcome  Self Feeding Mod I  Swallowing      Basic self-care  Mod I  Toileting  Mod I   Bathroom Transfers Mod I  Bowel/Bladder  mod I  Transfers  mod I  Locomotion  mod I in home, S community ambulation and stairs  Communication     Cognition  Supervision   Pain  3 or less  Safety/Judgment  mod I   Therapy Plan: PT Intensity: Minimum of 1-2 x/day ,45 to 90 minutes PT Frequency: 5 out of 7 days PT Duration Estimated Length of Stay: 12-14 days OT Intensity: Minimum of 1-2 x/day, 45 to 90 minutes OT Frequency: 5 out of 7 days OT Duration/Estimated Length of Stay: 10-14 days SLP Intensity: Minumum of 1-2 x/day, 30 to 90 minutes SLP Frequency: 3 to 5 out of 7 days SLP Duration/Estimated Length of Stay: 12-14 days        Team  Interventions: Nursing Interventions Patient/Family Education, Disease Management/Prevention, Skin Care/Wound Management, Medication Management  PT interventions Ambulation/gait training, Training and development officer, Community reintegration, Cognitive remediation/compensation, Discharge planning, Neuromuscular re-education, Functional mobility training, Patient/family education, Psychosocial support, Stair training, Therapeutic Exercise, Therapeutic Activities, UE/LE Strength taining/ROM, UE/LE Coordination activities, Wheelchair propulsion/positioning, Visual/perceptual remediation/compensation, Disease management/prevention  OT Interventions Training and development officer, Cognitive remediation/compensation, Discharge planning, Community reintegration, Disease mangement/prevention, DME/adaptive equipment instruction, Functional mobility training, Neuromuscular re-education, Pain management, Patient/family education, Psychosocial support, Self Care/advanced ADL retraining, Therapeutic Activities, Therapeutic Exercise, UE/LE Strength taining/ROM, UE/LE Coordination activities, Visual/perceptual remediation/compensation  SLP Interventions Cognitive remediation/compensation, Cueing hierarchy, Functional tasks, Environmental controls, Patient/family education, Internal/external aids  TR Interventions    SW/CM Interventions Discharge Planning, Psychosocial Support, Patient/Family Education    Team Discharge Planning: Destination: PT-Home ,OT- Home , SLP-Home Projected Follow-up: PT-Home health PT, OT-  Outpatient OT, 24 hour supervision/assistance, SLP-Home Health SLP, 24 hour supervision/assistance, Outpatient SLP Projected Equipment Needs: PT-To be determined, OT- To be determined, SLP-None recommended by SLP Equipment Details: PT- , OT-  Patient/family involved in discharge planning: PT- Patient,  OT-Patient, SLP-Patient  MD ELOS: 10-12 days. Medical Rehab Prognosis:  Good Assessment: 75 y.o. right  handed male with history of HTN, T2DM, CAD s/p CABG, CKD, colon cancer who was admitted on 03/02/2016 with Chest pain. EKG with ST changes and elevated cardiac enzymes noted and he was started on IV heparin for NSTEMI.  2 D echo with EF 40-45% with severe hypokinesis of basal-midinferolateral, inferior and inferoseptal myocardium with grade 2 diastolic dysfunction. He underwent cardiac cath on 04/28 and post procedure developed confusion with agitation, slurred speech and visual deficits. 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 MRI brain showed acute small PCA infarcts affecting right occipital lobe, corpus callosum and pons. EEG without evidence of seizure activity. Dr. Leonie Man recommended continuing ASA/Plavix for non dominant embolic infarcts due to heart catheterization. Carotid dopplers without significant ICA stenosis. On 05/01 he developed CHB with new LBB and Dr. Rayann Heman was consulted for input. Metoprolol discontinued and heart block resolved for a short period, but then recurred. Dr. Tamala Julian recommends avoiding medications that affect AV node and monitor for now. Patient with deficits of gait and vision predominantly. Will set goals for mod I with PT and OT.  See Team Conference Notes for weekly updates to the plan of care

## 2016-03-11 NOTE — Progress Notes (Signed)
Subjective/Complaints: Patient lying in bed this morning. He states he slept fairly overnight.  ROS- denies CP, SOB, nausea, vomiting, diarrhea  Objective: Vital Signs: Blood pressure 123/62, pulse 110, temperature 98.2 F (36.8 C), temperature source Oral, resp. rate 18, weight 74.4 kg (164 lb 0.4 oz), SpO2 96 %. No results found. Results for orders placed or performed during the hospital encounter of 03/20/2016 (from the past 72 hour(s))  Glucose, capillary     Status: Abnormal   Collection Time: 03/25/2016  4:52 PM  Result Value Ref Range   Glucose-Capillary 230 (H) 65 - 99 mg/dL  Glucose, capillary     Status: Abnormal   Collection Time: 03/27/2016  8:33 PM  Result Value Ref Range   Glucose-Capillary 298 (H) 65 - 99 mg/dL  CBC WITH DIFFERENTIAL     Status: Abnormal   Collection Time: 03/10/16  5:21 AM  Result Value Ref Range   WBC 7.0 4.0 - 10.5 K/uL   RBC 3.48 (L) 4.22 - 5.81 MIL/uL   Hemoglobin 9.8 (L) 13.0 - 17.0 g/dL   HCT 29.5 (L) 39.0 - 52.0 %   MCV 84.8 78.0 - 100.0 fL   MCH 28.2 26.0 - 34.0 pg   MCHC 33.2 30.0 - 36.0 g/dL   RDW 15.8 (H) 11.5 - 15.5 %   Platelets 233 150 - 400 K/uL   Neutrophils Relative % 80 %   Neutro Abs 5.6 1.7 - 7.7 K/uL   Lymphocytes Relative 10 %   Lymphs Abs 0.7 0.7 - 4.0 K/uL   Monocytes Relative 8 %   Monocytes Absolute 0.6 0.1 - 1.0 K/uL   Eosinophils Relative 2 %   Eosinophils Absolute 0.1 0.0 - 0.7 K/uL   Basophils Relative 0 %   Basophils Absolute 0.0 0.0 - 0.1 K/uL  Comprehensive metabolic panel     Status: Abnormal   Collection Time: 03/10/16  5:21 AM  Result Value Ref Range   Sodium 138 135 - 145 mmol/L   Potassium 4.0 3.5 - 5.1 mmol/L   Chloride 99 (L) 101 - 111 mmol/L   CO2 25 22 - 32 mmol/L   Glucose, Bld 137 (H) 65 - 99 mg/dL   BUN 31 (H) 6 - 20 mg/dL   Creatinine, Ser 1.79 (H) 0.61 - 1.24 mg/dL   Calcium 8.4 (L) 8.9 - 10.3 mg/dL   Total Protein 5.4 (L) 6.5 - 8.1 g/dL   Albumin 2.1 (L) 3.5 - 5.0 g/dL   AST 28 15 - 41 U/L    ALT 21 17 - 63 U/L   Alkaline Phosphatase 62 38 - 126 U/L   Total Bilirubin 0.9 0.3 - 1.2 mg/dL   GFR calc non Af Amer 35 (L) >60 mL/min   GFR calc Af Amer 41 (L) >60 mL/min    Comment: (NOTE) The eGFR has been calculated using the CKD EPI equation. This calculation has not been validated in all clinical situations. eGFR's persistently <60 mL/min signify possible Chronic Kidney Disease.    Anion gap 14 5 - 15  Glucose, capillary     Status: Abnormal   Collection Time: 03/10/16  7:01 AM  Result Value Ref Range   Glucose-Capillary 135 (H) 65 - 99 mg/dL  Glucose, capillary     Status: Abnormal   Collection Time: 03/10/16 12:01 PM  Result Value Ref Range   Glucose-Capillary 299 (H) 65 - 99 mg/dL  Glucose, capillary     Status: Abnormal   Collection Time: 03/10/16  5:06 PM  Result  Value Ref Range   Glucose-Capillary 286 (H) 65 - 99 mg/dL   Comment 1 Notify RN   Glucose, capillary     Status: Abnormal   Collection Time: 03/10/16  8:53 PM  Result Value Ref Range   Glucose-Capillary 308 (H) 65 - 99 mg/dL  Glucose, capillary     Status: Abnormal   Collection Time: 03/11/16  6:39 AM  Result Value Ref Range   Glucose-Capillary 195 (H) 65 - 99 mg/dL     HEENT: Normocephalic, atraumatic Cardio: RRR and no murmur Resp: CTA B/L and unlabored GI: BS positive and non tender Musculoskeletal:  No edema. No tenderness.  Skin:   Intact. Warm and dry Neuro: Alert/Oriented x3, Dysarthria  Motor 4+/5 in BUE and BLE and Left field cut Gen NAD. Vital signs reviewed   Assessment/Plan: 1. 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 Physiatrist is providing close team supervision and 24 hour management of active medical problems listed below. Physiatrist and rehab team continue to assess barriers to  discharge/monitor patient progress toward functional and medical goals. FIM: Function - Bathing Bathing activity did not occur: Refused  Function- Upper Body Dressing/Undressing What is the patient wearing?: Pull over shirt/dress, Button up shirt Pull over shirt/dress - Perfomed by patient: Thread/unthread right sleeve, Thread/unthread left sleeve, Pull shirt over trunk Pull over shirt/dress - Perfomed by helper: Put head through opening Button up shirt - Perfomed by patient: Thread/unthread right sleeve, Thread/unthread left sleeve, Pull shirt around back Button up shirt - Perfomed by helper: Button/unbutton shirt Assist Level:  (Min assist) Function - Lower Body Dressing/Undressing What is the patient wearing?: Pants Position:  (recliner) Pants- Performed by patient: Thread/unthread right pants leg, Thread/unthread left pants leg, Pull pants up/down, Fasten/unfasten pants Assist for lower body dressing: Touching or steadying assistance (Pt > 75%)  Function - Toileting Toileting steps completed by patient: Adjust clothing prior to toileting, Performs perineal hygiene, Adjust clothing after toileting Toileting Assistive Devices: Grab bar or rail Assist level: Touching or steadying assistance (Pt.75%)  Function - Air cabin crew transfer assistive device: Grab bar, Walker Assist level to toilet: Touching or steadying assistance (Pt > 75%) Assist level from toilet: Touching or steadying assistance (Pt > 75%)  Function - Chair/bed transfer Chair/bed transfer method: Stand pivot Chair/bed transfer assist level: Touching or steadying assistance (Pt > 75%) Chair/bed transfer assistive device:  (HHA) Chair/bed transfer details: Verbal cues for sequencing, Verbal cues for technique, Tactile cues for weight shifting, Tactile cues for posture, Verbal cues for precautions/safety  Function - Locomotion: Wheelchair Will patient use wheelchair at discharge?: No Type: Manual Max  wheelchair distance: 150 Assist Level: Supervision or verbal cues Assist Level: Supervision or verbal cues Assist Level: Supervision or verbal cues Turns around,maneuvers to table,bed, and toilet,negotiates 3% grade,maneuvers on rugs and over doorsills: No Function - Locomotion: Ambulation Assistive device: Hand held assist, Rail in hallway Max distance: 150 Assist level: Touching or steadying assistance (Pt > 75%) Assist level: Touching or steadying assistance (Pt > 75%) Assist level: Touching or steadying assistance (Pt > 75%) Assist level: Touching or steadying assistance (Pt > 75%) Assist level: Moderate assist (Pt 50 - 74%)  Function - Comprehension Comprehension: Auditory Comprehension assist level: Follows basic conversation/direction with extra time/assistive device  Function - Expression Expression: Verbal Expression assist level: Expresses basic needs/ideas: With extra time/assistive device  Function -  Social Interaction Social Interaction assist level: Interacts appropriately 75 - 89% of the time - Needs redirection for appropriate language or to initiate interaction.  Function - Problem Solving Problem solving assist level: Solves basic 50 - 74% of the time/requires cueing 25 - 49% of the time  Function - Memory Memory assist level: Recognizes or recalls 50 - 74% of the time/requires cueing 25 - 49% of the time Patient normally able to recall (first 3 days only): Current season, Staff names and faces, That he or she is in a hospital  Medical Problem List and Plan: 1.  Gait disorder, left homonymous hemianopsia secondary to Right PCA distribution embolic infarcts  Continue CIR        2.  DVT Prophylaxis/Anticoagulation: Pharmaceutical: Lovenox 3. Pain Management:  Tylenol prn for HA effective.   4. Mood:  Wife has been supportive and present during the stay. LCSW to follow for evaluation and support.   5. Neuropsych: This patient is capable of making decisions on his   own behalf. 6. Skin/Wound Care: Routine pressure relief measures.   7. Fluids/Electrolytes/Nutrition: Monitor I/O . Hx of CKD  BMP Latest Ref Rng 03/10/2016 03/05/2016 03/04/2016  Glucose 65 - 99 mg/dL 137(H) 230(H) 153(H)  BUN 6 - 20 mg/dL 31(H) 25(H) 17  Creatinine 0.61 - 1.24 mg/dL 1.79(H) 2.03(H) 1.49(H)  Sodium 135 - 145 mmol/L 138 136 142  Potassium 3.5 - 5.1 mmol/L 4.0 4.0 4.0  Chloride 101 - 111 mmol/L 99(L) 100(L) 108  CO2 22 - 32 mmol/L _0 Calcium 8.9 - 10.3 mg/dL 8.4(L) 8.2(L) 8.4(L)    8. CAD s/pCABG/NSTEMI: To treat medically. On Imdur, Plavix, ASA and Lipitor. .   9. T2DM:  Hgb A1c-8.4. Will monitor BS ac/hs.   Continue to hold metformin due to renal status.    BS poorly controlled.   Continue Levemir 20 units at bedtime  Will add NovoLog 5 units with meals  (Used Tresiba 25 units for BS,200 and 35 units for BS> 200--will ask wife to bring from home.) CBG (last 3)   Recent Labs  03/10/16 1706 03/10/16 2053 03/11/16 0639  GLUCAP 286* 308* 195*  10. Acute on chronic renal failure:  BUN/Cr- 23/1.68 at admission--> 25/2.03 post cath.   11. ABLA:  Hemoglobin 9.8 on 5/5. 12. Second-degree heart block  Being followed by cardiology, appreciate recs.  Labile at present, Daily ECG  LOS (Days) 2 A FACE TO FACE EVALUATION WAS PERFORMED  Gabbrielle Mcnicholas Lorie Phenix 03/11/2016, 7:19 AM

## 2016-03-11 NOTE — Progress Notes (Signed)
Daily EKG performed, per order. Dr. Posey Pronto to review. No new orders at this time.

## 2016-03-11 NOTE — Progress Notes (Signed)
Speech Language Pathology Daily Session Note  Patient Details  Name: Carlos Wolf MRN: [id number] Date of Birth: 14-Jan-1941  Today's Date: 03/11/2016 SLP Individual Time: 1420-1503 SLP Individual Time Calculation (min): 43 min  Short Term Goals: Week 1: SLP Short Term Goal 1 (Week 1): Pt will use external memory aids to facilitate recall of daily information with min assist verbal cues.  SLP Short Term Goal 2 (Week 1): Pt will complete familiar semi-complex tasks with min assist verbal cues for functional problem solving.  SLP Short Term Goal 3 (Week 1): Pt will identify at least 2 deficits s/p CVA wtih min assist question cues.    Skilled Therapeutic Interventions: Skilled treatment session focused on addressing cognition goals. SLP facilitated session by providing discussion with orientation information due to patient asking therapist to take him to his room when we were in his room; patient required Min question cues to re-orient.  SLP also facilitated session with discussion regarding current dificts and rehab goals.  Patient required Mod question cues to identify that he was physically weak and had no awareness of cognitive changes.  Wife present and affirmed that there are no cognitive changes; however, niece was also present and voiced concerns to SLP.  SLP implemented an external aid to assist with recall of current medications  Following implementation of aid patient required Min verbal and visual cues to utilize correctly.  Continue with current plan of care.   Function:  Cognition Comprehension Comprehension assist level: Follows basic conversation/direction with extra time/assistive device  Expression   Expression assist level: Expresses basic needs/ideas: With extra time/assistive device  Social Interaction Social Interaction assist level: Interacts appropriately 75 - 89% of the time - Needs redirection for appropriate language or to initiate interaction.  Problem Solving Problem  solving assist level: Solves basic 50 - 74% of the time/requires cueing 25 - 49% of the time  Memory Memory assist level: Recognizes or recalls 50 - 74% of the time/requires cueing 25 - 49% of the time    Pain Pain Assessment Pain Assessment: No/denies pain  Therapy/Group: Individual Therapy  Carmelia Roller., [place] [id number]  [place] 03/11/2016, 3:15 PM

## 2016-03-11 NOTE — Plan of Care (Signed)
Problem: RH KNOWLEDGE DEFICIT Goal: RH STG INCREASE KNOWLEDGE OF DIABETES Supervision Outcome: Progressing Pt has a hx of diabetes. Patient and wife states that he was self managing at home

## 2016-03-12 ENCOUNTER — Inpatient Hospital Stay (HOSPITAL_COMMUNITY): Payer: Medicare Other | Admitting: Occupational Therapy

## 2016-03-12 ENCOUNTER — Inpatient Hospital Stay (HOSPITAL_COMMUNITY): Payer: Medicare Other | Admitting: Physical Therapy

## 2016-03-12 LAB — GLUCOSE, CAPILLARY
GLUCOSE-CAPILLARY: 224 mg/dL — AB (ref 65–99)
GLUCOSE-CAPILLARY: 211 mg/dL — AB (ref 65–99)
Glucose-Capillary: 314 mg/dL — ABNORMAL HIGH (ref 65–99)
Glucose-Capillary: 180 mg/dL — ABNORMAL HIGH (ref 65–99)

## 2016-03-12 NOTE — Significant Event (Signed)
At 1925, patient complained of "chest pain."  Described as "sharp pain", extending across entire chest at nipple line. Reports CP started 30 minutes before telling wife.  Denied feeling SOB. Vitals- 128/62, O2 sat=97% RA, HR 114-manually checked HR=110. At 1928, 1 PRN NTG tab given. 1931 reports pain had subsided. Vitals-114/58, O2 sat=100% RA, HR=112. Paged Dr. Posey Pronto, instructed to monitor for now. Patient anxious after episode. PRN trazodone 25mg  given at 2117. Carlos Wolf

## 2016-03-12 NOTE — Progress Notes (Signed)
Patient Profile: 75 yo man with PMH of CAD s/p CABG 2002 at [facility], T2DM, dyslipidemia, hypertension, colon cancer s/p surgery 01/2014 who presented with chest discomfort. He ruled in for NSTEMI.   Interval History:  LHC 03/03/16 showed 3/4 patent grafts (LIMA-LAD, SVG-Diag1, SVG-OM1). There was an atretic and probable recent occlusion of a small SVG which had supplied the PDA. Medical therapy recommended.  Post cath, patient developed AMS, confusion and slurred speach. Brain MRI 03/04/16 with small acute posterior circulation infarcts in the right occipital lobe, corpus callosum, and pons.   EKG 03/05/16 ~8:00 PM showed complete heart block with an escape rate of 69 and a wide-complex rhythm either an accelerated idioventricular rhythm or an IV conduction delay. Transient episodes but asymptomatic. Seen by EP 03/07/16. AV conduction abnormality felt to be secondary to recent inferior MI and likely to improve over time. Patient and his wife are clear that they would like to avoid PPM insertion if possible.    Subjective: Patient had brief (few minutes) of chest pain last PM; none this AM; no dyspnea.  Objective: Vital signs in last 24 hours: Temp:  [98 F (36.7 C)-98.8 F (37.1 C)] 98 F (36.7 C) (05/07 0647) Pulse Rate:  [99-114] 99 (05/07 0647) Resp:  [16-18] 18 (05/07 0647) BP: (114-128)/(56-69) 114/69 mmHg (05/07 0647) SpO2:  [93 %-99 %] 99 % (05/07 0647) Weight:  [167 lb 1.6 oz (75.796 kg)] 167 lb 1.6 oz (75.796 kg) (05/07 0647) Last BM Date: 03/12/16  Intake/Output from previous day: 05/06 0701 - 05/07 0700 In: 840 [P.O.:840] Out: 200 [Urine:200] Intake/Output this shift: Total I/O In: 340 [P.O.:340] Out: -   Medications Current Facility-Administered Medications  Medication Dose Route Frequency Provider Last Rate Last Dose  . acetaminophen (TYLENOL) tablet 650 mg  650 mg Oral Q4H PRN Bary Leriche, PA-C   650 mg at 03/10/16 1535  . aspirin EC tablet 81 mg  81 mg Oral QHS  Bary Leriche, PA-C   81 mg at 03/11/16 2117  . atorvastatin (LIPITOR) tablet 80 mg  80 mg Oral q1800 Ivan Anchors Love, PA-C   80 mg at 03/11/16 1753  . cholecalciferol (VITAMIN D) tablet 5,000 Units  5,000 Units Oral Daily Bary Leriche, PA-C   5,000 Units at 03/12/16 0941  . clopidogrel (PLAVIX) tablet 75 mg  75 mg Oral Daily Bary Leriche, PA-C   75 mg at 03/12/16 [id number]  . enoxaparin (LOVENOX) injection 40 mg  40 mg Subcutaneous Q24H Charlett Blake, MD   40 mg at 03/12/16 0942  . insulin aspart (novoLOG) injection 0-24 Units  0-24 Units Subcutaneous TID WC & HS Bary Leriche, PA-C   6 Units at 03/12/16 [id number]  . insulin aspart (novoLOG) injection 5 Units  5 Units Subcutaneous TID WC Ankit Lorie Phenix, MD   5 Units at 03/11/16 0800  . insulin detemir (LEVEMIR) injection 20 Units  20 Units Subcutaneous QHS Bary Leriche, PA-C   20 Units at 03/11/16 2118  . isosorbide mononitrate (IMDUR) 24 hr tablet 30 mg  30 mg Oral Daily Bary Leriche, PA-C   30 mg at 03/12/16 0943  . multivitamin with minerals tablet 1 tablet  1 tablet Oral Daily Bary Leriche, PA-C   1 tablet at 03/12/16 [phone]  . nitroGLYCERIN (NITROSTAT) SL tablet 0.4 mg  0.4 mg Sublingual Q5 Min x 3 PRN Bary Leriche, PA-C   0.4 mg at 03/11/16 1928  . pantoprazole (PROTONIX) EC tablet  40 mg  40 mg Oral Daily Bary Leriche, PA-C   40 mg at 03/12/16 0943  . traZODone (DESYREL) tablet 50 mg  50 mg Oral QHS PRN Charlett Blake, MD   25 mg at 03/11/16 2117    PE: WD/WN, NAD He is awake and alert.  HEENT-scar forehead Neck supple Chest CTA Cardiac exam RRR Abd: soft Extremities reveal no edema Neuro exam moves all ext  Lab Results:   Recent Labs  03/10/16 0521  WBC 7.0  HGB 9.8*  HCT 29.5*  PLT 233   BMET  Recent Labs  03/10/16 0521  NA 138  K 4.0  CL 99*  CO2 25  GLUCOSE 137*  BUN 31*  CREATININE 1.79*  CALCIUM 8.4*     Assessment/Plan  Active Problems:   Stroke due to embolism of right posterior cerebral  artery (HCC)   Gait disturbance, post-stroke   Cognitive deficit, post-stroke   Mobitz type 2 second degree heart block   Diabetes mellitus type 2 in nonobese (HCC)   Mobitz (type) I (Wenckebach's) atrioventricular block   Tachycardia   1. NSTEMI: s/p cath w/ med rx - on ASA, Plavix, high-dose statin, Imdur,  - No BB given HB/ bradycardia; Can consider low-dose in the near future as heart block has resolved on electrocardiogram. - continue current therapy - Had a brief chest pain last evening for several minutes. No further symptoms this morning. Would continue medical therapy for now. He had a recent catheterization and medical therapy recommended.  2. CVA:  - MRI c/w small acute posterior circulation infarcts in the right occipital lobe, corpus callosum and pons.  - Now in rehabilitation. Felt secondary to cardiac catheterization.  3. Transient CHB:  - Seen by EP 03/07/16. AV conduction abnormalities felt to be secondary to recent inferior MI. EKG today shows sinus with first-degree AV block. May be able to resume metoprolol in the near future. - Metoprolol discontinued.    LOS: 3 days    Kirk Ruths MD  03/12/2016 10:55 AM

## 2016-03-12 NOTE — Progress Notes (Signed)
Subjective/Complaints: Patient seen sleeping comfortably in bed this morning. His wife is at bedside. Pt had a very brief episode of CP yesterday, but it resolved.  He is still tachycardiac.    ROS- denies CP, SOB, nausea, vomiting, diarrhea  Objective: Vital Signs: Blood pressure 125/56, pulse 114, temperature 98.2 F (36.8 C), temperature source Oral, resp. rate 18, weight 74.4 kg (164 lb 0.4 oz), SpO2 93 %. No results found. Results for orders placed or performed during the hospital encounter of 03/28/2016 (from the past 72 hour(s))  Glucose, capillary     Status: Abnormal   Collection Time: 03/06/2016  4:52 PM  Result Value Ref Range   Glucose-Capillary 230 (H) 65 - 99 mg/dL  Glucose, capillary     Status: Abnormal   Collection Time: 03/21/2016  8:33 PM  Result Value Ref Range   Glucose-Capillary 298 (H) 65 - 99 mg/dL  CBC WITH DIFFERENTIAL     Status: Abnormal   Collection Time: 03/10/16  5:21 AM  Result Value Ref Range   WBC 7.0 4.0 - 10.5 K/uL   RBC 3.48 (L) 4.22 - 5.81 MIL/uL   Hemoglobin 9.8 (L) 13.0 - 17.0 g/dL   HCT 29.5 (L) 39.0 - 52.0 %   MCV 84.8 78.0 - 100.0 fL   MCH 28.2 26.0 - 34.0 pg   MCHC 33.2 30.0 - 36.0 g/dL   RDW 15.8 (H) 11.5 - 15.5 %   Platelets 233 150 - 400 K/uL   Neutrophils Relative % 80 %   Neutro Abs 5.6 1.7 - 7.7 K/uL   Lymphocytes Relative 10 %   Lymphs Abs 0.7 0.7 - 4.0 K/uL   Monocytes Relative 8 %   Monocytes Absolute 0.6 0.1 - 1.0 K/uL   Eosinophils Relative 2 %   Eosinophils Absolute 0.1 0.0 - 0.7 K/uL   Basophils Relative 0 %   Basophils Absolute 0.0 0.0 - 0.1 K/uL  Comprehensive metabolic panel     Status: Abnormal   Collection Time: 03/10/16  5:21 AM  Result Value Ref Range   Sodium 138 135 - 145 mmol/L   Potassium 4.0 3.5 - 5.1 mmol/L   Chloride 99 (L) 101 - 111 mmol/L   CO2 25 22 - 32 mmol/L   Glucose, Bld 137 (H) 65 - 99 mg/dL   BUN 31 (H) 6 - 20 mg/dL   Creatinine, Ser 1.79 (H) 0.61 - 1.24 mg/dL   Calcium 8.4 (L) 8.9 - 10.3  mg/dL   Total Protein 5.4 (L) 6.5 - 8.1 g/dL   Albumin 2.1 (L) 3.5 - 5.0 g/dL   AST 28 15 - 41 U/L   ALT 21 17 - 63 U/L   Alkaline Phosphatase 62 38 - 126 U/L   Total Bilirubin 0.9 0.3 - 1.2 mg/dL   GFR calc non Af Amer 35 (L) >60 mL/min   GFR calc Af Amer 41 (L) >60 mL/min    Comment: (NOTE) The eGFR has been calculated using the CKD EPI equation. This calculation has not been validated in all clinical situations. eGFR's persistently <60 mL/min signify possible Chronic Kidney Disease.    Anion gap 14 5 - 15  Glucose, capillary     Status: Abnormal   Collection Time: 03/10/16  7:01 AM  Result Value Ref Range   Glucose-Capillary 135 (H) 65 - 99 mg/dL  Glucose, capillary     Status: Abnormal   Collection Time: 03/10/16 12:01 PM  Result Value Ref Range   Glucose-Capillary 299 (H) 65 - 99  mg/dL  Glucose, capillary     Status: Abnormal   Collection Time: 03/10/16  5:06 PM  Result Value Ref Range   Glucose-Capillary 286 (H) 65 - 99 mg/dL   Comment 1 Notify RN   Glucose, capillary     Status: Abnormal   Collection Time: 03/10/16  8:53 PM  Result Value Ref Range   Glucose-Capillary 308 (H) 65 - 99 mg/dL  Glucose, capillary     Status: Abnormal   Collection Time: 03/11/16  6:39 AM  Result Value Ref Range   Glucose-Capillary 195 (H) 65 - 99 mg/dL  Glucose, capillary     Status: Abnormal   Collection Time: 03/11/16 12:10 PM  Result Value Ref Range   Glucose-Capillary 228 (H) 65 - 99 mg/dL  Glucose, capillary     Status: Abnormal   Collection Time: 03/11/16  5:05 PM  Result Value Ref Range   Glucose-Capillary 366 (H) 65 - 99 mg/dL  Glucose, capillary     Status: Abnormal   Collection Time: 03/11/16  9:13 PM  Result Value Ref Range   Glucose-Capillary 245 (H) 65 - 99 mg/dL    HEENT: Normocephalic, atraumatic Cardio: +Tachycardia. Regular rhythm and no murmur Resp: CTA B/L and unlabored GI: BS positive and non tender Musculoskeletal:  No edema. No tenderness.  Skin:   Intact.  Warm and dry Neuro: Alert/Oriented x3 Dysarthria  Motor 4+/5 in BUE and BLE Gen NAD. Vital signs reviewed  Assessment/Plan: 1. 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 Physiatrist is providing close team supervision and 24 hour management of active medical problems listed below. Physiatrist and rehab team continue to assess barriers to discharge/monitor patient progress toward functional and medical goals. FIM: Function - Bathing Bathing activity did not occur: Refused  Function- Upper Body Dressing/Undressing What is the patient wearing?: Pull over shirt/dress, Button up shirt Pull over shirt/dress - Perfomed by patient: Thread/unthread right sleeve, Thread/unthread left sleeve, Pull shirt over trunk Pull over shirt/dress - Perfomed by helper: Put head through opening Button up shirt - Perfomed by patient: Thread/unthread right sleeve, Thread/unthread left sleeve, Pull shirt around back Button up shirt - Perfomed by helper: Button/unbutton shirt Assist Level:  (Min assist) Function - Lower Body Dressing/Undressing What is the patient wearing?: Pants Position:  (recliner) Pants- Performed by patient: Thread/unthread right pants leg, Thread/unthread left pants leg, Pull pants up/down, Fasten/unfasten pants Assist for lower body dressing: Touching or steadying assistance (Pt > 75%)  Function - Toileting Toileting steps completed by patient: Adjust clothing prior to toileting, Performs perineal hygiene, Adjust clothing after toileting Toileting Assistive Devices: Grab bar or rail Assist level: Touching or steadying assistance (Pt.75%)  Function - Air cabin crew transfer assistive device: Grab bar, Walker Assist level to toilet: Touching or steadying assistance (Pt > 75%) Assist level from  toilet: Touching or steadying assistance (Pt > 75%)  Function - Chair/bed transfer Chair/bed transfer method: Stand pivot Chair/bed transfer assist level: Touching or steadying assistance (Pt > 75%) Chair/bed transfer assistive device:  (HHA) Chair/bed transfer details: Verbal cues for sequencing, Verbal cues for technique, Tactile cues for weight shifting, Tactile cues for posture, Verbal cues for precautions/safety  Function - Locomotion: Wheelchair Will patient use wheelchair at discharge?: No Type: Manual Max wheelchair distance: 150 Assist Level: Supervision or verbal cues Assist Level: Supervision or verbal cues Assist Level: Supervision or verbal  cues Turns around,maneuvers to table,bed, and toilet,negotiates 3% grade,maneuvers on rugs and over doorsills: No Function - Locomotion: Ambulation Assistive device: Hand held assist, Rail in hallway Max distance: 150 Assist level: Touching or steadying assistance (Pt > 75%) Assist level: Touching or steadying assistance (Pt > 75%) Assist level: Touching or steadying assistance (Pt > 75%) Assist level: Touching or steadying assistance (Pt > 75%) Assist level: Moderate assist (Pt 50 - 74%)  Function - Comprehension Comprehension: Auditory Comprehension assist level: Follows basic conversation/direction with extra time/assistive device  Function - Expression Expression: Verbal Expression assist level: Expresses basic needs/ideas: With extra time/assistive device  Function - Social Interaction Social Interaction assist level: Interacts appropriately 75 - 89% of the time - Needs redirection for appropriate language or to initiate interaction.  Function - Problem Solving Problem solving assist level: Solves basic 50 - 74% of the time/requires cueing 25 - 49% of the time  Function - Memory Memory assist level: Recognizes or recalls 50 - 74% of the time/requires cueing 25 - 49% of the time Patient normally able to recall (first 3  days only): Current season, Staff names and faces, That he or she is in a hospital  Medical Problem List and Plan: 1.  Gait disorder, left homonymous hemianopsia secondary to Right PCA distribution embolic infarcts  Continue CIR        2.  DVT Prophylaxis/Anticoagulation: Pharmaceutical: Lovenox 3. Pain Management:  Tylenol prn for HA effective.   4. Mood:  Wife has been supportive and present during the stay. LCSW to follow for evaluation and support.   5. Neuropsych: This patient is capable of making decisions on his  own behalf. 6. Skin/Wound Care: Routine pressure relief measures.   7. Fluids/Electrolytes/Nutrition: Monitor I/O . Hx of CKD  BMP Latest Ref Rng 03/10/2016 03/05/2016 03/04/2016  Glucose 65 - 99 mg/dL 137(H) 230(H) 153(H)  BUN 6 - 20 mg/dL 31(H) 25(H) 17  Creatinine 0.61 - 1.24 mg/dL 1.79(H) 2.03(H) 1.49(H)  Sodium 135 - 145 mmol/L 138 136 142  Potassium 3.5 - 5.1 mmol/L 4.0 4.0 4.0  Chloride 101 - 111 mmol/L 99(L) 100(L) 108  CO2 22 - 32 mmol/L '25 24 24  ' Calcium 8.9 - 10.3 mg/dL 8.4(L) 8.2(L) 8.4(L)    8. CAD s/pCABG/NSTEMI: To treat medically. On Imdur, Plavix, ASA and Lipitor. .   9. T2DM:  Hgb A1c-8.4. Will monitor BS ac/hs.   Continue to hold metformin due to renal status.    BS poorly controlled.   Continue Levemir 20 units at bedtime  NovoLog 5 units added with meals  (Used Tresiba 25 units for BS,200 and 35 units for BS> 200--will ask wife to bring from home.)  Patient did not receive any of his scheduled mealtime insulin yesterday, therefore will not make any further changes at present CBG (last 3)   Recent Labs  03/11/16 1210 03/11/16 1705 03/11/16 2113  GLUCAP 228* 366* 245*  10. Acute on chronic renal failure:  BUN/Cr- 23/1.68 at admission--> 25/2.03 post cath.   11. ABLA:  Hemoglobin 9.8 on 5/5. 12. Second-degree heart block  Being followed by cardiology, appreciate recs.  Daily ECG  Tachycardic over last 24 hours, will speak with cardiology  regarding further recs in light of tachycardia and chest pain overnight..  LOS (Days) 3 A FACE TO FACE EVALUATION WAS PERFORMED  Tykira Wachs Lorie Phenix 03/12/2016, 6:37 AM

## 2016-03-12 NOTE — Progress Notes (Signed)
Occupational Therapy Session Note  Patient Details  Name: Carlos Wolf MRN: [id number] Date of Birth: Nov 20, 1940  Today's Date: 03/12/2016 OT Individual Time:  -   1000-1100  (60 min)  1st session                                           1345-1430  (45 min)  2nd session       Short Term Goals: Week 1:  OT Short Term Goal 1 (Week 1): Pt will complete walk-in shower transfer with min assist OT Short Term Goal 2 (Week 1): Pt will complete bathing with min assit OT Short Term Goal 3 (Week 1): Pt will complete toilet transfers with min assist OT Short Term Goal 4 (Week 1): Pt will locate items on Lt during grooming tasks with min question cues OT Short Term Goal 5 (Week 1): Pt will complete 2 grooming tasks in standing with min assist for balance Week 2:     Skilled Therapeutic Interventions/Progress Updates:     1st session:  Pt sitting in recliner.  Agreed to perform bathing and dressing.  Pt used RW to ambulate to shower.  Transferred to shower with min assist.   Pt completed shower with sit to stand and standing balance with grab bar and min assist.  Ppt dressed  sit > stand level with min-mod assist for standing balance.  Pt needed rest breaks throughtout.  Needed assist with soes and socks due to decreased therapy time and pt fatigue.  Ambulated back to bed at end of session.    Lt homonymous hemianopsia noted during session as evidenced by pt unable to identify items in Lt upper and lower quadrants during functional mobility and when in shower.  . Transferred back to bed at end of session due to decreased endurance   2nd session:  Pt sitting in recliner.  Son present.  Pt ambulated with RW to rehab gym.  Engaged in UE therapeutic activities using 1 # weight for LUE.  Incorporated vision and rotation for attentin to the left side.  Pt needed cues for rotation and occular tracking.  Ambulated back to room.  Pt needed cues to find room as well as rook to Left.     Therapy  Documentation Precautions:  Precautions Precautions: Fall Precaution Comments: 5 falls in past year, has been weak since March 2016 with coloscopy; L visual field cut - WATCH HR Restrictions Weight Bearing Restrictions: No    Vital Signs: Therapy Vitals Temp: 98 F (36.7 C) Temp Source: Axillary Pulse Rate: 99 Resp: 18 BP: 114/69 mmHg Patient Position (if appropriate): Lying Oxygen Therapy SpO2: 99 % O2 Device: CPAP Pain:none      See Function Navigator for Current Functional Status.   Therapy/Group: Individual Therapy  Lisa Roca 03/12/2016, 7:56 AM

## 2016-03-12 NOTE — Progress Notes (Signed)
Physical Therapy Session Note  Patient Details  Name: Carlos Wolf MRN: [id number] Date of Birth: 1941/08/24  Today's Date: 03/12/2016 PT Individual Time: 0801-0900 and [id number] PT Individual Time Calculation (min): 59 min and 30 minutes  Short Term Goals: Week 1:  PT Short Term Goal 1 (Week 1): Pt will perform stand pivot transfer with S PT Short Term Goal 2 (Week 1): Pt will perform gait with min guard x150' PT Short Term Goal 3 (Week 1): Pt will perform initiate stair training PT Short Term Goal 4 (Week 1): Pt will perform dynamic standing balance of >5 min with min guard  Skilled Therapeutic Interventions/Progress Updates:    Treatment 1: Pt received in bed, wife present; pt agreeable to PT denying c/o pain. Pt required significantly extra time to transfer supine>sit with use of bed rails, bed flat & cuing for sequencing. Neuro re-ed completed with pt sitting at EOB to don shirt & pants. Presented with multiple posterior/R loss of balance with cuing to self-correct, but Min A overall to sit at EOB without continuing to lose balance. Total A to thread shirt on BUE & pants on BLE, as well as total A to don socks & shoes. Transferred sit<>stand & while standing pt required min A while pt completed donning pants. Pt with significant difficulty following one-step commands, poor short term recall & impulsive throughout session. HR = 110 at rest in bed, 119 bpm after standing & donning clothing; RN cleared pt for PT. Gait training x 130 ft with RW & min A with pt exhibiting absent heel strike BLE, decreased foot clearance LLE, short step length, shuffled steps; wife reports pt started shuffling feet several months ago. PT provided cuing for increased step length but pt with poor carryover; HR afterwards = 118 bpm. Stair negotiation completed with B rails x 4 steps (6 inches) with Min A, by ascending with RLE & descending leading with LLE. After rest break pt completed task again but with reciprocal  pattern even with cuing from therapist to take step-to pattern. Pt with L toe out & difficulty clearing step BLE when descending stairs. Pt returned to room & transferred w/c>recliner with difficulty advancing feet & sequencing task even with multimodal cuing. Pt left in recliner with QRB in place & all needs within reach; reviewed use of call bell with pt & pt able to return-demonstrate.   Treatment 2: Received pt in handoff from OT, pt's son present during session & pt agreeable. Pt completed stand pivot recliner>w/c with steady A with improved ability to sequence activity & steps. Pt transported to [facility] gym total A for time management. Utilized [employer] to address L inattention with pt utilizing LUE & sitting in w/c for 3 trials. Pt with reduced reaction time to Lower left quadrant but improved reaction time from 3.5 seconds to 3 seconds from first trial to final third trial. Pt performed activity in standing with RUE then with LUE, pt with quicker reaction time with LUE compared to R but continued to have reduced reaction time to lower L & R quadrants. Pt transported back to room & returned to recliner in same manner as noted above. At end of session pt left in recliner with BLE elevated, son present, QRB in place & all needs within reach.   Therapy Documentation Precautions:  Precautions Precautions: Fall Precaution Comments: 5 falls in past year, has been weak since March 2016 with coloscopy; L visual field cut - WATCH HR Restrictions Weight Bearing Restrictions: No Pain:  Pain Assessment Pain Assessment: No/denies pain   See Function Navigator for Current Functional Status.   Therapy/Group: Individual Therapy  Waunita Schooner 03/12/2016, 11:46 AM

## 2016-03-13 ENCOUNTER — Inpatient Hospital Stay (HOSPITAL_COMMUNITY): Payer: Medicare Other | Admitting: Occupational Therapy

## 2016-03-13 ENCOUNTER — Inpatient Hospital Stay (HOSPITAL_COMMUNITY): Payer: Medicare Other | Admitting: Speech Pathology

## 2016-03-13 ENCOUNTER — Inpatient Hospital Stay (HOSPITAL_COMMUNITY): Payer: Medicare Other | Admitting: Physical Therapy

## 2016-03-13 DIAGNOSIS — I214 Non-ST elevation (NSTEMI) myocardial infarction: Secondary | ICD-10-CM

## 2016-03-13 LAB — GLUCOSE, CAPILLARY
GLUCOSE-CAPILLARY: 288 mg/dL — AB (ref 65–99)
GLUCOSE-CAPILLARY: 234 mg/dL — AB (ref 65–99)
GLUCOSE-CAPILLARY: 193 mg/dL — AB (ref 65–99)
Glucose-Capillary: 174 mg/dL — ABNORMAL HIGH (ref 65–99)

## 2016-03-13 LAB — BASIC METABOLIC PANEL
Anion gap: 11 (ref 5–15)
BUN: 17 mg/dL (ref 6–20)
CHLORIDE: 99 mmol/L — AB (ref 101–111)
CO2: 23 mmol/L (ref 22–32)
CREATININE: 1.57 mg/dL — AB (ref 0.61–1.24)
Calcium: 8.4 mg/dL — ABNORMAL LOW (ref 8.9–10.3)
GFR calc non Af Amer: 41 mL/min — ABNORMAL LOW (ref >60)
GFR, EST AFRICAN AMERICAN: 48 mL/min — AB (ref >60)
Glucose, Bld: 202 mg/dL — ABNORMAL HIGH (ref 65–99)
POTASSIUM: 4.2 mmol/L (ref 3.5–5.1)
Sodium: 133 mmol/L — ABNORMAL LOW (ref 135–145)

## 2016-03-13 MED ORDER — DIPHENHYDRAMINE HCL 25 MG PO CAPS
25.0000 mg | ORAL_CAPSULE | Freq: Every day | ORAL | Status: DC
  Administered 2016-03-13 – 2016-03-15 (×3): 25 mg via ORAL
  Filled 2016-03-13 (×3): qty 1

## 2016-03-13 MED ORDER — METFORMIN HCL 850 MG PO TABS
850.0000 mg | ORAL_TABLET | Freq: Two times a day (BID) | ORAL | Status: DC
  Administered 2016-03-13 – 2016-03-14 (×4): 850 mg via ORAL
  Filled 2016-03-13 (×4): qty 1

## 2016-03-13 NOTE — Progress Notes (Signed)
Patient Profile: 75 yo man with PMH of CAD s/p CABG 2002 at [facility], T2DM, dyslipidemia, hypertension, colon cancer s/p surgery 01/2014 who presented with chest discomfort. He ruled in for NSTEMI.   Interval History:  LHC 03/03/16 showed 3/4 patent grafts (LIMA-LAD, SVG-Diag1, SVG-OM1). There was an atretic and probable recent occlusion of a small SVG which had supplied the PDA. Medical therapy recommended.  Post cath, patient developed AMS, confusion and slurred speach. Brain MRI 03/04/16 with small acute posterior circulation infarcts in the right occipital lobe, corpus callosum, and pons.   EKG 03/05/16 ~8:00 PM showed complete heart block with an escape rate of 69 and a wide-complex rhythm either an accelerated idioventricular rhythm or an IV conduction delay. Transient episodes but asymptomatic. Seen by EP 03/07/16. AV conduction abnormality felt to be secondary to recent inferior MI and likely to improve over time. Patient and his wife are clear that they would like to avoid PPM insertion if possible.    Subjective: Patient had brief (few minutes) of chest pain last PM; none this AM; no dyspnea.  Objective: Vital signs in last 24 hours: Temp:  [97.7 F (36.5 C)-97.9 F (36.6 C)] 97.9 F (36.6 C) (05/08 0603) Pulse Rate:  [101-106] 101 (05/08 0603) Resp:  [17-18] 18 (05/08 0603) BP: (118-132)/(61-64) 132/64 mmHg (05/08 0603) SpO2:  [99 %-100 %] 99 % (05/08 0603) Weight:  [165 lb 2 oz (74.9 kg)] 165 lb 2 oz (74.9 kg) (05/08 0603) Last BM Date: 03/12/16  Intake/Output from previous day: 05/07 0701 - 05/08 0700 In: 700 [P.O.:700] Out: 1075 [Urine:1075] Intake/Output this shift: Total I/O In: 360 [P.O.:360] Out: -   Medications Current Facility-Administered Medications  Medication Dose Route Frequency Provider Last Rate Last Dose  . acetaminophen (TYLENOL) tablet 650 mg  650 mg Oral Q4H PRN Bary Leriche, PA-C   650 mg at 03/10/16 1535  . aspirin EC tablet 81 mg  81 mg Oral  QHS Bary Leriche, PA-C   81 mg at 03/12/16 2123  . atorvastatin (LIPITOR) tablet 80 mg  80 mg Oral q1800 Ivan Anchors Love, PA-C   80 mg at 03/12/16 1725  . cholecalciferol (VITAMIN D) tablet 5,000 Units  5,000 Units Oral Daily Bary Leriche, PA-C   5,000 Units at 03/13/16 [id number]  . clopidogrel (PLAVIX) tablet 75 mg  75 mg Oral Daily Bary Leriche, PA-C   75 mg at 03/13/16 [id number]  . enoxaparin (LOVENOX) injection 40 mg  40 mg Subcutaneous Q24H Charlett Blake, MD   40 mg at 03/13/16 [id number]  . insulin aspart (novoLOG) injection 0-24 Units  0-24 Units Subcutaneous TID WC & HS Bary Leriche, PA-C   4 Units at 03/13/16 0827  . insulin aspart (novoLOG) injection 5 Units  5 Units Subcutaneous TID WC Ankit Lorie Phenix, MD   5 Units at 03/11/16 0800  . insulin detemir (LEVEMIR) injection 20 Units  20 Units Subcutaneous QHS Bary Leriche, PA-C   20 Units at 03/12/16 2124  . isosorbide mononitrate (IMDUR) 24 hr tablet 30 mg  30 mg Oral Daily Bary Leriche, PA-C   30 mg at 03/13/16 [id number]  . metFORMIN (GLUCOPHAGE) tablet 850 mg  850 mg Oral BID WC Ivan Anchors Love, PA-C      . multivitamin with minerals tablet 1 tablet  1 tablet Oral Daily Bary Leriche, PA-C   1 tablet at 03/13/16 [id number]  . nitroGLYCERIN (NITROSTAT) SL tablet 0.4 mg  0.4 mg Sublingual Q5  Min x 3 PRN Bary Leriche, PA-C   0.4 mg at 03/11/16 1928  . pantoprazole (PROTONIX) EC tablet 40 mg  40 mg Oral Daily Bary Leriche, PA-C   40 mg at 03/13/16 [id number]  . traZODone (DESYREL) tablet 50 mg  50 mg Oral QHS PRN Charlett Blake, MD   25 mg at 03/12/16 2123    PE: BP 132/64 mmHg  Pulse 101  Temp(Src) 97.9 F (36.6 C) (Oral)  Resp 18  Wt 165 lb 2 oz (74.9 kg)  SpO2 99%  WD/WN, NAD He is awake and alert.  HEENT-scar forehead  Neck supple Chest CTA Cardiac exam RRR Abd: soft Extremities reveal no edema Neuro exam moves all ext  Lab Results:  No results for input(s): WBC, HGB, HCT, PLT in the last 72 hours. BMET  Recent Labs  03/13/16 0520    NA 133*  K 4.2  CL 99*  CO2 23  GLUCOSE 202*  BUN 17  CREATININE 1.57*  CALCIUM 8.4*     Assessment/Plan  Active Problems:   Stroke due to embolism of right posterior cerebral artery (HCC)   Gait disturbance, post-stroke   Cognitive deficit, post-stroke   Mobitz type 2 second degree heart block   Diabetes mellitus type 2 in nonobese (HCC)   Mobitz (type) I (Wenckebach's) atrioventricular block   Tachycardia   1. NSTEMI: s/p cath w/ med rx - on ASA, Plavix, high-dose statin, Imdur,  - No BB given HB/ bradycardia;   He has tried low dose metoprolol in the past - even tried metoprol 12.5 mg BID - continue current therapy   2. CVA:  - MRI c/w small acute posterior circulation infarcts in the right occipital lobe, corpus callosum and pons.  - Now in rehabilitation. Felt secondary to cardiac catheterization.  3. Transient CHB:  - Seen by EP 03/07/16. AV conduction abnormalities felt to be secondary to recent inferior MI. EKG today shows sinus with first-degree AV block.   Will consider low dose Coreg if he remains tachycardic     LOS: 4 days     Ellard Nan, Wonda Cheng, MD  03/13/2016 9:40 AM    [facility] [place],  [place], [place]  [address] Pager [phone] Phone: [phone]; Fax: [phone]   [facility]  [address] [place], [place]  [address] [phone]   Fax [phone]

## 2016-03-13 NOTE — Progress Notes (Signed)
Patient already had cpap on when RT entered the room. Pt is resting comfortably.

## 2016-03-13 NOTE — Progress Notes (Signed)
Speech Language Pathology Daily Session Note  Patient Details  Name: Carlos Wolf MRN: [id number] Date of Birth: December 23, 1940  Today's Date: 03/13/2016 SLP Individual Time: 1015-1100 SLP Individual Time Calculation (min): 45 min  Short Term Goals: Week 1: SLP Short Term Goal 1 (Week 1): Pt will use external memory aids to facilitate recall of daily information with min assist verbal cues.  SLP Short Term Goal 2 (Week 1): Pt will complete familiar semi-complex tasks with min assist verbal cues for functional problem solving.  SLP Short Term Goal 3 (Week 1): Pt will identify at least 2 deficits s/p CVA wtih min assist question cues.    Skilled Therapeutic Interventions:  Pt was seen for skilled ST targeting cognitive goals.  SLP facilitated the session with money management tasks targeting functional problem solving for home management activities.  Pt was able to count money independently in 1 out of 6 opportunities, which improved to 2 out of 6 with min assist verbal cues, 4 out of 6 with mod assist verbal cues, and 6 out of 6 accuracy with max assist verbal cues for task organization and working memory.  Pt was able to make change in 2 out of 3 opportunities indpendently, which improved to 3 out of 3 accuracy with mod assist verbal cues for working memory while completing mental math calculations.  Pt was returned to room and left in recliner with quick release belt donned for safety.  Continue per current plan of care.     Function:  Eating Eating                 Cognition Comprehension Comprehension assist level: Follows basic conversation/direction with no assist  Expression   Expression assist level: Expresses basic needs/ideas: With no assist  Social Interaction Social Interaction assist level: Interacts appropriately 90% of the time - Needs monitoring or encouragement for participation or interaction.  Problem Solving Problem solving assist level: Solves basic 75 - 89% of the  time/requires cueing 10 - 24% of the time  Memory Memory assist level: Recognizes or recalls 50 - 74% of the time/requires cueing 25 - 49% of the time    Pain Pain Assessment Pain Assessment: No/denies pain  Therapy/Group: Individual Therapy  Keilany Burnette, Selinda Orion 03/13/2016, 12:45 PM

## 2016-03-13 NOTE — Progress Notes (Signed)
Subjective/Complaints: Poor sleep.  Denies any pains overnite, doesn't like CPAP, used for 4 hrs, no bowel or bladder issues overnite  ROS- denies CP, SOB, nausea, vomiting, diarrhea  Objective: Vital Signs: Blood pressure 132/64, pulse 101, temperature 97.9 F (36.6 C), temperature source Oral, resp. rate 18, weight 74.9 kg (165 lb 2 oz), SpO2 99 %. No results found. Results for orders placed or performed during the hospital encounter of 03/27/2016 (from the past 72 hour(s))  Glucose, capillary     Status: Abnormal   Collection Time: 03/10/16  7:01 AM  Result Value Ref Range   Glucose-Capillary 135 (H) 65 - 99 mg/dL  Glucose, capillary     Status: Abnormal   Collection Time: 03/10/16 12:01 PM  Result Value Ref Range   Glucose-Capillary 299 (H) 65 - 99 mg/dL  Glucose, capillary     Status: Abnormal   Collection Time: 03/10/16  5:06 PM  Result Value Ref Range   Glucose-Capillary 286 (H) 65 - 99 mg/dL   Comment 1 Notify RN   Glucose, capillary     Status: Abnormal   Collection Time: 03/10/16  8:53 PM  Result Value Ref Range   Glucose-Capillary 308 (H) 65 - 99 mg/dL  Glucose, capillary     Status: Abnormal   Collection Time: 03/11/16  6:39 AM  Result Value Ref Range   Glucose-Capillary 195 (H) 65 - 99 mg/dL  Glucose, capillary     Status: Abnormal   Collection Time: 03/11/16 12:10 PM  Result Value Ref Range   Glucose-Capillary 228 (H) 65 - 99 mg/dL  Glucose, capillary     Status: Abnormal   Collection Time: 03/11/16  5:05 PM  Result Value Ref Range   Glucose-Capillary 366 (H) 65 - 99 mg/dL  Glucose, capillary     Status: Abnormal   Collection Time: 03/11/16  9:13 PM  Result Value Ref Range   Glucose-Capillary 245 (H) 65 - 99 mg/dL  Glucose, capillary     Status: Abnormal   Collection Time: 03/12/16  6:45 AM  Result Value Ref Range   Glucose-Capillary 224 (H) 65 - 99 mg/dL  Glucose, capillary     Status: Abnormal   Collection Time: 03/12/16 11:54 AM  Result Value Ref Range    Glucose-Capillary 314 (H) 65 - 99 mg/dL   Comment 1 Notify RN   Glucose, capillary     Status: Abnormal   Collection Time: 03/12/16  4:59 PM  Result Value Ref Range   Glucose-Capillary 211 (H) 65 - 99 mg/dL  Glucose, capillary     Status: Abnormal   Collection Time: 03/12/16  9:05 PM  Result Value Ref Range   Glucose-Capillary 180 (H) 65 - 99 mg/dL  Basic metabolic panel     Status: Abnormal   Collection Time: 03/13/16  5:20 AM  Result Value Ref Range   Sodium 133 (L) 135 - 145 mmol/L   Potassium 4.2 3.5 - 5.1 mmol/L   Chloride 99 (L) 101 - 111 mmol/L   CO2 23 22 - 32 mmol/L   Glucose, Bld 202 (H) 65 - 99 mg/dL   BUN 17 6 - 20 mg/dL   Creatinine, Ser 1.57 (H) 0.61 - 1.24 mg/dL   Calcium 8.4 (L) 8.9 - 10.3 mg/dL   GFR calc non Af Amer 41 (L) >60 mL/min   GFR calc Af Amer 48 (L) >60 mL/min    Comment: (NOTE) The eGFR has been calculated using the CKD EPI equation. This calculation has not been validated in  all clinical situations. eGFR's persistently <60 mL/min signify possible Chronic Kidney Disease.    Anion gap 11 5 - 15    HEENT: Normocephalic, atraumatic Cardio: +Tachycardia. Regular rhythm and no murmur Resp: CTA B/L and unlabored GI: BS positive and non tender Musculoskeletal:  No edema. No tenderness.  Skin:   Intact. Warm and dry Neuro: Alert/Oriented x3 Dysarthria  Motor 4+/5 in BUE and BLE Gen NAD. Vital signs reviewed  Assessment/Plan: 1. 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 Physiatrist is providing close team supervision and 24 hour management of active medical problems listed below. Physiatrist and rehab team continue to assess barriers to discharge/monitor patient progress toward functional and medical goals. FIM: Function - Bathing Bathing activity did not occur:  Refused Position: Shower Body parts bathed by patient: Right arm, Left arm, Chest, Front perineal area, Abdomen, Buttocks, Right upper leg, Left upper leg Body parts bathed by helper: Right arm, Left arm, Chest, Abdomen, Front perineal area, Buttocks, Right upper leg, Left upper leg Assist Level: Touching or steadying assistance(Pt > 75%)  Function- Upper Body Dressing/Undressing What is the patient wearing?: Pull over shirt/dress, Button up shirt Pull over shirt/dress - Perfomed by patient: Thread/unthread right sleeve, Thread/unthread left sleeve, Pull shirt over trunk Pull over shirt/dress - Perfomed by helper: Put head through opening Button up shirt - Perfomed by patient: Thread/unthread right sleeve, Thread/unthread left sleeve, Pull shirt around back Button up shirt - Perfomed by helper: Button/unbutton shirt Assist Level:  (Min assist) Function - Lower Body Dressing/Undressing What is the patient wearing?: Pants Position: Wheelchair/chair at sink Pants- Performed by patient: Pull pants up/down Pants- Performed by helper: Thread/unthread right pants leg, Thread/unthread left pants leg Assist for footwear: Partial/moderate assist Assist for lower body dressing: Touching or steadying assistance (Pt > 75%)  Function - Toileting Toileting steps completed by patient: Adjust clothing prior to toileting, Performs perineal hygiene, Adjust clothing after toileting Toileting Assistive Devices: Grab bar or rail Assist level: Touching or steadying assistance (Pt.75%)  Function - Archivist transfer assistive device: Grab bar, Walker Assist level to toilet: Touching or steadying assistance (Pt > 75%) Assist level from toilet: Touching or steadying assistance (Pt > 75%)  Function - Chair/bed transfer Chair/bed transfer method: Stand pivot Chair/bed transfer assist level: Touching or steadying assistance (Pt > 75%) Chair/bed transfer assistive device: Bedrails,  Armrests Chair/bed transfer details: Manual facilitation for placement, Verbal cues for technique, Verbal cues for sequencing, Manual facilitation for weight shifting  Function - Locomotion: Wheelchair Will patient use wheelchair at discharge?: No Type: Manual Max wheelchair distance: 150 Assist Level: Supervision or verbal cues Assist Level: Supervision or verbal cues Assist Level: Supervision or verbal cues Turns around,maneuvers to table,bed, and toilet,negotiates 3% grade,maneuvers on rugs and over doorsills: No Function - Locomotion: Ambulation Assistive device: Walker-rolling Max distance: 130 Assist level: Touching or steadying assistance (Pt > 75%) Assist level: Touching or steadying assistance (Pt > 75%) Assist level: Touching or steadying assistance (Pt > 75%) Assist level: Touching or steadying assistance (Pt > 75%) Assist level: Moderate assist (Pt 50 - 74%)  Function - Comprehension Comprehension: Auditory Comprehension assist level: Follows basic conversation/direction with extra time/assistive device  Function - Expression Expression: Verbal Expression assist level: Expresses basic needs/ideas: With extra time/assistive device  Function - Social Interaction Social Interaction assist level: Interacts appropriately 75 - 89% of  the time - Needs redirection for appropriate language or to initiate interaction.  Function - Problem Solving Problem solving assist level: Solves basic 50 - 74% of the time/requires cueing 25 - 49% of the time  Function - Memory Memory assist level: Recognizes or recalls 50 - 74% of the time/requires cueing 25 - 49% of the time Patient normally able to recall (first 3 days only): Current season, Staff names and faces, That he or she is in a hospital  Medical Problem List and Plan: 1.  Gait disorder, left homonymous hemianopsia secondary to Right PCA distribution embolic infarcts  Continue CIR        2.  DVT Prophylaxis/Anticoagulation:  Pharmaceutical: Lovenox 3. Pain Management:  Tylenol prn for HA effective.   4. Mood:  Wife has been supportive and present during the stay. LCSW to follow for evaluation and support.  Poor sleep, no specific reason, used benadryl at home will trial 5. Neuropsych: This patient is capable of making decisions on his  own behalf. 6. Skin/Wound Care: Routine pressure relief measures.   7. Fluids/Electrolytes/Nutrition: Monitor I/O . Hx of CKD  BMP Latest Ref Rng 03/13/2016 03/10/2016 03/05/2016  Glucose 65 - 99 mg/dL 202(H) 137(H) 230(H)  BUN 6 - 20 mg/dL 17 31(H) 25(H)  Creatinine 0.61 - 1.24 mg/dL 1.57(H) 1.79(H) 2.03(H)  Sodium 135 - 145 mmol/L 133(L) 138 136  Potassium 3.5 - 5.1 mmol/L 4.2 4.0 4.0  Chloride 101 - 111 mmol/L 99(L) 99(L) 100(L)  CO2 22 - 32 mmol/L _0 Calcium 8.9 - 10.3 mg/dL 8.4(L) 8.4(L) 8.2(L)    8. CAD s/pCABG/NSTEMI: To treat medically. On Imdur, Plavix, ASA and Lipitor. .   9. T2DM:  Hgb A1c-8.4. Will monitor BS ac/hs.   Continue to hold metformin due to renal status.    BS poorly controlled.   Will d/c  Levemir 20 units at bedtime now that wife has brought in Marlow 5 units added with meals  (Used Tresiba 25 units for BS,200 and 35 units for BS> 200--will ask wife to bring from home.)  Patient did not receive any of his scheduled mealtime insulin yesterday, therefore will not make any further changes at present CBG (last 3)   Recent Labs  03/12/16 1154 03/12/16 1659 03/12/16 2105  GLUCAP 314* 211* 180*  10. Acute on chronic renal failure:  BUN/Cr- 23/1.68 at admission--> 25/2.03 post cath.   11. ABLA:  Hemoglobin 9.8 on 5/5. 12. Second-degree heart block  Being followed by cardiology, appreciate recs.  Daily ECG  Appreciate cardiology note  LOS (Days) 4 A FACE TO FACE EVALUATION WAS PERFORMED  KIRSTEINS,ANDREW E 03/13/2016, 6:35 AM

## 2016-03-13 NOTE — Progress Notes (Signed)
Occupational Therapy Session Note  Patient Details  Name: Carlos Wolf MRN: [id number] Date of Birth: [birth year]  Today's Date: 03/13/2016 OT Individual Time: 1300-1430 OT Individual Time Calculation (min): 90 min   Short Term Goals: Week 1:  OT Short Term Goal 1 (Week 1): Pt will complete walk-in shower transfer with min assist OT Short Term Goal 2 (Week 1): Pt will complete bathing with min assit OT Short Term Goal 3 (Week 1): Pt will complete toilet transfers with min assist OT Short Term Goal 4 (Week 1): Pt will locate items on Lt during grooming tasks with min question cues OT Short Term Goal 5 (Week 1): Pt will complete 2 grooming tasks in standing with min assist for balance  Skilled Therapeutic Interventions/Progress Updates:  Patient found seated in recliner with quick release belt donned. Pt willing and eager to work with therapist. Pt stood with min guard assist and RW. Pt ambulated from w/c to BR for toilet transfer. Pt with stool incontinence. Pt sat on elevated toilet seat with successful urine and bowel output. Pt then transferred toilet to tub transfer bench in walk-in shower. Pt completed UB/LB bathing in sit to/from stand position. Pt dried self, then ambulated to w/c set-up near sink for UB/LB dressing in sit to/from stand position. Pt sat at sink for grooming tasks of shaving and brushing of teeth. Pt with difficult finding toothbrush secondary to left inattention and required max cueing to find toothbrush to the left of the sink. During ADL and functional mobility, pt required mod verbal cues for initiation, problem solving, and overall awareness. Pt took increased time to complete tasks during this ADL session and required multiple rest breaks due to generalized fatigue and muscle weakness. Also, during session, therapist noticed small skin tear on left side of chest, notified RN and put TegaDerm in place. Pt transferred back to recliner towards the end of the session and  therapist left pt seated in recliner with quick release belt donned and all needs within reach.   Therapy Documentation Precautions:  Precautions Precautions: Fall Precaution Comments: 5 falls in past year, has been weak since March 2016 with coloscopy; L visual field cut - WATCH HR Restrictions Weight Bearing Restrictions: No  Vital Signs: Therapy Vitals Temp: 97.9 F (36.6 C) Temp Source: Oral Pulse Rate: (!) 101 Resp: 18 BP: 132/64 mmHg Patient Position (if appropriate): Lying Oxygen Therapy SpO2: 99 % O2 Device: Not Delivered  See Function Navigator for Current Functional Status.  Therapy/Group: Individual Therapy  Chrys Racer , MS, OTR/L, CLT  03/13/2016, 3:52 PM

## 2016-03-13 NOTE — Progress Notes (Signed)
Physical Therapy Session Note  Patient Details  Name: Carlos Wolf MRN: [id number] Date of Birth: Jul 29, 1941  Today's Date: 03/13/2016 PT Individual Time: 0800-0900 PT Individual Time Calculation (min): 60 min   Short Term Goals: Week 1:  PT Short Term Goal 1 (Week 1): Pt will perform stand pivot transfer with S PT Short Term Goal 2 (Week 1): Pt will perform gait with min guard x150' PT Short Term Goal 3 (Week 1): Pt will perform initiate stair training PT Short Term Goal 4 (Week 1): Pt will perform dynamic standing balance of >5 min with min guard  Skilled Therapeutic Interventions/Progress Updates:    Pt received supine in bed completing breakfast; denies pain and agreeable to treatment. Supine>sit with HOB flat and bedrails with S and increased time. Lower body dressing performed EOB with maxA; able to thread RLE of pants however required assist for threading RLE, B socks and shoes. Sit <>stand with minA and bedrails with multiple attempts due to posterior LOB. Gait to gym x150' with RW and min guard. Attempted to initiate standing balance with stepping to numbered targets, however noted pt has difficulty maintaining upright posture due to repetitive LOB posteriorly. Repeated sit <>stand from elevated mat table with BLEs on red wedge with cueing at hips, trunk, max multimodal cues for anterior weight shift with pt unable to attain midline without maxA. Standing with BUE on RW performing BLE heel/toe raises for facilitation of weight translation from heels to toes; then attempted again sit <>stand and standing balance on wedge, with no evidence of improved weight shift or dorsiflexor activation. Mat table >w/c>recliner stand pivot min guard. Remained seated in recliner with quick release belt intact at completion of session, all needs within reach.   Therapy Documentation Precautions:  Precautions Precautions: Fall Precaution Comments: 5 falls in past year, has been weak since March 2016 with  coloscopy; L visual field cut - WATCH HR Restrictions Weight Bearing Restrictions: No Pain: Pain Assessment Pain Assessment: No/denies pain   See Function Navigator for Current Functional Status.   Therapy/Group: Individual Therapy  Luberta Mutter 03/13/2016, 11:18 AM

## 2016-03-13 NOTE — Progress Notes (Signed)
PRN trazodone 25mg  given at 2123, per wife's request. Patient slept poorly during the night without specific complaint of. Removed CPAP. Wife wanting to start home insulin-tresiba. Carlos Wolf A

## 2016-03-14 ENCOUNTER — Inpatient Hospital Stay (HOSPITAL_COMMUNITY): Payer: Medicare Other | Admitting: Speech Pathology

## 2016-03-14 ENCOUNTER — Inpatient Hospital Stay (HOSPITAL_COMMUNITY): Payer: Medicare Other | Admitting: Occupational Therapy

## 2016-03-14 ENCOUNTER — Inpatient Hospital Stay (HOSPITAL_COMMUNITY): Payer: Medicare Other | Admitting: Physical Therapy

## 2016-03-14 DIAGNOSIS — I25111 Atherosclerotic heart disease of native coronary artery with angina pectoris with documented spasm: Secondary | ICD-10-CM

## 2016-03-14 LAB — GLUCOSE, CAPILLARY
GLUCOSE-CAPILLARY: 121 mg/dL — AB (ref 65–99)
GLUCOSE-CAPILLARY: 169 mg/dL — AB (ref 65–99)
Glucose-Capillary: 109 mg/dL — ABNORMAL HIGH (ref 65–99)
Glucose-Capillary: 190 mg/dL — ABNORMAL HIGH (ref 65–99)

## 2016-03-14 MED ORDER — CARVEDILOL 3.125 MG PO TABS
3.1250 mg | ORAL_TABLET | Freq: Every day | ORAL | Status: DC
  Administered 2016-03-14 – 2016-03-15 (×2): 3.125 mg via ORAL
  Filled 2016-03-14 (×2): qty 1

## 2016-03-14 NOTE — Progress Notes (Signed)
Occupational Therapy Session Note  Patient Details  Name: Carlos Wolf MRN: [id number] Date of Birth: 05/16/1941  Today's Date: 03/14/2016 OT Individual Time: 1050-1135 OT Individual Time Calculation (min): 45 min   Short Term Goals: Week 1:  OT Short Term Goal 1 (Week 1): Pt will complete walk-in shower transfer with min assist OT Short Term Goal 2 (Week 1): Pt will complete bathing with min assit OT Short Term Goal 3 (Week 1): Pt will complete toilet transfers with min assist OT Short Term Goal 4 (Week 1): Pt will locate items on Lt during grooming tasks with min question cues OT Short Term Goal 5 (Week 1): Pt will complete 2 grooming tasks in standing with min assist for balance  Skilled Therapeutic Interventions/Progress Updates:  Upon entering room, pt found seated in recliner with wife present in room. Pt with no complaints of pain. Pt transferred recliner to w/c with min guard assist. From here, pt propelled self from room to ADL apartment. Therapist introduced and educated wife on tub transfer bench. Pt then ambulated using RW into BR and practiced simulated tub/shower transfer using tub transfer bench; min guard for transfer. Encouraged wife to look into purchasing a tub transfer bench for patient to use at home, wife stated she planned on purchasing one - will notify patient's primary OT.  Pt then ambulated back to w/c and therapist assisted pt ADL apartment to therapy gym. Once in therapy gym engaged patient in therapeutic activity focusing on memory and functional use of L hand. Therapist assisted pt back to room and pt transferred onto recliner. Left pt seated in recliner with all needs within reach and quick release belt donned.   Therapy Documentation Precautions:  Precautions Precautions: Fall Precaution Comments: 5 falls in past year, has been weak since March 2016 with coloscopy; L visual field cut - WATCH HR Restrictions Weight Bearing Restrictions: No  Vital  Signs: Therapy Vitals Temp: 98.2 F (36.8 C) Temp Source: Oral Pulse Rate: (!) 102 Resp: 17 BP: (!) 100/56 mmHg Patient Position (if appropriate): Lying Oxygen Therapy SpO2: 99 % O2 Device: Not Delivered  See Function Navigator for Current Functional Status.  Therapy/Group: Individual Therapy  Chrys Racer , MS, OTR/L, CLT  Pager: [phone]  03/14/2016, 12:43 PM

## 2016-03-14 NOTE — Progress Notes (Signed)
Physical Therapy Session Note  Patient Details  Name: Carlos Wolf MRN: [id number] Date of Birth: January 19, 1941  Today's Date: 03/14/2016 PT Individual Time: 0900-1000 and 1300-1345 PT Individual Time Calculation (min): 60 min and 45 min (total 105 min)   Short Term Goals: Week 1:  PT Short Term Goal 1 (Week 1): Pt will perform stand pivot transfer with S PT Short Term Goal 2 (Week 1): Pt will perform gait with min guard x150' PT Short Term Goal 3 (Week 1): Pt will perform initiate stair training PT Short Term Goal 4 (Week 1): Pt will perform dynamic standing balance of >5 min with min guard  Skilled Therapeutic Interventions/Progress Updates:    Tx 1: Pt received supine in bed with wife present; denies pain and agreeable to treatment. Supine>sit with S and bedrails. Dons B socks and shoes at EOB with minA after several unsuccessful attempts and increased time. Gait to gym with RW and min guard overall x150'. Standing balance on level surface with BUE reaching for card matching task; mildly improved balance righting reactions with ankle strategy. Standing on balance foam with RW initially, and pt able to remove BUEs 5-10 sec before losing balance posteriorly and to R. Improved with repetition and progressed to static standing with head turns with increased LOB. Standing on rocker board anterior/posterior to facilitate ankle strategy for movement; required +2A for safety and pt comfort. Gait to return to room x150' with min guard. Remained seated in recliner with wife present and all needs within reach.  Tx 2: Pt received seated in recliner, denies pain and agreeable to treatment. Sit <>stand with S and several attempts with pt adjusting hand positioning to assist. Gait to gym with RW and S x150'. Stairs 2x12 on 6-inch stairs with B handrails and close S; variable step-to and reciprocal pattern. Standing balance on wedge (small and medium) with dynamic UE reaching to retrieve horseshoes and to hit beach  ball. Significantly improved righting reactions and ankle strategy to prevent LOB posteriorly. Gait to return to room with HHA and min/modA; cues to slow performance due to unsafe increase in gait speed and shuffling, and cues for upright posture and forward gaze. Remained seated in recliner at completion of session, all needs within reach.   Therapy Documentation Precautions:  Precautions Precautions: Fall Precaution Comments: 5 falls in past year, has been weak since March 2016 with coloscopy; L visual field cut - WATCH HR Restrictions Weight Bearing Restrictions: No Pain: Pain Assessment Pain Assessment: No/denies pain   See Function Navigator for Current Functional Status.   Therapy/Group: Individual Therapy  Luberta Mutter 03/14/2016, 2:11 PM

## 2016-03-14 NOTE — Progress Notes (Signed)
Nurse called to room by Carlos Wolf, ST. Patient was bleeding from a small cut to the distal end of his foreskin. Pt reported that wife had attempted to remove some pubic hair from the area and accidentally 'nicked' him. Pressure applied to area for several minutes, bleeding eventually resolved. Nurse also noted two skin tears to R upper and L lower scrotum. Barrier cream applied to area for protection.

## 2016-03-14 NOTE — Progress Notes (Signed)
Subjective/Complaints: No issues overnite, slept better, pt couldn't tell me how long he used CPAP  ROS- denies CP, SOB, nausea, vomiting, diarrhea  Objective: Vital Signs: Blood pressure 100/56, pulse 102, temperature 98.2 F (36.8 C), temperature source Oral, resp. rate 17, weight 75.3 kg (166 lb 0.1 oz), SpO2 99 %. No results found. Results for orders placed or performed during the hospital encounter of 03/27/2016 (from the past 72 hour(s))  Glucose, capillary     Status: Abnormal   Collection Time: 03/11/16  6:39 AM  Result Value Ref Range   Glucose-Capillary 195 (H) 65 - 99 mg/dL  Glucose, capillary     Status: Abnormal   Collection Time: 03/11/16 12:10 PM  Result Value Ref Range   Glucose-Capillary 228 (H) 65 - 99 mg/dL  Glucose, capillary     Status: Abnormal   Collection Time: 03/11/16  5:05 PM  Result Value Ref Range   Glucose-Capillary 366 (H) 65 - 99 mg/dL  Glucose, capillary     Status: Abnormal   Collection Time: 03/11/16  9:13 PM  Result Value Ref Range   Glucose-Capillary 245 (H) 65 - 99 mg/dL  Glucose, capillary     Status: Abnormal   Collection Time: 03/12/16  6:45 AM  Result Value Ref Range   Glucose-Capillary 224 (H) 65 - 99 mg/dL  Glucose, capillary     Status: Abnormal   Collection Time: 03/12/16 11:54 AM  Result Value Ref Range   Glucose-Capillary 314 (H) 65 - 99 mg/dL   Comment 1 Notify RN   Glucose, capillary     Status: Abnormal   Collection Time: 03/12/16  4:59 PM  Result Value Ref Range   Glucose-Capillary 211 (H) 65 - 99 mg/dL  Glucose, capillary     Status: Abnormal   Collection Time: 03/12/16  9:05 PM  Result Value Ref Range   Glucose-Capillary 180 (H) 65 - 99 mg/dL  Basic metabolic panel     Status: Abnormal   Collection Time: 03/13/16  5:20 AM  Result Value Ref Range   Sodium 133 (L) 135 - 145 mmol/L   Potassium 4.2 3.5 - 5.1 mmol/L   Chloride 99 (L) 101 - 111 mmol/L   CO2 23 22 - 32 mmol/L   Glucose, Bld 202 (H) 65 - 99 mg/dL   BUN 17  6 - 20 mg/dL   Creatinine, Ser 1.57 (H) 0.61 - 1.24 mg/dL   Calcium 8.4 (L) 8.9 - 10.3 mg/dL   GFR calc non Af Amer 41 (L) >60 mL/min   GFR calc Af Amer 48 (L) >60 mL/min    Comment: (NOTE) The eGFR has been calculated using the CKD EPI equation. This calculation has not been validated in all clinical situations. eGFR's persistently <60 mL/min signify possible Chronic Kidney Disease.    Anion gap 11 5 - 15  Glucose, capillary     Status: Abnormal   Collection Time: 03/13/16  6:46 AM  Result Value Ref Range   Glucose-Capillary 174 (H) 65 - 99 mg/dL  Glucose, capillary     Status: Abnormal   Collection Time: 03/13/16 11:31 AM  Result Value Ref Range   Glucose-Capillary 288 (H) 65 - 99 mg/dL   Comment 1 Notify RN   Glucose, capillary     Status: Abnormal   Collection Time: 03/13/16  4:42 PM  Result Value Ref Range   Glucose-Capillary 234 (H) 65 - 99 mg/dL   Comment 1 Notify RN   Glucose, capillary     Status: Abnormal  Collection Time: 03/13/16  8:44 PM  Result Value Ref Range   Glucose-Capillary 193 (H) 65 - 99 mg/dL    HEENT: Normocephalic, atraumatic Cardio: +Tachycardia. Regular rhythm and no murmur Resp: CTA B/L and unlabored GI: BS positive and non tender Musculoskeletal:  No edema. No tenderness.  Skin:   Intact. Warm and dry Neuro: Alert/Oriented x3 Dysarthria  Motor 4+/5 in BUE and BLE Gen NAD. Vital signs reviewed  Assessment/Plan: 1. 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 Physiatrist is providing close team supervision and 24 hour management of active medical problems listed below. Physiatrist and rehab team continue to assess barriers to discharge/monitor patient progress toward functional and medical goals. FIM: Function - Bathing Bathing activity did not occur:  Refused Position: Shower Body parts bathed by patient: Right arm, Left arm, Chest, Front perineal area, Abdomen, Buttocks, Right upper leg, Left upper leg Body parts bathed by helper: Right lower leg, Left lower leg, Back Assist Level: Touching or steadying assistance(Pt > 75%) (min assist for safety with sit to/from stands)  Function- Upper Body Dressing/Undressing What is the patient wearing?: Button up shirt Pull over shirt/dress - Perfomed by patient: Thread/unthread right sleeve, Thread/unthread left sleeve, Pull shirt over trunk Pull over shirt/dress - Perfomed by helper: Put head through opening Button up shirt - Perfomed by patient: Thread/unthread right sleeve, Thread/unthread left sleeve, Pull shirt around back, Button/unbutton shirt Button up shirt - Perfomed by helper: Button/unbutton shirt Assist Level: Set up, Supervision or verbal cues (cues for safety, cues to sit and not stand when donning shirt for patient's safety) Set up : To obtain clothing/put away Function - Lower Body Dressing/Undressing What is the patient wearing?: Pants, Socks, Shoes Position: Wheelchair/chair at sink Pants- Performed by patient: Pull pants up/down, Thread/unthread right pants leg, Thread/unthread left pants leg, Fasten/unfasten pants Pants- Performed by helper: Thread/unthread right pants leg, Thread/unthread left pants leg Socks - Performed by helper: Don/doff right sock, Don/doff left sock Shoes - Performed by helper: Don/doff right shoe, Don/doff left shoe, Fasten right, Fasten left Assist for footwear: Dependant Assist for lower body dressing: Touching or steadying assistance (Pt > 75%) (min guard/assist during sit to/from stands and for standing balance when pulling up his pants)  Function - Toileting Toileting steps completed by patient: Adjust clothing prior to toileting, Performs perineal hygiene, Adjust clothing after toileting Toileting Assistive Devices: Grab bar or rail Assist level:  Touching or steadying assistance (Pt.75%)  Function - Air cabin crew transfer assistive device: Grab bar, Walker Assist level to toilet: Touching or steadying assistance (Pt > 75%) Assist level from toilet: Touching or steadying assistance (Pt > 75%)  Function - Chair/bed transfer Chair/bed transfer method: Stand pivot Chair/bed transfer assist level: Touching or steadying assistance (Pt > 75%) Chair/bed transfer assistive device: Bedrails, Armrests Chair/bed transfer details: Manual facilitation for placement, Verbal cues for technique, Verbal cues for sequencing, Manual facilitation for weight shifting  Function - Locomotion: Wheelchair Will patient use wheelchair at discharge?: No Type: Manual Max wheelchair distance: 150 Assist Level: Supervision or verbal cues Assist Level: Supervision or verbal cues Assist Level: Supervision or verbal cues Turns around,maneuvers to table,bed, and toilet,negotiates 3% grade,maneuvers on rugs and over doorsills: No Function - Locomotion: Ambulation Assistive device: Walker-rolling Max distance: 150 Assist level: Touching or steadying assistance (Pt > 75%) Assist level: Touching or steadying assistance (Pt >  75%) Assist level: Touching or steadying assistance (Pt > 75%) Assist level: Touching or steadying assistance (Pt > 75%) Assist level: Moderate assist (Pt 50 - 74%)  Function - Comprehension Comprehension: Auditory Comprehension assist level: Follows basic conversation/direction with no assist  Function - Expression Expression: Verbal Expression assist level: Expresses basic needs/ideas: With no assist  Function - Social Interaction Social Interaction assist level: Interacts appropriately 90% of the time - Needs monitoring or encouragement for participation or interaction.  Function - Problem Solving Problem solving assist level: Solves basic 75 - 89% of the time/requires cueing 10 - 24% of the time  Function -  Memory Memory assist level: Recognizes or recalls 50 - 74% of the time/requires cueing 25 - 49% of the time Patient normally able to recall (first 3 days only): Current season, Staff names and faces, That he or she is in a hospital  Medical Problem List and Plan: 1.  Gait disorder, left homonymous hemianopsia secondary to Right PCA distribution embolic infarcts  Continue CIR, Team conf in am        2.  DVT Prophylaxis/Anticoagulation: Pharmaceutical: Lovenox 3. Pain Management:  Tylenol prn for HA effective.   4. Mood:  Wife has been supportive and present during the stay. LCSW to follow for evaluation and support.  Insomnia improved with benadryl , monitor for urinary retention and confusion 5. Neuropsych: This patient is capable of making decisions on his  own behalf. 6. Skin/Wound Care: Routine pressure relief measures.   7. Fluids/Electrolytes/Nutrition: Monitor I/O . Hx of CKD  BMP Latest Ref Rng 03/13/2016 03/10/2016 03/05/2016  Glucose 65 - 99 mg/dL 202(H) 137(H) 230(H)  BUN 6 - 20 mg/dL 17 31(H) 25(H)  Creatinine 0.61 - 1.24 mg/dL 1.57(H) 1.79(H) 2.03(H)  Sodium 135 - 145 mmol/L 133(L) 138 136  Potassium 3.5 - 5.1 mmol/L 4.2 4.0 4.0  Chloride 101 - 111 mmol/L 99(L) 99(L) 100(L)  CO2 22 - 32 mmol/L _0 Calcium 8.9 - 10.3 mg/dL 8.4(L) 8.4(L) 8.2(L)    8. CAD s/pCABG/NSTEMI: To treat medically. On Imdur, Plavix, ASA and Lipitor. .   9. T2DM:  Hgb A1c-8.4. Will monitor BS ac/hs.   Continue to hold metformin due to renal status.    BS poorly controlled.   Pt still on [place], wife brought in [place], will increase levimir to 25Units  NovoLog 5 units added with meals  (Used Tresiba 25 units for BS,200 and 35 units for BS> 200--will ask wife to bring from home.)  CBG (last 3)   Recent Labs  03/13/16 1131 03/13/16 1642 03/13/16 2044  GLUCAP 288* 234* 193*  10. Acute on chronic renal failure:  BUN/Cr- 23/1.68 at admission--> 25/2.03 post cath.   11. ABLA:  Hemoglobin 9.8 on  5/5. 12. Second-degree heart block  Being followed by cardiology, appreciate recs.  Daily ECG  Appreciate cardiology note  LOS (Days) 5 A FACE TO FACE EVALUATION WAS PERFORMED  KIRSTEINS,ANDREW E 03/14/2016, 6:38 AM

## 2016-03-14 NOTE — Progress Notes (Signed)
Patient has home unit CPAP. Patient and his wife are able to put his mask own. Patient needs no assistance in putting on mask. RT will continue to monitor.

## 2016-03-14 NOTE — Progress Notes (Signed)
Speech Language Pathology Daily Session Note  Patient Details  Name: Carlos Wolf MRN: [id number] Date of Birth: 1941-05-12  Today's Date: 03/14/2016 SLP Individual Time: 1445-1530 SLP Individual Time Calculation (min): 45 min  Short Term Goals: Week 1: SLP Short Term Goal 1 (Week 1): Pt will use external memory aids to facilitate recall of daily information with min assist verbal cues.  SLP Short Term Goal 2 (Week 1): Pt will complete familiar semi-complex tasks with min assist verbal cues for functional problem solving.  SLP Short Term Goal 3 (Week 1): Pt will identify at least 2 deficits s/p CVA wtih min assist question cues.    Skilled Therapeutic Interventions:  Pt was seen for skilled ST targeting cognitive goals.  Session began late as therapist noted pt to be bleeding from penis while on the commode and required 15 minutes of nursing care.  See RN note.  SLP facilitated the session with a novel card game targeting functional problem solving.  Pt was able to plan and execute a problem solving strategy following initial instruction with overall min assist verbal cues for working memory of task protocols and procedures.  Pt recalled targeted personally relevant information after a ~15 minute delay with supervision question cues.  Mod assist question cues were needed for route recall when returning to room from [place] treatment room.  Pt was returned to room at the end of today's therapy session and left with call bell within reach and quick release belt donned.  Continue per current plan of care.    Function:  Eating Eating   Modified Consistency Diet: No Eating Assist Level: Set up assist for   Eating Set Up Assist For: Opening containers       Cognition Comprehension Comprehension assist level: Follows basic conversation/direction with no assist  Expression   Expression assist level: Expresses basic needs/ideas: With no assist  Social Interaction Social Interaction assist level:  Interacts appropriately 90% of the time - Needs monitoring or encouragement for participation or interaction.  Problem Solving Problem solving assist level: Solves basic 90% of the time/requires cueing < 10% of the time  Memory Memory assist level: Recognizes or recalls 75 - 89% of the time/requires cueing 10 - 24% of the time    Pain Pain Assessment Pain Assessment: No/denies pain  Therapy/Group: Individual Therapy  Tanicia Wolaver, Selinda Orion 03/14/2016, 4:23 PM

## 2016-03-14 NOTE — Progress Notes (Signed)
Patient Profile: 75 yo man with PMH of CAD s/p CABG 2002 at [facility], T2DM, dyslipidemia, hypertension, colon cancer s/p surgery 01/2014 who presented with chest discomfort. He ruled in for NSTEMI.   Interval History:  LHC 03/03/16 showed 3/4 patent grafts (LIMA-LAD, SVG-Diag1, SVG-OM1). There was an atretic and probable recent occlusion of a small SVG which had supplied the PDA. Medical therapy recommended.  Post cath, patient developed AMS, confusion and slurred speach. Brain MRI 03/04/16 with small acute posterior circulation infarcts in the right occipital lobe, corpus callosum, and pons.   EKG 03/05/16 ~8:00 PM showed complete heart block with an escape rate of 69 and a wide-complex rhythm either an accelerated idioventricular rhythm or an IV conduction delay. Transient episodes but asymptomatic. Seen by EP 03/07/16. AV conduction abnormality felt to be secondary to recent inferior MI and likely to improve over time. Patient and his wife are clear that they would like to avoid PPM insertion if possible.    Subjective: Patient had brief (few minutes) of chest pain last PM; none this AM; no dyspnea.  Objective: Vital signs in last 24 hours: Temp:  [98 F (36.7 C)-98.2 F (36.8 C)] 98.2 F (36.8 C) (05/09 0628) Pulse Rate:  [98-103] 102 (05/09 0628) Resp:  [17-18] 17 (05/09 0628) BP: (100-104)/(56-79) 100/56 mmHg (05/09 0628) SpO2:  [97 %-100 %] 99 % (05/09 0628) Weight:  [166 lb 0.1 oz (75.3 kg)] 166 lb 0.1 oz (75.3 kg) (05/09 0628) Last BM Date: 03/13/16  Intake/Output from previous day: 05/08 0701 - 05/09 0700 In: 720 [P.O.:720] Out: -  Intake/Output this shift:    Medications Current Facility-Administered Medications  Medication Dose Route Frequency Provider Last Rate Last Dose  . acetaminophen (TYLENOL) tablet 650 mg  650 mg Oral Q4H PRN Bary Leriche, PA-C   650 mg at 03/10/16 1535  . aspirin EC tablet 81 mg  81 mg Oral QHS Bary Leriche, PA-C   81 mg at 03/13/16 2108  .  atorvastatin (LIPITOR) tablet 80 mg  80 mg Oral q1800 Ivan Anchors Love, PA-C   80 mg at 03/13/16 1800  . cholecalciferol (VITAMIN D) tablet 5,000 Units  5,000 Units Oral Daily Bary Leriche, PA-C   5,000 Units at 03/13/16 [id number]  . clopidogrel (PLAVIX) tablet 75 mg  75 mg Oral Daily Bary Leriche, PA-C   75 mg at 03/13/16 [id number]  . diphenhydrAMINE (BENADRYL) capsule 25 mg  25 mg Oral QHS Ivan Anchors Love, PA-C   25 mg at 03/13/16 2108  . enoxaparin (LOVENOX) injection 40 mg  40 mg Subcutaneous Q24H Charlett Blake, MD   40 mg at 03/13/16 [id number]  . insulin aspart (novoLOG) injection 0-24 Units  0-24 Units Subcutaneous TID WC & HS Bary Leriche, PA-C   4 Units at 03/13/16 2108  . insulin aspart (novoLOG) injection 5 Units  5 Units Subcutaneous TID WC Ankit Lorie Phenix, MD   5 Units at 03/11/16 0800  . insulin detemir (LEVEMIR) injection 20 Units  20 Units Subcutaneous QHS Bary Leriche, PA-C   20 Units at 03/13/16 2108  . isosorbide mononitrate (IMDUR) 24 hr tablet 30 mg  30 mg Oral Daily Bary Leriche, PA-C   30 mg at 03/13/16 [id number]  . metFORMIN (GLUCOPHAGE) tablet 850 mg  850 mg Oral BID WC Ivan Anchors Love, PA-C   850 mg at 03/13/16 1742  . multivitamin with minerals tablet 1 tablet  1 tablet Oral Daily Bary Leriche, PA-C  1 tablet at 03/13/16 [id number]  . nitroGLYCERIN (NITROSTAT) SL tablet 0.4 mg  0.4 mg Sublingual Q5 Min x 3 PRN Bary Leriche, PA-C   0.4 mg at 03/11/16 1928  . pantoprazole (PROTONIX) EC tablet 40 mg  40 mg Oral Daily Bary Leriche, PA-C   40 mg at 03/13/16 [id number]  . traZODone (DESYREL) tablet 50 mg  50 mg Oral QHS PRN Charlett Blake, MD   25 mg at 03/12/16 2123    PE: BP 100/56 mmHg  Pulse 102  Temp(Src) 98.2 F (36.8 C) (Oral)  Resp 17  Wt 166 lb 0.1 oz (75.3 kg)  SpO2 99%  WD/WN, NAD He is awake and alert.  HEENT-scar forehead  Neck supple Chest CTA Cardiac exam RRR Abd: soft Extremities reveal no edema Neuro exam moves all ext  Lab Results:  No results for input(s): WBC,  HGB, HCT, PLT in the last 72 hours. BMET  Recent Labs  03/13/16 0520  NA 133*  K 4.2  CL 99*  CO2 23  GLUCOSE 202*  BUN 17  CREATININE 1.57*  CALCIUM 8.4*     Assessment/Plan  Active Problems:   Stroke due to embolism of right posterior cerebral artery (HCC)   Gait disturbance, post-stroke   Cognitive deficit, post-stroke   Mobitz type 2 second degree heart block   Diabetes mellitus type 2 in nonobese (HCC)   Mobitz (type) I (Wenckebach's) atrioventricular block   Tachycardia   1. NSTEMI: s/p cath w/ med rx - on ASA, Plavix, high-dose statin, Imdur,  - No BB given HB/ bradycardia;   He has tried low dose metoprolol in the past - even tried metoprol 12.5 mg BID Will give Coreg 3.125 QD to see if this helps Will DC the Imdur given his low / normal BP    2. CVA:  - MRI c/w small acute posterior circulation infarcts in the right occipital lobe, corpus callosum and pons.  - Now in rehabilitation. Felt secondary to cardiac catheterization.  3. Transient CHB:  - Seen by EP 03/07/16. AV conduction abnormalities felt to be secondary to recent inferior MI. EKG today shows sinus with first-degree AV block.   Will consider low dose Coreg if he remains tachycardic     LOS: 5 days     Charlesia Canaday, Wonda Cheng, MD  03/14/2016 8:57 AM    [facility] [place],  [place], [place]  [address] Pager [phone] Phone: [phone]; Fax: [phone]   [facility]  [address] [place], [place]  [address] [phone]   Fax [phone]

## 2016-03-15 ENCOUNTER — Inpatient Hospital Stay (HOSPITAL_COMMUNITY): Payer: Medicare Other | Admitting: Physical Therapy

## 2016-03-15 ENCOUNTER — Inpatient Hospital Stay (HOSPITAL_COMMUNITY): Payer: Medicare Other | Admitting: Occupational Therapy

## 2016-03-15 ENCOUNTER — Inpatient Hospital Stay (HOSPITAL_COMMUNITY): Payer: Medicare Other | Admitting: Speech Pathology

## 2016-03-15 LAB — GLUCOSE, CAPILLARY
GLUCOSE-CAPILLARY: 83 mg/dL (ref 65–99)
GLUCOSE-CAPILLARY: 173 mg/dL — AB (ref 65–99)
GLUCOSE-CAPILLARY: 95 mg/dL (ref 65–99)
Glucose-Capillary: 106 mg/dL — ABNORMAL HIGH (ref 65–99)

## 2016-03-15 MED ORDER — METFORMIN HCL 500 MG PO TABS
500.0000 mg | ORAL_TABLET | Freq: Two times a day (BID) | ORAL | Status: DC
  Administered 2016-03-15 (×2): 500 mg via ORAL
  Filled 2016-03-15 (×2): qty 1

## 2016-03-15 MED ORDER — CARVEDILOL 3.125 MG PO TABS
3.1250 mg | ORAL_TABLET | Freq: Two times a day (BID) | ORAL | Status: DC
  Administered 2016-03-15: 3.125 mg via ORAL
  Filled 2016-03-15: qty 1

## 2016-03-15 NOTE — Progress Notes (Signed)
Social Work Patient ID: Carlos Wolf, male   DOB: [birth year], 75 y.o.   MRN: [id number] Met with pt and spoke with wife to inform of team conference goals-supervision level due to cognition and memory issues. Target discharge 5/17. Both are agreeable to the plan. Pt was complaining of chest tightness and have informed Ed-RN of this and he will check him out. Wife would like [facility] since she and husband have used them before. Will make referral. Will continue to work on discharge plans. Have faxed information to the insurance-[employer] and am awaiting response.

## 2016-03-15 NOTE — Progress Notes (Addendum)
Subjective/Complaints: Wife was trying to snip something on scrotum but caused cut addressed by RN yesterday  ROS- denies CP, SOB, nausea, vomiting, diarrhea  Objective: Vital Signs: Blood pressure 104/60, pulse 101, temperature 98 F (36.7 C), temperature source Oral, resp. rate 19, weight 78 kg (171 lb 15.3 oz), SpO2 92 %. No results found. Results for orders placed or performed during the hospital encounter of 03/12/2016 (from the past 72 hour(s))  Glucose, capillary     Status: Abnormal   Collection Time: 03/12/16 11:54 AM  Result Value Ref Range   Glucose-Capillary 314 (H) 65 - 99 mg/dL   Comment 1 Notify RN   Glucose, capillary     Status: Abnormal   Collection Time: 03/12/16  4:59 PM  Result Value Ref Range   Glucose-Capillary 211 (H) 65 - 99 mg/dL  Glucose, capillary     Status: Abnormal   Collection Time: 03/12/16  9:05 PM  Result Value Ref Range   Glucose-Capillary 180 (H) 65 - 99 mg/dL  Basic metabolic panel     Status: Abnormal   Collection Time: 03/13/16  5:20 AM  Result Value Ref Range   Sodium 133 (L) 135 - 145 mmol/L   Potassium 4.2 3.5 - 5.1 mmol/L   Chloride 99 (L) 101 - 111 mmol/L   CO2 23 22 - 32 mmol/L   Glucose, Bld 202 (H) 65 - 99 mg/dL   BUN 17 6 - 20 mg/dL   Creatinine, Ser 1.57 (H) 0.61 - 1.24 mg/dL   Calcium 8.4 (L) 8.9 - 10.3 mg/dL   GFR calc non Af Amer 41 (L) >60 mL/min   GFR calc Af Amer 48 (L) >60 mL/min    Comment: (NOTE) The eGFR has been calculated using the CKD EPI equation. This calculation has not been validated in all clinical situations. eGFR's persistently <60 mL/min signify possible Chronic Kidney Disease.    Anion gap 11 5 - 15  Glucose, capillary     Status: Abnormal   Collection Time: 03/13/16  6:46 AM  Result Value Ref Range   Glucose-Capillary 174 (H) 65 - 99 mg/dL  Glucose, capillary     Status: Abnormal   Collection Time: 03/13/16 11:31 AM  Result Value Ref Range   Glucose-Capillary 288 (H) 65 - 99 mg/dL   Comment 1  Notify RN   Glucose, capillary     Status: Abnormal   Collection Time: 03/13/16  4:42 PM  Result Value Ref Range   Glucose-Capillary 234 (H) 65 - 99 mg/dL   Comment 1 Notify RN   Glucose, capillary     Status: Abnormal   Collection Time: 03/13/16  8:44 PM  Result Value Ref Range   Glucose-Capillary 193 (H) 65 - 99 mg/dL  Glucose, capillary     Status: Abnormal   Collection Time: 03/14/16  6:31 AM  Result Value Ref Range   Glucose-Capillary 109 (H) 65 - 99 mg/dL  Glucose, capillary     Status: Abnormal   Collection Time: 03/14/16 12:05 PM  Result Value Ref Range   Glucose-Capillary 190 (H) 65 - 99 mg/dL  Glucose, capillary     Status: Abnormal   Collection Time: 03/14/16  4:43 PM  Result Value Ref Range   Glucose-Capillary 121 (H) 65 - 99 mg/dL  Glucose, capillary     Status: Abnormal   Collection Time: 03/14/16  9:12 PM  Result Value Ref Range   Glucose-Capillary 169 (H) 65 - 99 mg/dL  Glucose, capillary     Status:  None   Collection Time: 03/15/16  6:43 AM  Result Value Ref Range   Glucose-Capillary 83 65 - 99 mg/dL    HEENT: Normocephalic, atraumatic Cardio: +Tachycardia. Regular rhythm and no murmur Resp: CTA B/L and unlabored GI: BS positive and non tender Musculoskeletal:  No edema. No tenderness.  Skin:   Intact. Warm and dry Neuro: Alert/Oriented x3 Dysarthria  Motor 4+/5 in BUE and BLE Gen NAD. Vital signs reviewed  Assessment/Plan: 1. 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 Physiatrist is providing close team supervision and 24 hour management of active medical problems listed below. Physiatrist and rehab team continue to assess barriers to discharge/monitor patient progress toward functional and medical goals. FIM: Function - Bathing Bathing activity did not occur:  Refused Position: Shower Body parts bathed by patient: Right arm, Left arm, Chest, Front perineal area, Abdomen, Buttocks, Right upper leg, Left upper leg Body parts bathed by helper: Right lower leg, Left lower leg, Back Assist Level: Touching or steadying assistance(Pt > 75%) (min assist for safety with sit to/from stands)  Function- Upper Body Dressing/Undressing What is the patient wearing?: Button up shirt Pull over shirt/dress - Perfomed by patient: Thread/unthread right sleeve, Thread/unthread left sleeve, Pull shirt over trunk Pull over shirt/dress - Perfomed by helper: Put head through opening Button up shirt - Perfomed by patient: Thread/unthread right sleeve, Thread/unthread left sleeve, Pull shirt around back, Button/unbutton shirt Button up shirt - Perfomed by helper: Button/unbutton shirt Assist Level: Set up, Supervision or verbal cues (cues for safety, cues to sit and not stand when donning shirt for patient's safety) Set up : To obtain clothing/put away Function - Lower Body Dressing/Undressing What is the patient wearing?: Socks, Shoes Position: Wheelchair/chair at sink Pants- Performed by patient: Pull pants up/down, Thread/unthread right pants leg, Thread/unthread left pants leg, Fasten/unfasten pants Pants- Performed by helper: Thread/unthread right pants leg, Thread/unthread left pants leg Non-skid slipper socks- Performed by helper: Don/doff right sock, Don/doff left sock Socks - Performed by helper: Don/doff right sock, Don/doff left sock Shoes - Performed by helper: Don/doff right shoe, Don/doff left shoe, Fasten right, Fasten left Assist for footwear: Dependant Assist for lower body dressing: Touching or steadying assistance (Pt > 75%) (min guard/assist during sit to/from stands and for standing balance when pulling up his pants)  Function - Toileting Toileting steps completed by patient: Adjust clothing prior to toileting, Performs perineal hygiene, Adjust  clothing after toileting Toileting steps completed by helper: Adjust clothing prior to toileting, Performs perineal hygiene, Adjust clothing after toileting (per [employer], NT) Toileting Assistive Devices: Grab bar or rail Assist level: Touching or steadying assistance (Pt.75%)  Function - Air cabin crew transfer assistive device: Grab bar, Walker Assist level to toilet: Touching or steadying assistance (Pt > 75%) Assist level from toilet: Touching or steadying assistance (Pt > 75%)  Function - Chair/bed transfer Chair/bed transfer method: Stand pivot Chair/bed transfer assist level: Touching or steadying assistance (Pt > 75%) Chair/bed transfer assistive device: Bedrails, Armrests Chair/bed transfer details: Manual facilitation for placement, Verbal cues for technique, Verbal cues for sequencing, Manual facilitation for weight shifting  Function - Locomotion: Wheelchair Will patient use wheelchair at discharge?: No Type: Manual Max wheelchair distance: 150 Assist Level: Supervision or verbal cues Assist Level: Supervision or verbal cues Assist Level: Supervision or verbal cues Turns around,maneuvers to table,bed, and toilet,negotiates 3%  grade,maneuvers on rugs and over doorsills: No Function - Locomotion: Ambulation Assistive device: Walker-rolling Max distance: 150 Assist level: Touching or steadying assistance (Pt > 75%) Assist level: Touching or steadying assistance (Pt > 75%) Assist level: Touching or steadying assistance (Pt > 75%) Assist level: Touching or steadying assistance (Pt > 75%) Assist level: Moderate assist (Pt 50 - 74%)  Function - Comprehension Comprehension: Auditory Comprehension assist level: Follows basic conversation/direction with no assist  Function - Expression Expression: Verbal Expression assist level: Expresses basic needs/ideas: With no assist  Function - Social Interaction Social Interaction assist level: Interacts appropriately 90%  of the time - Needs monitoring or encouragement for participation or interaction.  Function - Problem Solving Problem solving assist level: Solves basic 90% of the time/requires cueing < 10% of the time  Function - Memory Memory assist level: Recognizes or recalls 75 - 89% of the time/requires cueing 10 - 24% of the time Patient normally able to recall (first 3 days only): Current season, Staff names and faces, That he or she is in a hospital  Medical Problem List and Plan: 1.  Gait disorder, left homonymous hemianopsia secondary to Right PCA distribution embolic infarcts  Continue CIR, Team conference today please see physician documentation under team conference tab, met with team face-to-face to discuss problems,progress, and goals. Formulized individual treatment plan based on medical history, underlying problem and comorbidities.        2.  DVT Prophylaxis/Anticoagulation: Pharmaceutical: Lovenox 3. Pain Management:  Tylenol prn for HA effective.   4. Mood:  Wife has been supportive and present during the stay. LCSW to follow for evaluation and support.  Insomnia improved with benadryl , monitor for urinary retention and confusion 5. Neuropsych: This patient is capable of making decisions on his  own behalf. 6. Skin/Wound Care: Routine pressure relief measures.   7. Fluids/Electrolytes/Nutrition: Monitor I/O . Hx of CKD- repeat BMET in am to monitor creat on metformin  BMP Latest Ref Rng 03/13/2016 03/10/2016 03/05/2016  Glucose 65 - 99 mg/dL 202(H) 137(H) 230(H)  BUN 6 - 20 mg/dL 17 31(H) 25(H)  Creatinine 0.61 - 1.24 mg/dL 1.57(H) 1.79(H) 2.03(H)  Sodium 135 - 145 mmol/L 133(L) 138 136  Potassium 3.5 - 5.1 mmol/L 4.2 4.0 4.0  Chloride 101 - 111 mmol/L 99(L) 99(L) 100(L)  CO2 22 - 32 mmol/L '23 25 24  ' Calcium 8.9 - 10.3 mg/dL 8.4(L) 8.4(L) 8.2(L)    8. CAD s/pCABG/NSTEMI: To treat medically. On Imdur, Plavix, ASA and Lipitor. .   9. T2DM:  Hgb A1c-8.4. Will monitor BS ac/hs.   BMET  improved metformin restarted    BS poorly controlled.   Pt still on [place], wife brought in [place], cont levimir to 20Units  NovoLog 5 units added with meals  (Used Tresiba 25 units for BS,200 and 35 units for BS> 200--will ask wife to bring from home.)  CBG (last 3)   Recent Labs  03/14/16 1643 03/14/16 2112 03/15/16 0643  GLUCAP 121* 169* 83  10. Acute on chronic renal failure:  BUN/Cr- 23/1.68 at admission--> 25/2.03 post cath.   11. ABLA:  Hemoglobin 9.8 on 5/5. 12. Second-degree heart block  Being followed by cardiology, appreciate recs.  Daily ECG  Appreciate cardiology note- Dr Acie Fredrickson 13.  Episode of diarrhea,? related to metformin will reduce dose to 547m LOS (Days) 6 A FACE TO FACE EVALUATION WAS PERFORMED  KIRSTEINS,ANDREW E 03/15/2016, 7:16 AM

## 2016-03-15 NOTE — Progress Notes (Signed)
Occupational Therapy Session Note  Patient Details  Name: Carlos Wolf MRN: [id number] Date of Birth: 1941/07/16  Today's Date: 03/15/2016 OT Individual Time: [id number] Individual Time Calculation (min): 70 min    Short Term Goals: Week 1:  OT Short Term Goal 1 (Week 1): Pt will complete walk-in shower transfer with min assist OT Short Term Goal 2 (Week 1): Pt will complete bathing with min assit OT Short Term Goal 3 (Week 1): Pt will complete toilet transfers with min assist OT Short Term Goal 4 (Week 1): Pt will locate items on Lt during grooming tasks with min question cues OT Short Term Goal 5 (Week 1): Pt will complete 2 grooming tasks in standing with min assist for balance  Skilled Therapeutic Interventions/Progress Updates:    Treatment session with focus on dynamic balance, working memory, and UE control.  Pt in bed upon arrival reporting not feeling well.  Pt reported chest pain and taking "nitro" with relief within 30 mins but now feeling cold and "in a valley".  Max encouragement to participate in any type of activity.  Pt refused self-care tasks and ambulation.  Engaged in table top activity in sitting with focus on recall of previously learned activity in order to instruct therapist.  Pt required min-mod question cues for recall initially faded to min throughout task.  Pt reports decreased trunk control in sitting, however with no LOB throughout activity.  Pt was willing to get OOB at end of session.  Performed stand pivot transfer bed > recliner with min assist and cues for technique.  Therapy Documentation Precautions:  Precautions Precautions: Fall Precaution Comments: 5 falls in past year, has been weak since March 2016 with coloscopy; L visual field cut - WATCH HR Restrictions Weight Bearing Restrictions: No General:   Vital Signs: Therapy Vitals Temp: 98.4 F (36.9 C) Temp Source: Oral Pulse Rate: (!) 104 Resp: 18 BP: 105/88 mmHg Patient Position (if  appropriate): Sitting Oxygen Therapy SpO2: 99 % O2 Device: Not Delivered Pain:  Pt with no c/o pain  See Function Navigator for Current Functional Status.   Therapy/Group: Individual Therapy  Simonne Come 03/15/2016, 3:12 PM

## 2016-03-15 NOTE — Progress Notes (Signed)
Physical Therapy Session Note  Patient Details  Name: Carlos Wolf MRN: [id number] Date of Birth: 20-Aug-1941  Today's Date: 03/15/2016 PT Individual Time: 0900-1000 PT Individual Time Calculation (min): 60 min   Short Term Goals: Week 1:  PT Short Term Goal 1 (Week 1): Pt will perform stand pivot transfer with S PT Short Term Goal 2 (Week 1): Pt will perform gait with min guard x150' PT Short Term Goal 3 (Week 1): Pt will perform initiate stair training PT Short Term Goal 4 (Week 1): Pt will perform dynamic standing balance of >5 min with min guard  Skilled Therapeutic Interventions/Progress Updates:    Pt received seated in recliner, denies pain but does report "I feel off today". Gait to gym with RW and min guard, occasional staggering and cues for maintaining RW in front of body. Dynamic standing balance on foam while BUE engaged in pipe tree for coordination, problem solving, L scanning. Requires minA initially with several posterior LOB requiring maxA to recover; improved to standbyA after several minutes and able to engaged BUEs in task providing internal perturbations and postural adjustments. Standing alternating toe taps to 4" step with mod/maxA initially, again decreased to CGA with repetition and improved weight shifting, single limb stance. Gait sideways/backwards in parallel bars with BUE support faded to no UEs; performed for weight shifting, SLS stance control, ankle strategy. Standing on rocker board with ball toss; several LOB posteriorly with max/totalA and UEs to correct. Improved with repetition and able to hold statically, but continues to have LOBs with dynamic UE movements. Gait to return to room with RW and S. Remained seated in recliner with quick release belt intact and all needs within reach.   Therapy Documentation Precautions:  Precautions Precautions: Fall Precaution Comments: 5 falls in past year, has been weak since March 2016 with coloscopy; L visual field cut -  WATCH HR Restrictions Weight Bearing Restrictions: No Pain: Pain Assessment Pain Assessment: No/denies pain   See Function Navigator for Current Functional Status.   Therapy/Group: Individual Therapy  Luberta Mutter 03/15/2016, 10:22 AM

## 2016-03-15 NOTE — Progress Notes (Signed)
Patient Profile: 75 yo man with PMH of CAD s/p CABG 2002 at [facility], T2DM, dyslipidemia, hypertension, colon cancer s/p surgery 01/2014 who presented with chest discomfort. He ruled in for NSTEMI.   Interval History:  LHC 03/03/16 showed 3/4 patent grafts (LIMA-LAD, SVG-Diag1, SVG-OM1). There was an atretic and probable recent occlusion of a small SVG which had supplied the PDA. Medical therapy recommended.  Post cath, patient developed AMS, confusion and slurred speach. Brain MRI 03/04/16 with small acute posterior circulation infarcts in the right occipital lobe, corpus callosum, and pons.   EKG 03/05/16 ~8:00 PM showed complete heart block with an escape rate of 69 and a wide-complex rhythm either an accelerated idioventricular rhythm or an IV conduction delay. Transient episodes but asymptomatic. Seen by EP 03/07/16. AV conduction abnormality felt to be secondary to recent inferior MI and likely to improve over time. Patient and his wife are clear that they would like to avoid PPM insertion if possible.    Subjective: Patient currently in the middle of his morning PT session. He is doing well. He denies any CP, dyspnea, lightheadedness, dizziness, syncope/ near syncope. He is doing well with his therapy.   Objective: Vital signs in last 24 hours: Temp:  [98 F (36.7 C)-98.4 F (36.9 C)] 98 F (36.7 C) (05/10 0513) Pulse Rate:  [94-101] 101 (05/10 0513) Resp:  [16-19] 19 (05/10 0513) BP: (104-122)/(44-60) 104/60 mmHg (05/10 0513) SpO2:  [92 %-98 %] 92 % (05/10 0513) Weight:  [171 lb 15.3 oz (78 kg)] 171 lb 15.3 oz (78 kg) (05/10 0513) Last BM Date: 03/14/16  Intake/Output from previous day: 05/09 0701 - 05/10 0700 In: 480 [P.O.:480] Out: -  Intake/Output this shift:    Medications Current Facility-Administered Medications  Medication Dose Route Frequency Provider Last Rate Last Dose  . acetaminophen (TYLENOL) tablet 650 mg  650 mg Oral Q4H PRN Bary Leriche, PA-C   650 mg at  03/10/16 1535  . aspirin EC tablet 81 mg  81 mg Oral QHS Bary Leriche, PA-C   81 mg at 03/14/16 2105  . atorvastatin (LIPITOR) tablet 80 mg  80 mg Oral q1800 Ivan Anchors Love, PA-C   80 mg at 03/14/16 1740  . carvedilol (COREG) tablet 3.125 mg  3.125 mg Oral Daily Thayer Headings, MD   3.125 mg at 03/14/16 0915  . cholecalciferol (VITAMIN D) tablet 5,000 Units  5,000 Units Oral Daily Bary Leriche, PA-C   5,000 Units at 03/14/16 [id number]  . clopidogrel (PLAVIX) tablet 75 mg  75 mg Oral Daily Bary Leriche, PA-C   75 mg at 03/14/16 [id number]  . diphenhydrAMINE (BENADRYL) capsule 25 mg  25 mg Oral QHS Ivan Anchors Love, PA-C   25 mg at 03/14/16 2105  . enoxaparin (LOVENOX) injection 40 mg  40 mg Subcutaneous Q24H Charlett Blake, MD   40 mg at 03/14/16 [id number]  . insulin aspart (novoLOG) injection 0-24 Units  0-24 Units Subcutaneous TID WC & HS Bary Leriche, PA-C   4 Units at 03/14/16 2113  . insulin aspart (novoLOG) injection 5 Units  5 Units Subcutaneous TID WC Ankit Lorie Phenix, MD   5 Units at 03/11/16 0800  . insulin detemir (LEVEMIR) injection 20 Units  20 Units Subcutaneous QHS Bary Leriche, PA-C   20 Units at 03/14/16 2200  . metFORMIN (GLUCOPHAGE) tablet 500 mg  500 mg Oral BID WC Charlett Blake, MD      . multivitamin with minerals tablet  1 tablet  1 tablet Oral Daily Bary Leriche, PA-C   1 tablet at 03/14/16 [id number]  . nitroGLYCERIN (NITROSTAT) SL tablet 0.4 mg  0.4 mg Sublingual Q5 Min x 3 PRN Bary Leriche, PA-C   0.4 mg at 03/11/16 1928  . pantoprazole (PROTONIX) EC tablet 40 mg  40 mg Oral Daily Bary Leriche, PA-C   40 mg at 03/14/16 0902    PE: BP 104/60 mmHg  Pulse 101  Temp(Src) 98 F (36.7 C) (Oral)  Resp 19  Wt 171 lb 15.3 oz (78 kg)  SpO2 92%  Physical Exam WD/WN, NAD He is awake and alert.  HEENT-scar forehead Neck supple Chest CTA Cardiac exam RRR Abd: soft Extremities reveal no edema Neuro exam moves all ext  Lab Results:  No results for input(s): WBC, HGB, HCT, PLT  in the last 72 hours. BMET  Recent Labs  03/13/16 0520  NA 133*  K 4.2  CL 99*  CO2 23  GLUCOSE 202*  BUN 17  CREATININE 1.57*  CALCIUM 8.4*     Assessment/Plan  Active Problems:   Stroke due to embolism of right posterior cerebral artery (HCC)   Gait disturbance, post-stroke   Cognitive deficit, post-stroke   Mobitz type 2 second degree heart block   Diabetes mellitus type 2 in nonobese (HCC)   Mobitz (type) I (Wenckebach's) atrioventricular block   Tachycardia   Coronary artery disease involving native coronary artery of native heart with angina pectoris with documented spasm ([place])   1. NSTEMI: s/p cath w/ med rx - on ASA, Plavix, high-dose statin -BB has been used cautiously given HB/ bradycardia;   He has tried low dose metoprolol in the past - even tried metoprol 12.5 mg BID -Low dose Coreg 3.125 QD was added 5/9. Tolerating well. No further bradycardia.  - Imdur was discontinued given his low / normal BP    2. CVA:  - MRI c/w small acute posterior circulation infarcts in the right occipital lobe, corpus callosum and pons.  - Now in rehabilitation. Felt secondary to cardiac catheterization. - Patient has made significant improvement. Speech is improved, he is more conversant. Ambulating with walker.   3. Transient CHB:  - Seen by EP 03/07/16. AV conduction abnormalities felt to be secondary to recent inferior MI. EKG today shows sinus with first-degree AV block.   -Low dose Coreg was added 5/9 given tachycardia. He is tolerating well. Vital signs were reviewed along with his RN. HR has averaged in the upper 90s- low 100s. No further bradycardia.     LOS: 6 days     Lyda Jester, PA-C  03/15/2016 9:02 AM      Attending Note:   The patient was seen and examined.  Agree with assessment and plan as noted above.  Changes made to the above note as needed.  He seems to be doing well.  Tolerating the coreg 3.125 a day  Will increase to coreg 3.125  bid  Thayer Headings, Brooke Bonito., MD, [facility] 03/15/2016, 7:04 PM [address],  [place] Pager [phone]

## 2016-03-15 NOTE — Progress Notes (Signed)
Speech Language Pathology Daily Session Note  Patient Details  Name: Carlos Wolf MRN: [id number] Date of Birth: [birth year]  Today's Date: 03/15/2016 SLP Individual Time: 1430-1530 SLP Individual Time Calculation (min): 60 min  Short Term Goals: Week 1: SLP Short Term Goal 1 (Week 1): Pt will use external memory aids to facilitate recall of daily information with min assist verbal cues.  SLP Short Term Goal 2 (Week 1): Pt will complete familiar semi-complex tasks with min assist verbal cues for functional problem solving.  SLP Short Term Goal 3 (Week 1): Pt will identify at least 2 deficits s/p CVA wtih min assist question cues.    Skilled Therapeutic Interventions:  Pt was seen for skilled ST targeting cognitive goals.  Pt was able to recall specific time of therapy and therapy discipline upon therapist's arrival without prompting.  Pt also recalled discharge date with supervision question cues.  Pt was able to sequence familiar self care tasks to change into clean pants after spilling urinal with min assist multimodal cues.  Pt was able to identify at least 2 deficits occurring s/p CVA with supervision question cues. Pt was able to recall route to room from [place] treatment room with min assist verbal cues for use of environmental aids.   Inattention to the left noted while ambulating around his room as pt was noted to walk past recliner despite pt being familiar with recliner's location. Pt was left in recliner with quick release belt donned for safety and call bell within reach.  Continue per current plan of care.    Function:  Eating Eating                 Cognition Comprehension Comprehension assist level: Follows basic conversation/direction with no assist  Expression   Expression assist level: Expresses basic needs/ideas: With no assist  Social Interaction Social Interaction assist level: Interacts appropriately 90% of the time - Needs monitoring or encouragement for participation  or interaction.  Problem Solving Problem solving assist level: Solves basic 75 - 89% of the time/requires cueing 10 - 24% of the time  Memory Memory assist level: Recognizes or recalls 50 - 74% of the time/requires cueing 25 - 49% of the time    Pain Pain Assessment Pain Assessment: No/denies pain  Therapy/Group: Individual Therapy  [name], Selinda Orion 03/15/2016, 3:50 PM

## 2016-03-15 NOTE — Patient Care Conference (Signed)
Inpatient RehabilitationTeam Conference and Plan of Care Update Date: 03/15/2016   Time: 11:00 AM    Patient Name: Carlos Wolf      Medical Record Number: [id number]  Date of Birth: October 13, 1941 Sex: Male         Room/Bed: 4M02C/4M02C-01 Payor Info: Payor: [place] / Plan: [employer] MEDICARE / Product Type: *No Product type* /    Admitting Diagnosis: R CVA  Admit Date/Time:  03/17/2016  4:07 PM Admission Comments: No comment available   Primary Diagnosis:  <principal problem not specified> Principal Problem: <principal problem not specified>  Patient Active Problem List   Diagnosis Date Noted  . Coronary artery disease involving native coronary artery of native heart with angina pectoris with documented spasm ([place])   . Diabetes mellitus type 2 in nonobese (HCC)   . Mobitz (type) I (Wenckebach's) atrioventricular block   . Tachycardia   . Gait disturbance, post-stroke 03/10/2016  . Cognitive deficit, post-stroke 03/10/2016  . Mobitz type 2 second degree heart block   . Stroke due to embolism of right posterior cerebral artery ([place]) 03/26/2016  . Complete heart block ([place])   . Stroke ([place])   . Stroke (cerebrum) ([place])   . Altered mental state   . Unstable angina ([place])   . NSTEMI (non-ST elevated myocardial infarction) ([place]) 03/02/2016  . Dyslipidemia 03/02/2016  . Weakness 01/13/2015  . Multiple falls 01/13/2015  . Physical deconditioning 01/13/2015  . Essential hypertension 01/13/2015  . GERD (gastroesophageal reflux disease) 01/13/2015  . Diabetes mellitus ([place]) 02/04/2013  . Rectal cancer ([place]) 01/16/2013  . Benign neoplasm of colon 01/16/2013  . Guaiac positive stools 11/28/2012  . Melena 11/28/2012  . High cholesterol   . Acid reflux   . Skin cancer     Expected Discharge Date: Expected Discharge Date: 03/22/16  Team Members Present: Physician leading conference: Dr. Alysia Penna Social Worker Present: Ovidio Kin, LCSW Nurse Present: Dorien Chihuahua, RN PT Present: Kem Parkinson, PT OT Present: Simonne Come, OT SLP Present: Windell Moulding, SLP PPS Coordinator present : Daiva Nakayama, RN, CRRN     Current Status/Progress Goal Weekly Team Focus  Medical   cardiac status stable , cardiology on board, ? decreased awareness on Left  maintain medical stability  d/c planning   Bowel/Bladder   Can be incontinent of bowel and bladder intermittently. LBM 03/15/16  Managed bowel and bladder  Encourage timed toileting q 2-3hrs   Swallow/Nutrition/ Hydration             ADL's   min guard overall         Mobility   min guard transfers, gait with RW, S stairs  mod I bed mobility, transfers, gait in home, S car transfers, community mobility, and stairs  dynamic standing balance, endurance, gait training, LE strengthening   Communication             Safety/Cognition/ Behavioral Observations  Mild cognitive deficits, specifically for short term memory and awareness of deficits   supervision   recall of new information, use of compensatory strategies, emergent/anticipatory awareness of deficits    Pain   No c/o pain  <3  Monitor for nonverbal cues of pain   Skin   Skin tear to foreskin, Abrasions x 2 to scrotum, OTA  No additional skin breakdown  Monitor for appropriate healing      *See Care Plan and progress notes for long and short-term goals.  Barriers to Discharge: cognitive deficits that influence safety  awareness    Possible Resolutions to Barriers:  cont rehab    Discharge Planning/Teaching Needs:  HOme with wife who can provide supervision, has been here to observe in therapies.      Team Discussion:  Goals-supervision level due to cognition and memory issues. Skin issues being managed. Has urgency but aware needs to use restroom. Currently functioning at a min assist level. Wife has been here to observe in therapies.  Revisions to Treatment Plan:  None   Continued Need for Acute Rehabilitation Level of Care: The  patient requires daily medical management by a physician with specialized training in physical medicine and rehabilitation for the following conditions: Daily direction of a multidisciplinary physical rehabilitation program to ensure safe treatment while eliciting the highest outcome that is of practical value to the patient.: Yes Daily medical management of patient stability for increased activity during participation in an intensive rehabilitation regime.: Yes Daily analysis of laboratory values and/or radiology reports with any subsequent need for medication adjustment of medical intervention for : Neurological problems;Mood/behavior problems  Samarah Hogle, Gardiner Rhyme 03/15/2016, 12:31 PM

## 2016-03-16 ENCOUNTER — Inpatient Hospital Stay (HOSPITAL_COMMUNITY): Payer: Medicare Other | Admitting: Occupational Therapy

## 2016-03-16 ENCOUNTER — Inpatient Hospital Stay (HOSPITAL_COMMUNITY): Payer: Medicare Other | Admitting: Speech Pathology

## 2016-03-16 ENCOUNTER — Inpatient Hospital Stay (HOSPITAL_COMMUNITY): Payer: Medicare Other | Admitting: Physical Therapy

## 2016-03-16 ENCOUNTER — Ambulatory Visit (HOSPITAL_COMMUNITY): Payer: Medicare Other | Admitting: Occupational Therapy

## 2016-03-16 ENCOUNTER — Inpatient Hospital Stay (HOSPITAL_COMMUNITY): Admission: AD | Admit: 2016-03-16 | Payer: Medicare Other | Admitting: Critical Care Medicine

## 2016-03-16 ENCOUNTER — Inpatient Hospital Stay (HOSPITAL_COMMUNITY): Payer: Medicare Other

## 2016-03-16 LAB — BASIC METABOLIC PANEL
ANION GAP: 15 (ref 5–15)
BUN: 22 mg/dL — ABNORMAL HIGH (ref 6–20)
CALCIUM: 9.2 mg/dL (ref 8.9–10.3)
CO2: 17 mmol/L — ABNORMAL LOW (ref 22–32)
Chloride: 102 mmol/L (ref 101–111)
Creatinine, Ser: 1.82 mg/dL — ABNORMAL HIGH (ref 0.61–1.24)
GFR, EST AFRICAN AMERICAN: 40 mL/min — AB (ref >60)
GFR, EST NON AFRICAN AMERICAN: 35 mL/min — AB (ref >60)
GLUCOSE: 301 mg/dL — AB (ref 65–99)
POTASSIUM: 5 mmol/L (ref 3.5–5.1)
SODIUM: 134 mmol/L — AB (ref 135–145)

## 2016-03-16 LAB — MAGNESIUM: MAGNESIUM: 2.5 mg/dL — AB (ref 1.7–2.4)

## 2016-03-16 LAB — GLUCOSE, CAPILLARY: Glucose-Capillary: 298 mg/dL — ABNORMAL HIGH (ref 65–99)

## 2016-03-16 MED FILL — Medication: Qty: 1 | Status: AC

## 2016-03-17 NOTE — Progress Notes (Signed)
At approximately 1200 writer was informed that this patient was complaining of tightness in the mid sternal area. Writer had been informed in report that this had been occurring for several days and that an EKG showed no significant changes. P. Love, PA was notified and she stated to administer one prn sublingual nitroglycerin tablet. At 1207 the nitro tablet was administered and within 5 minutes the patient stated "the tightness has stopped". Writer frequently checked with the patient to determine if he was experiencing any further tightness, pain or shortness of breath. In each instance the patient denied any difficulty. This incident was reported to the oncoming nurse at shift change

## 2016-04-06 NOTE — Progress Notes (Signed)
Call received per floor RN at 0113 regarding Patient with acute onset mental status change. Per RN Pt staring into space with increased RR. Advised to get full set of VS, EKG and page Primary MD while RRT en route. Upon my arrival to the floor at [facility] activated in room after loss of pulse while obtaining VS. See CODE BLUE Form for Code details.

## 2016-04-06 NOTE — Code Documentation (Signed)
CODE BLUE NOTE  Patient Name: Carlos Wolf   MRN: [id number]   Date of Birth/ Sex: 1940/11/13 , male      Admission Date: 03/15/2016  Attending Provider: Charlett Blake, MD  Primary Diagnosis: R CVA    Indication: Pt was in his usual state of health until this AM, when he was noted to be in cardiac arrest. Code blue was subsequently called. At the time of arrival on scene, ACLS protocol was underway.   Technical Description:  - CPR performance duration:  60  minutes  - Was defibrillation or cardioversion used? No   - Was external pacer placed? Yes  - Was patient intubated pre/post CPR? Yes    Medications Administered: Y = Yes; Blank = No Amiodarone    Atropine    Calcium  x1  Epinephrine  x13  Lidocaine    Magnesium  x1  Norepinephrine    Phenylephrine    Sodium bicarbonate  x1  Vasopressin     Was also given D50 and insulin  Post CPR evaluation:  - Final Status - Was patient successfully resuscitated ? No   Miscellaneous Information:  - Time of death:  02:20  AM  - Primary team notified?  Yes  - Family Notified? Yes        Mercy Riding, MD   03/23/16, 2:41 AM

## 2016-04-06 NOTE — Discharge Summary (Signed)
Physician Discharge Summary/Death summary    Patient ID: Carlos Wolf MRN: [id number] DOB/AGE: Jul 10, 1941 75 y.o.  Admit date: 03/06/2016 Date of expiration:  Mar 19, 2016  Cause of Death:  Complications from CAD  Secondary diagnosis:    Stroke due to embolism of right posterior cerebral artery (HCC)   Gait disturbance, post-stroke   Cognitive deficit, post-stroke   Mobitz type 2 second degree heart block   Diabetes mellitus type 2 in nonobese (HCC)   Mobitz (type) I (Wenckebach's) atrioventricular block   Tachycardia   Coronary artery disease involving native coronary artery of native heart with angina pectoris with documented spasm [facility])    Labs:  Basic Metabolic Panel:  Recent Labs Lab 03/10/16 0521 03/13/16 0520 2016/03/19 0139  NA 138 133* 134*  K 4.0 4.2 5.0  CL 99* 99* 102  CO2 25 23 17*  GLUCOSE 137* 202* 301*  BUN 31* 17 22*  CREATININE 1.79* 1.57* 1.82*  CALCIUM 8.4* 8.4* 9.2  MG  --   --  2.5*    CBC:  Recent Labs Lab 03/10/16 0521  WBC 7.0  NEUTROABS 5.6  HGB 9.8*  HCT 29.5*  MCV 84.8  PLT 233    CBG:  Recent Labs Lab 03/15/16 0643 03/15/16 1141 03/15/16 1652 03/15/16 2051 03/19/2016 0148  GLUCAP 83 173* 95 106* 298*    Brief HPI:   Carlos Wolf is a 75 y.o. right handed male with history of HTN, T2DM, CAD s/p CABG, CKD, colon cancer who was admitted on 03/02/2016 with Chest pain. EKG with ST changes and elevated cardiac enzymes noted and he was started on IV heparin for NSTEMI. 2 D echo with EF 40-45% with severe hypokinesis of basal-midinferolateral, inferior and inferoseptal myocardium with grade 2 diastolic dysfunction. He underwent cardiac cath on 04/28 and post procedure developed confusion with agitation, slurred speech and visual deficits. C MRI brain showed acute small PCA infarcts affecting right occipital lobe, corpus callosum and pons.  On 05/01 he developed CHB with new LBB and metoprolol was discontinued. Heart block had  resolved and cardiology felt that PPM not needed and to continue monitoring. CIR was recommended due to balance and cognitive deficits.     Hospital Course: Carlos Wolf was admitted to rehab 03/19/2016 for inpatient therapies to consist of PT, ST and OT at least three hours five days a week. Past admission physiatrist, therapy team and rehab RN have worked together to provide customized collaborative inpatient rehab. Patient has had issues with poor intake and Glucerna supplements were added with meals. Diabetes was monitored with ac/hs checks and tresiba was resumed. Serial checks fo lytes showed steady improvement in renal status and metformin was resumed. Blood pressures have been stable and he was showing steady improvement in activity level. Cardiology was following closely with daily EKG to monitor for recurrent HB. He did not report any dyspnea, CP with activity,  dizziness, syncope or near syncope episodes. He did report some chest discomfort on 05/10 am that resolved with NTG.  He tolerated therapy that afternoon without any symptoms. On early am on 03-20-23, patient was found to have acute MS changes with blank stare and elevated heart rate. In process of being evaluated by RR nurse, he lost pulse and Code Blue was initiated. ACLS protocol was initiated with CPR X 60 minutes without resuscitation. Time of death was recorded at 2:20 am.      Disposition: 20-Expired   Signed: Bary Leriche 2016-03-19, 8:47 AM

## 2016-04-06 NOTE — Procedures (Signed)
Intubation Procedure Note CHAVIS ARVELO [id number] [birth year]  Procedure: Intubation Indications: Respiratory insufficiency  Procedure Details Consent: Unable to obtain consent because of emergent medical necessity. Time Out: Verified patient identification, verified procedure, site/side was marked, verified correct patient position, special equipment/implants available, medications/allergies/relevent history reviewed, required imaging and test results available.  Performed  Maximum sterile technique was used including gloves.  MAC and 4    Evaluation Hemodynamic Status: poor; O2 sats: poor Patient's Current Condition: deceased Complications: Complications of chest compressions Patient did tolerate procedure well. Chest X-ray ordered to verify placement.  CXR: pending.  Pt intubated during CPR with a 7.0 ETT at 24cm. CXR not obtained due to no pulse. Placement confirmed by BBS, ETCO2, and chest rise.   Dulcy Fanny 2016/04/12

## 2016-04-06 NOTE — Significant Event (Signed)
Kenlee.[place]iva nurse notified that patient had a sudden change of consciousness. NT obtained vitals while RN notified Rapid Response. Chest compressions started at 0110, Code initiated. Code team attempted to resuscitate patient for one hour. On call rehab PA notified by Agricultural consultant. Patient pronounced dead at 0220. Spouse in breakroom with chaplain during code. Spouse spoke with attending after patient pronounced dead. Post mortem check list completed. Support given to family.

## 2016-04-06 DEATH — deceased

## 2017-02-05 IMAGING — CT CT HEAD W/O CM
1 series · 15 of 30 positions shown, 19 images · non-contrast
Comparison: MRI of the brain May 24, 2015

CLINICAL DATA: Altered mental status, agitation. Assess cerebellar
stroke. History of hypertension, diabetes, hypercholesterolemia and
colon cancer.

EXAM:
CT HEAD WITHOUT CONTRAST
TECHNIQUE: Contiguous axial images were obtained from the base of the skull
through the vertex without intravenous contrast.

[Series 3: head 5.0 h30s · axial · 0.41mm/px · z∈[-588,-443]mm · 15 of 33 slices shown, 19 images]
[im 2/33  brain]
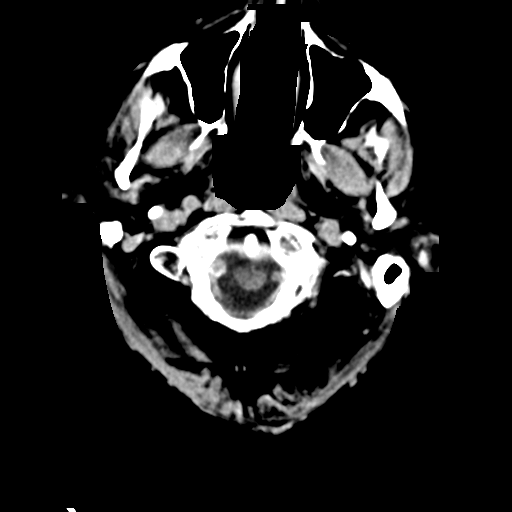
[im 2/33  bone]
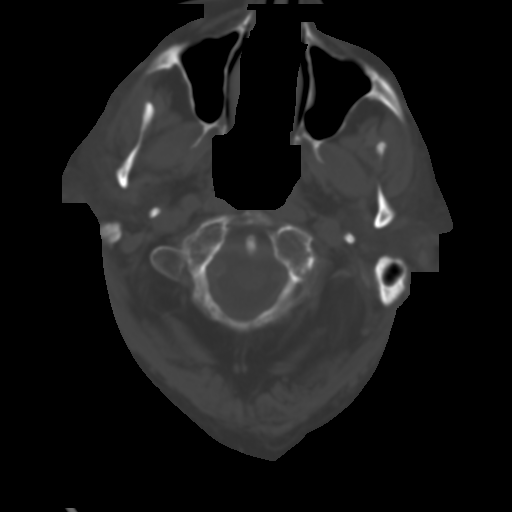
[im 4/33  brain]
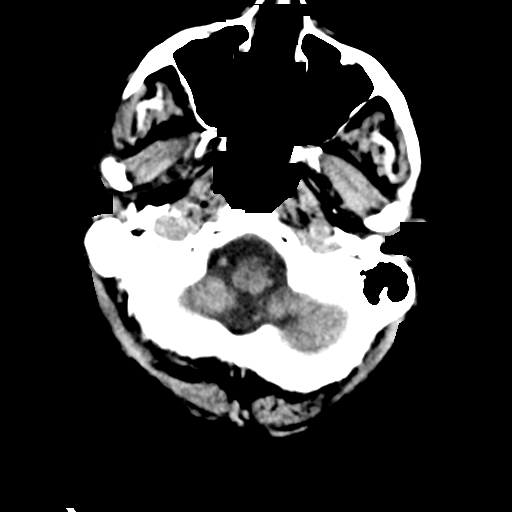
[im 6/33  brain]
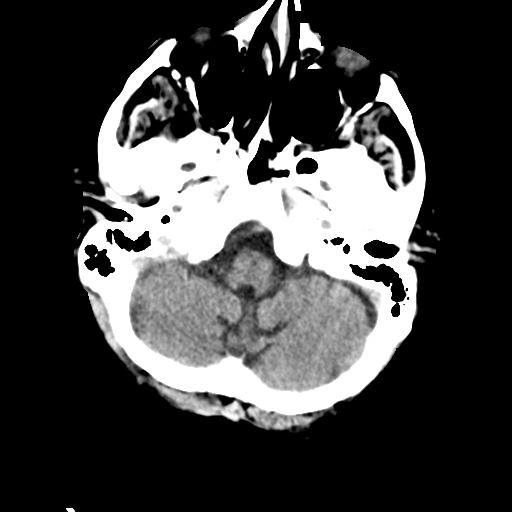
[im 8/33  brain]
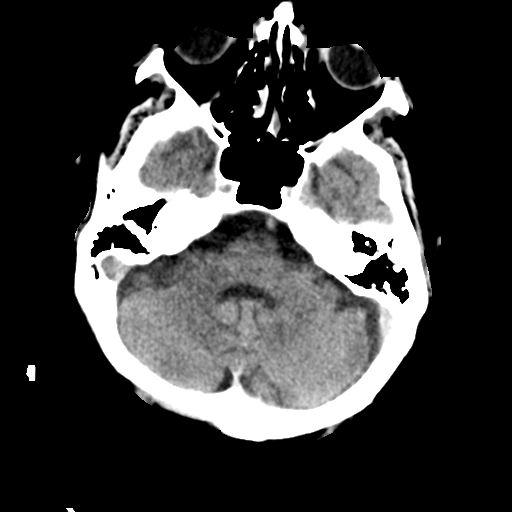
[im 10/33  brain]
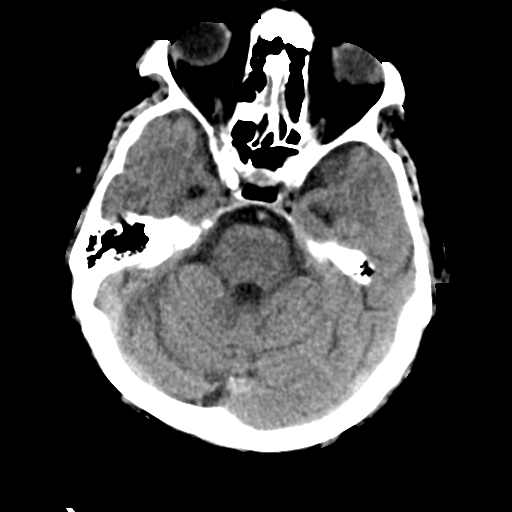
[im 10/33  bone]
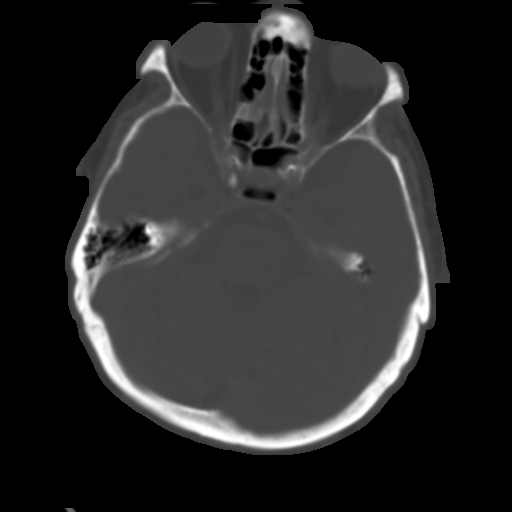
[im 13/33  brain]
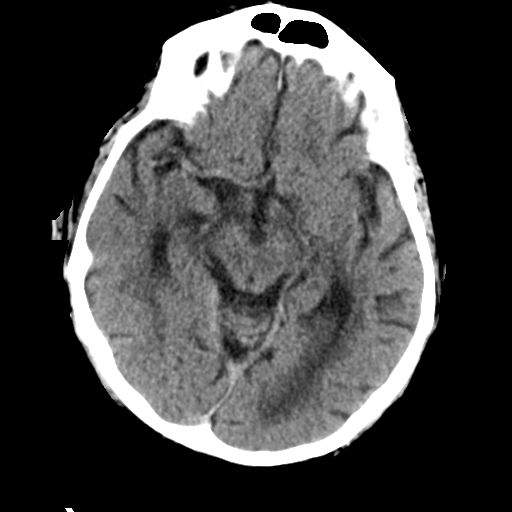
[im 15/33  brain]
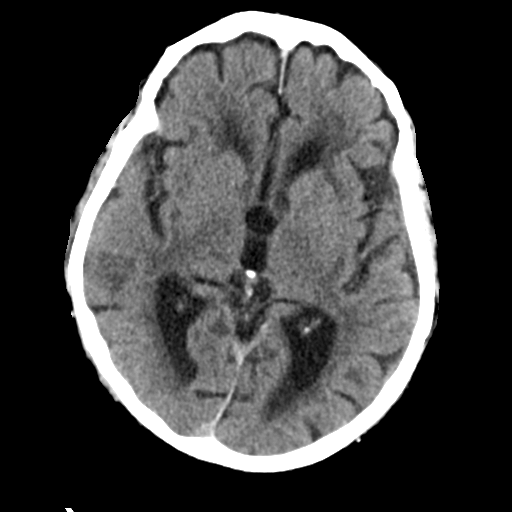
[im 17/33  brain]
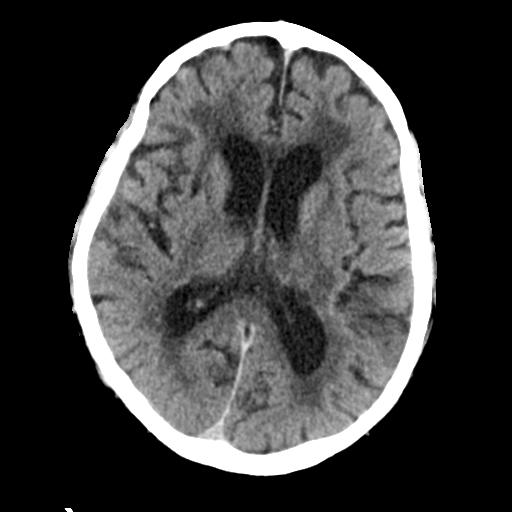
[im 18/33  brain]
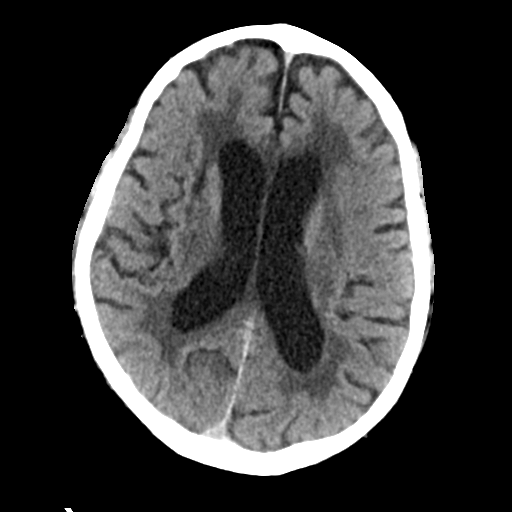
[im 18/33  bone]
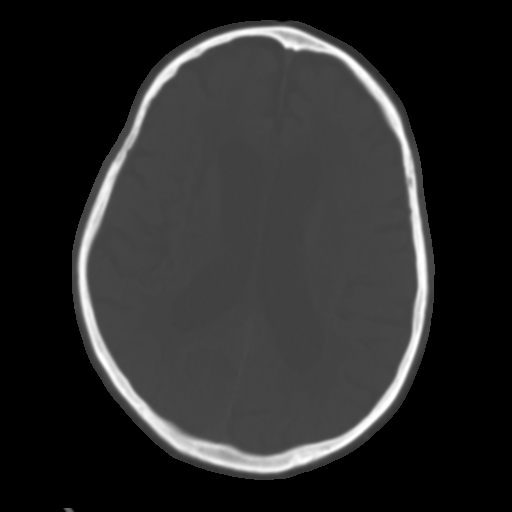
[im 20/33  brain]
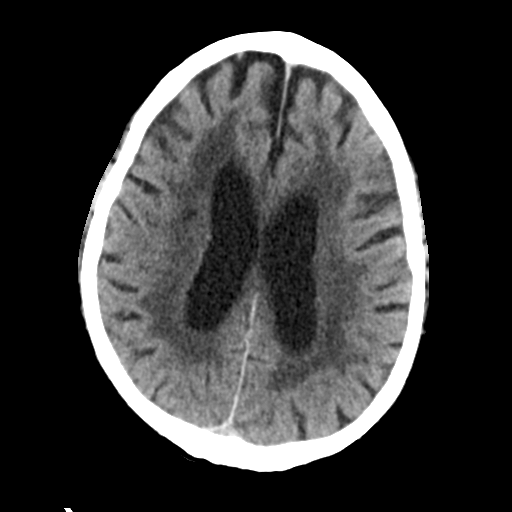
[im 23/33  brain]
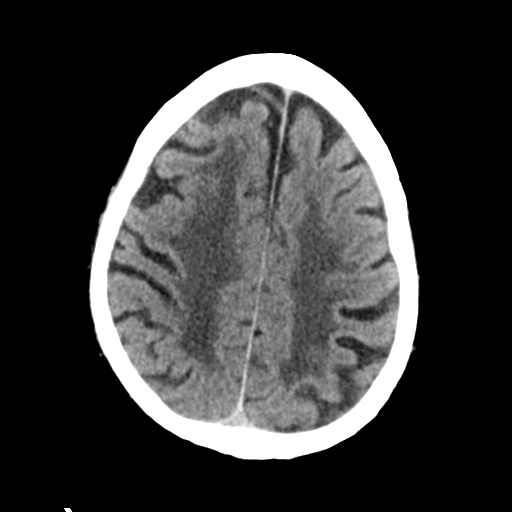
[im 25/33  brain]
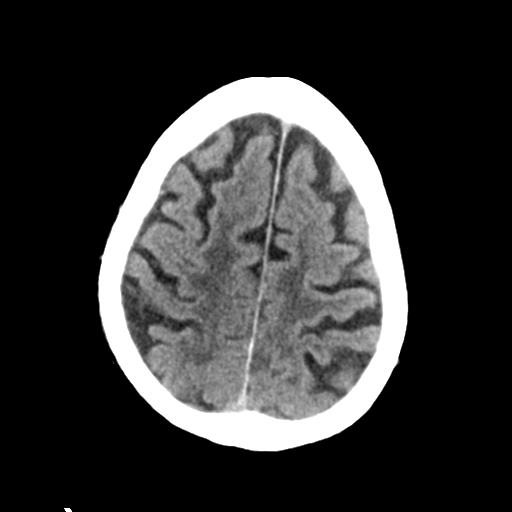
[im 27/33  brain]
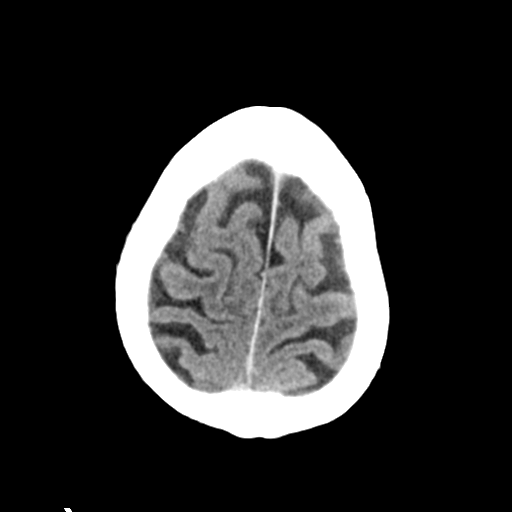
[im 27/33  bone]
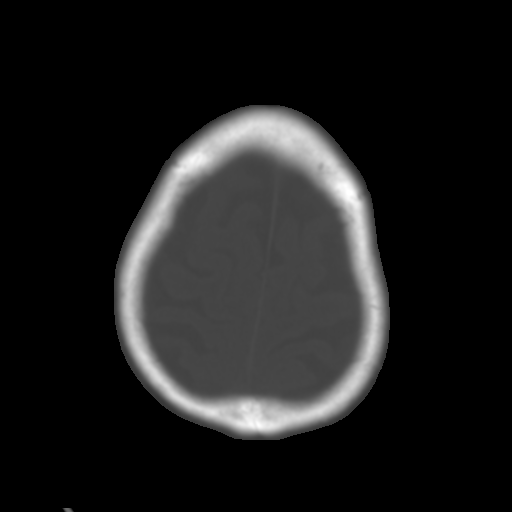
[im 29/33  brain]
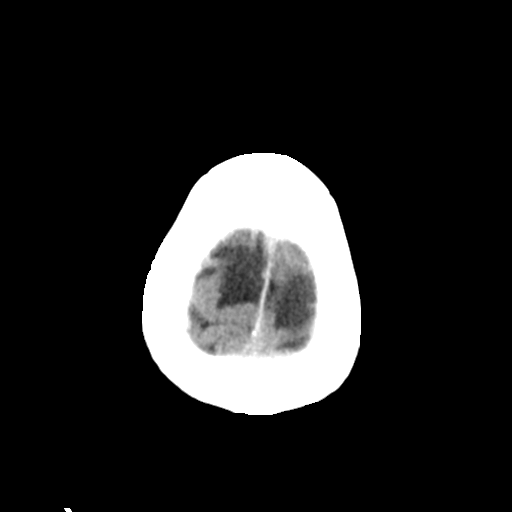
[im 31/33  brain]
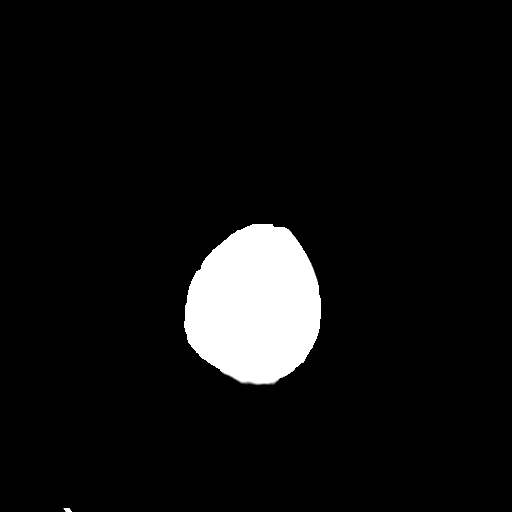

[15 of 30 positions shown; findings below may reference images not displayed]

FINDINGS: INTRACRANIAL CONTENTS: The ventricles and sulci are normal for age.
No intraparenchymal hemorrhage, mass effect nor midline shift.
Confluent supratentorial and pontine white matter hypodensities. Old
RIGHT basal ganglia and LEFT thalamus lacunar infarcts. No acute
large vascular territory infarcts. No abnormal extra-axial fluid
collections. Basal cisterns are patent. Moderate calcific
atherosclerosis of the carotid siphons.

ORBITS: The included ocular globes and orbital contents are
non-suspicious. Status post RIGHT ocular lens implant.

SINUSES: Mild paranasal sinus mucosal thickening without air-fluid
levels. Mastoid air cells are well aerated. Soft tissue within RIGHT
external auditory canal most compatible with cerumen.

SKULL/SOFT TISSUES: No skull fracture. No significant soft tissue
swelling.
IMPRESSION: No acute intracranial process.

Chronic change including severe chronic small vessel ischemic
disease and old RIGHT basal ganglia and LEFT thalamus lacunar
infarcts.
